# Patient Record
Sex: Female | Born: 1980
Health system: Southern US, Community
[De-identification: ages and names within clinical notes are randomized; demographics above are authoritative.]

## PROBLEM LIST (undated history)

## (undated) ENCOUNTER — Emergency Department: Discharge status: Absent without leave | Payer: Self-pay

## (undated) DIAGNOSIS — F32A Depression, unspecified: Secondary | ICD-10-CM

## (undated) DIAGNOSIS — E559 Vitamin D deficiency, unspecified: Secondary | ICD-10-CM

## (undated) DIAGNOSIS — M549 Dorsalgia, unspecified: Secondary | ICD-10-CM

## (undated) DIAGNOSIS — D649 Anemia, unspecified: Secondary | ICD-10-CM

## (undated) DIAGNOSIS — Z9889 Other specified postprocedural states: Secondary | ICD-10-CM

## (undated) DIAGNOSIS — F419 Anxiety disorder, unspecified: Secondary | ICD-10-CM

## (undated) DIAGNOSIS — R112 Nausea with vomiting, unspecified: Secondary | ICD-10-CM

## (undated) DIAGNOSIS — J45909 Unspecified asthma, uncomplicated: Secondary | ICD-10-CM

## (undated) DIAGNOSIS — K219 Gastro-esophageal reflux disease without esophagitis: Secondary | ICD-10-CM

## (undated) DIAGNOSIS — M797 Fibromyalgia: Secondary | ICD-10-CM

## (undated) DIAGNOSIS — Z973 Presence of spectacles and contact lenses: Secondary | ICD-10-CM

## (undated) DIAGNOSIS — M7989 Other specified soft tissue disorders: Secondary | ICD-10-CM

## (undated) DIAGNOSIS — L309 Dermatitis, unspecified: Secondary | ICD-10-CM

## (undated) DIAGNOSIS — R7303 Prediabetes: Secondary | ICD-10-CM

## (undated) DIAGNOSIS — R0602 Shortness of breath: Secondary | ICD-10-CM

## (undated) DIAGNOSIS — T8859XA Other complications of anesthesia, initial encounter: Secondary | ICD-10-CM

## (undated) DIAGNOSIS — K5900 Constipation, unspecified: Secondary | ICD-10-CM

## (undated) DIAGNOSIS — M1990 Unspecified osteoarthritis, unspecified site: Secondary | ICD-10-CM

## (undated) HISTORY — DX: Other specified soft tissue disorders: M79.89

## (undated) HISTORY — DX: Fibromyalgia: M79.7

## (undated) HISTORY — PX: WISDOM TOOTH EXTRACTION: SHX21

## (undated) HISTORY — DX: Dermatitis, unspecified: L30.9

## (undated) HISTORY — DX: Unspecified osteoarthritis, unspecified site: M19.90

## (undated) HISTORY — PX: UPPER GI ENDOSCOPY: SHX6162

## (undated) HISTORY — DX: Anemia, unspecified: D64.9

## (undated) HISTORY — DX: Constipation, unspecified: K59.00

## (undated) HISTORY — DX: Depression, unspecified: F32.A

## (undated) HISTORY — DX: Dorsalgia, unspecified: M54.9

## (undated) HISTORY — DX: Shortness of breath: R06.02

## (undated) HISTORY — DX: Prediabetes: R73.03

---

## 2016-02-17 ENCOUNTER — Encounter: Payer: Self-pay | Admitting: Emergency Medicine

## 2016-02-17 ENCOUNTER — Emergency Department (INDEPENDENT_AMBULATORY_CARE_PROVIDER_SITE_OTHER)
Admission: EM | Admit: 2016-02-17 | Discharge: 2016-02-17 | Disposition: A | Discharge status: Home / Self Care | Payer: BLUE CROSS/BLUE SHIELD | Source: Home / Self Care | Attending: Family Medicine | Admitting: Family Medicine

## 2016-02-17 DIAGNOSIS — R110 Nausea: Secondary | ICD-10-CM

## 2016-02-17 DIAGNOSIS — B9789 Other viral agents as the cause of diseases classified elsewhere: Secondary | ICD-10-CM

## 2016-02-17 DIAGNOSIS — J069 Acute upper respiratory infection, unspecified: Secondary | ICD-10-CM

## 2016-02-17 HISTORY — DX: Unspecified asthma, uncomplicated: J45.909

## 2016-02-17 MED ORDER — IPRATROPIUM BROMIDE 0.06 % NA SOLN: 2.0000 | Freq: Four times a day (QID) | NASAL | 1 refills | Status: DC

## 2016-02-17 MED ORDER — AZITHROMYCIN 250 MG PO TABS: 250.0000 mg | ORAL_TABLET | Freq: Every day | ORAL | 0 refills | Status: DC

## 2016-02-17 MED ORDER — BENZONATATE 100 MG PO CAPS: 100.0000 mg | ORAL_CAPSULE | Freq: Three times a day (TID) | ORAL | 0 refills | Status: DC

## 2016-02-17 MED ORDER — GUAIFENESIN ER 600 MG PO TB12: 600.0000 mg | ORAL_TABLET | Freq: Two times a day (BID) | ORAL | 0 refills | Status: DC | PRN

## 2016-02-17 MED ORDER — ONDANSETRON HCL 4 MG PO TABS: 4.0000 mg | ORAL_TABLET | Freq: Four times a day (QID) | ORAL | 0 refills | Status: DC

## 2016-02-17 NOTE — ED Triage Notes (Signed)
Dry cough, body aches, congestion, fever, chills x 3 days

## 2016-02-17 NOTE — ED Provider Notes (Signed)
CSN: [id number]     Arrival date & time 02/17/16  1032 History   First MD Initiated Contact with Patient 02/17/16 1213     Chief Complaint  Patient presents with  . URI   (Consider location/radiation/quality/duration/timing/severity/associated sxs/prior Treatment) HPI Kimberly Mckay is a 35 y.o. female presenting to UC with c/o dry cough, body aches, congestion, and subjective fever with chills for 3 days.  She has been taking tylenol with mild relief.  She did get the flu vaccine this year. Several others have been sick around her.  She has had nausea but no vomiting or diarrhea. Hx of asthma but denies chest pain or SOB. She has not been needing to use her inhaler.     Past Medical History:  Diagnosis Date  . Asthma    History reviewed. No pertinent surgical history. No family history on file. Social History  Substance Use Topics  . Smoking status: Never Smoker  . Smokeless tobacco: Never Used  . Alcohol use Yes   OB History    No data available     Review of Systems  Constitutional: Positive for chills and fatigue. Negative for fever.  HENT: Positive for congestion, ear pain, rhinorrhea and sore throat. Negative for trouble swallowing and voice change.   Respiratory: Positive for cough. Negative for shortness of breath.   Cardiovascular: Negative for chest pain and palpitations.  Gastrointestinal: Positive for nausea. Negative for abdominal pain, diarrhea and vomiting.  Musculoskeletal: Positive for arthralgias and myalgias. Negative for back pain.  Skin: Negative for rash.  Neurological: Positive for headaches. Negative for dizziness and light-headedness.    Allergies  Patient has no allergy information on record.  Home Medications   Prior to Admission medications   Medication Sig Start Date End Date Taking? Authorizing Provider  albuterol (ACCUNEB) 0.63 MG/3ML nebulizer solution Take 1 ampule by nebulization every 6 (six) hours as needed for wheezing.   Yes  Historical Provider, MD  ibuprofen (ADVIL,MOTRIN) 600 MG tablet Take 600 mg by mouth every 6 (six) hours as needed.   Yes Historical Provider, MD  azithromycin (ZITHROMAX) 250 MG tablet Take 1 tablet (250 mg total) by mouth daily. Take first 2 tablets together, then 1 every day until finished. 02/17/16   [name], PA-C  benzonatate (TESSALON) 100 MG capsule Take 1-2 capsules (100-200 mg total) by mouth every 8 (eight) hours. 02/17/16   [name], PA-C  guaiFENesin (MUCINEX) 600 MG 12 hr tablet Take 1-2 tablets (600-1,200 mg total) by mouth 2 (two) times daily as needed for to loosen phlegm. 02/17/16   [name], PA-C  ipratropium (ATROVENT) 0.06 % nasal spray Place 2 sprays into both nostrils 4 (four) times daily. 02/17/16   [name], PA-C  ondansetron (ZOFRAN) 4 MG tablet Take 1 tablet (4 mg total) by mouth every 6 (six) hours. 02/17/16   [name], PA-C   Meds Ordered and Administered this Visit  Medications - No data to display  BP 109/74 (BP Location: Left Arm)   Pulse 79   Temp 97.9 F (36.6 C) (Oral)   Ht 5\' 8"  (1.727 m)   Wt 219 lb (99.3 kg)   LMP 02/16/2016 (Exact Date)   SpO2 96%   BMI 33.30 kg/m  No data found.   Physical Exam  Constitutional: She appears well-developed and well-nourished. No distress.  HENT:  Head: Normocephalic and atraumatic.  Right Ear: Tympanic membrane normal.  Left Ear: Tympanic membrane normal.  Nose: Nose normal.  Mouth/Throat: Uvula is midline,  oropharynx is clear and moist and mucous membranes are normal.  Eyes: Conjunctivae are normal. No scleral icterus.  Neck: Normal range of motion. Neck supple.  Cardiovascular: Normal rate, regular rhythm and normal heart sounds.   Pulmonary/Chest: Effort normal and breath sounds normal. No stridor. No respiratory distress. She has no wheezes. She has no rales.  Musculoskeletal: Normal range of motion.  Lymphadenopathy:    She has no cervical adenopathy.  Neurological: She is  alert.  Skin: Skin is warm and dry. She is not diaphoretic.  Nursing note and vitals reviewed.   Urgent Care Course   Clinical Course     Procedures (including critical care time)  Labs Review Labs Reviewed - No data to display  Imaging Review No results found.    MDM   1. Viral URI with cough   2. Nausea    Pt c/o 3 days of URI symptoms. She did receive the flu vaccine.  No evidence of bacterial infection at this time. Encouraged symptomatic treatment.   Rx: Zofran, ipratropium nasal spray, Mucinex, tessalon and prescription to hold with expiration date for Azithromycin to take if not improving in 1 week or persistent fever >100.4*F develops. F/u with PCP in 1 week if not improving. Patient verbalized understanding and agreement with treatment plan.    [name], PA-C 02/17/16 1308

## 2016-02-17 NOTE — Discharge Instructions (Signed)
You may take 400-600mg Ibuprofen (Motrin) every 6-8 hours for fever and pain  °Alternate with Tylenol  °You may take 500mg Tylenol every 4-6 hours as needed for fever and pain  °Follow-up with your primary care provider next week for recheck of symptoms if not improving.  °Be sure to drink plenty of fluids and rest, at least 8hrs of sleep a night, preferably more while you are sick. °Return urgent care or go to closest ER if you cannot keep down fluids/signs of dehydration, fever not reducing with Tylenol, difficulty breathing/wheezing, stiff neck, worsening condition, or other concerns (see below)  ° °Your symptoms are likely due to a virus such as the common cold, however, if you developing worsening chest congestion with shortness of breath, persistent fever for 3 days, or symptoms not improving in 4-5 days, you may fill the antibiotic (azithromycin).  If you do fill the antibiotic,  please take antibiotics as prescribed and be sure to complete entire course even if you start to feel better to ensure infection does not come back. ° °

## 2016-12-27 ENCOUNTER — Encounter: Payer: Self-pay | Admitting: Emergency Medicine

## 2016-12-27 ENCOUNTER — Emergency Department (INDEPENDENT_AMBULATORY_CARE_PROVIDER_SITE_OTHER)
Admission: EM | Admit: 2016-12-27 | Discharge: 2016-12-27 | Disposition: A | Discharge status: Home / Self Care | Payer: BLUE CROSS/BLUE SHIELD | Source: Home / Self Care | Attending: Emergency Medicine | Admitting: Emergency Medicine

## 2016-12-27 DIAGNOSIS — R8281 Pyuria: Secondary | ICD-10-CM

## 2016-12-27 DIAGNOSIS — J0100 Acute maxillary sinusitis, unspecified: Secondary | ICD-10-CM | POA: Diagnosis not present

## 2016-12-27 DIAGNOSIS — R109 Unspecified abdominal pain: Secondary | ICD-10-CM

## 2016-12-27 LAB — POCT INFLUENZA A/B
INFLUENZA A, POC: NEGATIVE
INFLUENZA B, POC: NEGATIVE

## 2016-12-27 LAB — POCT CBC W AUTO DIFF (K'VILLE URGENT CARE)

## 2016-12-27 LAB — POCT URINALYSIS DIP (MANUAL ENTRY)
BILIRUBIN UA: NEGATIVE mg/dL
Bilirubin, UA: NEGATIVE
Glucose, UA: NEGATIVE mg/dL
NITRITE UA: NEGATIVE
PH UA: 5.5 (ref 5.0–8.0)
PROTEIN UA: NEGATIVE mg/dL
Spec Grav, UA: 1.025 (ref 1.010–1.025)
Urobilinogen, UA: 0.2 E.U./dL

## 2016-12-27 LAB — POCT RAPID STREP A (OFFICE): Rapid Strep A Screen: NEGATIVE

## 2016-12-27 LAB — POCT URINE PREGNANCY: PREG TEST UR: NEGATIVE

## 2016-12-27 MED ORDER — AMOXICILLIN 875 MG PO TABS: 875.0000 mg | ORAL_TABLET | Freq: Two times a day (BID) | ORAL | 0 refills | Status: DC

## 2016-12-27 NOTE — Discharge Instructions (Addendum)
Take medications as instructed. Make an appointment to be seen by the GI specialist.

## 2016-12-27 NOTE — ED Triage Notes (Signed)
Pt c/o facial pain and sore throat x2 days. Denies meds for this.

## 2016-12-27 NOTE — ED Provider Notes (Signed)
[facility]    CSN: [id number] Arrival date & time: 12/27/16  0900     History   Chief Complaint Chief Complaint  Patient presents with  . Facial Pain    HPI Kimberly Mckay is a 36 y.o. female.  Patient presents with a 2 day history of sore throat mucous body aches and facial pain. She intermittently has had discolored nasal drainage. She denies having a cough. She is unsure whether she has had a fever. She does feel achy at times. 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 HPI  Past Medical History:  Diagnosis Date  . Asthma     There are no active problems to display for this patient.   History reviewed. No pertinent surgical history.  OB History    No data available       Home Medications    Prior to Admission medications   Medication Sig Start Date End Date Taking? Authorizing Provider  albuterol (ACCUNEB) 0.63 MG/3ML nebulizer solution Take 1 ampule by nebulization every 6 (six) hours as needed for wheezing.    [provider]  azithromycin (ZITHROMAX) 250 MG tablet Take 1 tablet (250 mg total) by mouth daily. Take first 2 tablets together, then 1 every day until finished. 02/17/16   Lurene ShadowPhelps, Erin O, PA-C  benzonatate (TESSALON) 100 MG capsule Take 1-2 capsules (100-200 mg total) by mouth every 8 (eight) hours. 02/17/16   Lurene ShadowPhelps, Erin O, PA-C  guaiFENesin (MUCINEX) 600 MG 12 hr tablet Take 1-2 tablets (600-1,200 mg total) by mouth 2 (two) times daily as needed for to loosen phlegm. 02/17/16   Lurene ShadowPhelps, Erin O, PA-C  ibuprofen (ADVIL,MOTRIN) 600 MG tablet Take 600 mg by mouth every 6 (six) hours as needed.    [provider]  ipratropium (ATROVENT) 0.06 % nasal spray Place 2 sprays into both nostrils 4 (four) times daily. 02/17/16   Lurene ShadowPhelps, Erin O, PA-C  ondansetron (ZOFRAN) 4 MG tablet Take 1 tablet (4 mg total) by mouth every 6 (six) hours. 02/17/16   Lurene ShadowPhelps, Erin O, PA-C    Family  History History reviewed. No pertinent family history.  Social History Social History   Tobacco Use  . Smoking status: Former Smoker    Types: Cigarettes  . Smokeless tobacco: Never Used  Substance Use Topics  . Alcohol use: Yes  . Drug use: No     Allergies   Patient has no allergy information on record.   Review of Systems Review of Systems  Constitutional: Positive for fatigue. Negative for fever.  HENT: Positive for congestion, sinus pressure, sinus pain and sore throat.   Eyes: Negative.   Respiratory: Negative.   Cardiovascular: Negative.   Gastrointestinal: Positive for abdominal pain. Negative for blood in stool, constipation, diarrhea and nausea.  Genitourinary: Negative.      Physical Exam Triage Vital Signs ED Triage Vitals  Enc Vitals Group     BP 12/27/16 0933 93/68     Pulse Rate 12/27/16 0933 68     Resp --      Temp 12/27/16 0933 98.1 F (36.7 C)     Temp Source 12/27/16 0933 Oral     SpO2 12/27/16 0933 98 %     Weight 12/27/16 0933 243 lb (110.2 kg)     Height --      Head Circumference --      Peak Flow --      Pain Score 12/27/16 0934  2     Pain Loc --      Pain Edu? --      Excl. in GC? --    No data found.  Updated Vital Signs BP 124/78 (BP Location: Right Arm)   Pulse 88   Temp 98.1 F (36.7 C) (Oral)   Wt 243 lb (110.2 kg)   LMP 12/07/2016 (Exact Date)   SpO2 98%   BMI 36.95 kg/m   Visual Acuity Right Eye Distance:   Left Eye Distance:   Bilateral Distance:    Right Eye Near:   Left Eye Near:    Bilateral Near:     Physical Exam  Constitutional: She appears well-developed and well-nourished.  HENT:  Nose: Nose normal.  Mouth/Throat: Oropharynx is clear and moist.  Neck: Normal range of motion. Neck supple. No thyromegaly present.  Cardiovascular: Normal rate and regular rhythm.  No murmur heard. Pulmonary/Chest: Effort normal and breath sounds normal. No respiratory distress.  Abdominal:  Abdomen is soft. There  is mild right upper abdominal discomfort and mild left midabdominal discomfort. There is no distention.     UC Treatments / Results  Labs (all labs ordered are listed, but only abnormal results are displayed) Labs Reviewed  POCT URINALYSIS DIP (MANUAL ENTRY) - Abnormal; Notable for the following components:      Result Value   Blood, UA small (*)    Leukocytes, UA Small (1+) (*)    All other components within normal limits  URINE CULTURE  POCT CBC W AUTO DIFF ([facility])  POCT URINE PREGNANCY  POCT RAPID STREP A (OFFICE)  POCT INFLUENZA A/B    EKG  EKG Interpretation None       Radiology No results found.  Procedures Procedures (including critical care time)  Medications Ordered in UC Medications - No data to display   Initial Impression / Assessment and Plan / UC Course  I have reviewed the triage vital signs and the nursing notes.  Pertinent labs & imaging results that were available during my care of the patient were reviewed by me and considered in my medical decision making (see chart for details).   Patient presents with symptoms and signs consistent with acute sinusitis. She will be treated with amoxicillin for 7 days along with Mucinex to take twice a day. She has diffuse abdominal discomfort. She has seen GI before for a stricture and notion she needs to go back. Her white cell count here today is normal. She will make her own appointment with GI. There was small blood and leukocytes on her analysis and culture was sent.    Final Clinical Impressions(s) / UC Diagnoses   Final diagnoses:  Acute non-recurrent maxillary sinusitis  Abdominal pain in female  Pyuria    New Prescriptions This SmartLink is deprecated. Use AVSMEDLIST instead to display the medication list for a patient.   Controlled Substance Prescriptions  Controlled Substance Registry consulted? Not Applicable   Collene Gobble, MD 12/27/16 1225

## 2016-12-28 ENCOUNTER — Telehealth: Payer: Self-pay | Admitting: *Deleted

## 2016-12-28 LAB — URINE CULTURE
MICRO NUMBER:: 81239552
SPECIMEN QUALITY: ADEQUATE

## 2016-12-28 NOTE — Telephone Encounter (Signed)
Spoke to pt given Ucx results. She reports that she is feeling some better today. Advised her if she develops more urinary s/s she could come back to recollect and send another Ucx, but as long as she continues to improve no additional testing is needed. Pt verbalized understanding.

## 2018-11-06 DIAGNOSIS — F331 Major depressive disorder, recurrent, moderate: Secondary | ICD-10-CM | POA: Diagnosis not present

## 2018-11-06 DIAGNOSIS — F411 Generalized anxiety disorder: Secondary | ICD-10-CM | POA: Diagnosis not present

## 2018-11-13 DIAGNOSIS — F411 Generalized anxiety disorder: Secondary | ICD-10-CM | POA: Diagnosis not present

## 2018-11-13 DIAGNOSIS — F331 Major depressive disorder, recurrent, moderate: Secondary | ICD-10-CM | POA: Diagnosis not present

## 2018-11-20 DIAGNOSIS — F411 Generalized anxiety disorder: Secondary | ICD-10-CM | POA: Diagnosis not present

## 2018-11-20 DIAGNOSIS — F331 Major depressive disorder, recurrent, moderate: Secondary | ICD-10-CM | POA: Diagnosis not present

## 2018-11-27 DIAGNOSIS — F411 Generalized anxiety disorder: Secondary | ICD-10-CM | POA: Diagnosis not present

## 2018-11-27 DIAGNOSIS — F331 Major depressive disorder, recurrent, moderate: Secondary | ICD-10-CM | POA: Diagnosis not present

## 2018-11-28 DIAGNOSIS — K6289 Other specified diseases of anus and rectum: Secondary | ICD-10-CM | POA: Diagnosis not present

## 2018-11-28 DIAGNOSIS — K645 Perianal venous thrombosis: Secondary | ICD-10-CM | POA: Diagnosis not present

## 2018-12-04 DIAGNOSIS — F331 Major depressive disorder, recurrent, moderate: Secondary | ICD-10-CM | POA: Diagnosis not present

## 2018-12-04 DIAGNOSIS — F411 Generalized anxiety disorder: Secondary | ICD-10-CM | POA: Diagnosis not present

## 2018-12-11 DIAGNOSIS — F331 Major depressive disorder, recurrent, moderate: Secondary | ICD-10-CM | POA: Diagnosis not present

## 2018-12-11 DIAGNOSIS — F411 Generalized anxiety disorder: Secondary | ICD-10-CM | POA: Diagnosis not present

## 2018-12-18 DIAGNOSIS — F331 Major depressive disorder, recurrent, moderate: Secondary | ICD-10-CM | POA: Diagnosis not present

## 2018-12-18 DIAGNOSIS — F411 Generalized anxiety disorder: Secondary | ICD-10-CM | POA: Diagnosis not present

## 2018-12-25 DIAGNOSIS — F331 Major depressive disorder, recurrent, moderate: Secondary | ICD-10-CM | POA: Diagnosis not present

## 2018-12-25 DIAGNOSIS — F411 Generalized anxiety disorder: Secondary | ICD-10-CM | POA: Diagnosis not present

## 2019-01-01 DIAGNOSIS — F411 Generalized anxiety disorder: Secondary | ICD-10-CM | POA: Diagnosis not present

## 2019-01-01 DIAGNOSIS — F331 Major depressive disorder, recurrent, moderate: Secondary | ICD-10-CM | POA: Diagnosis not present

## 2019-01-08 DIAGNOSIS — F331 Major depressive disorder, recurrent, moderate: Secondary | ICD-10-CM | POA: Diagnosis not present

## 2019-01-08 DIAGNOSIS — F411 Generalized anxiety disorder: Secondary | ICD-10-CM | POA: Diagnosis not present

## 2019-01-25 DIAGNOSIS — F411 Generalized anxiety disorder: Secondary | ICD-10-CM | POA: Diagnosis not present

## 2019-01-25 DIAGNOSIS — F331 Major depressive disorder, recurrent, moderate: Secondary | ICD-10-CM | POA: Diagnosis not present

## 2019-01-29 DIAGNOSIS — F411 Generalized anxiety disorder: Secondary | ICD-10-CM | POA: Diagnosis not present

## 2019-01-29 DIAGNOSIS — F331 Major depressive disorder, recurrent, moderate: Secondary | ICD-10-CM | POA: Diagnosis not present

## 2019-02-05 DIAGNOSIS — F411 Generalized anxiety disorder: Secondary | ICD-10-CM | POA: Diagnosis not present

## 2019-02-05 DIAGNOSIS — F331 Major depressive disorder, recurrent, moderate: Secondary | ICD-10-CM | POA: Diagnosis not present

## 2019-02-26 DIAGNOSIS — F331 Major depressive disorder, recurrent, moderate: Secondary | ICD-10-CM | POA: Diagnosis not present

## 2019-02-26 DIAGNOSIS — F411 Generalized anxiety disorder: Secondary | ICD-10-CM | POA: Diagnosis not present

## 2019-04-23 DIAGNOSIS — U071 COVID-19: Secondary | ICD-10-CM

## 2019-04-23 HISTORY — DX: COVID-19: U07.1

## 2019-05-01 DIAGNOSIS — D1801 Hemangioma of skin and subcutaneous tissue: Secondary | ICD-10-CM | POA: Diagnosis not present

## 2019-05-01 DIAGNOSIS — D485 Neoplasm of uncertain behavior of skin: Secondary | ICD-10-CM | POA: Diagnosis not present

## 2019-05-02 DIAGNOSIS — M791 Myalgia, unspecified site: Secondary | ICD-10-CM | POA: Diagnosis not present

## 2019-05-02 DIAGNOSIS — R439 Unspecified disturbances of smell and taste: Secondary | ICD-10-CM | POA: Diagnosis not present

## 2019-05-02 DIAGNOSIS — U071 COVID-19: Secondary | ICD-10-CM | POA: Diagnosis not present

## 2019-05-02 DIAGNOSIS — R0981 Nasal congestion: Secondary | ICD-10-CM | POA: Diagnosis not present

## 2019-10-24 ENCOUNTER — Emergency Department: Admit: 2019-10-24 | Discharge status: Absent without leave | Payer: Self-pay

## 2019-10-24 ENCOUNTER — Other Ambulatory Visit: Payer: Self-pay

## 2019-10-24 ENCOUNTER — Emergency Department (INDEPENDENT_AMBULATORY_CARE_PROVIDER_SITE_OTHER)
Admission: EM | Admit: 2019-10-24 | Discharge: 2019-10-24 | Disposition: A | Discharge status: Home / Self Care | Payer: BC Managed Care – PPO | Source: Home / Self Care | Attending: Family Medicine | Admitting: Family Medicine

## 2019-10-24 ENCOUNTER — Encounter: Payer: Self-pay | Admitting: Emergency Medicine

## 2019-10-24 DIAGNOSIS — R300 Dysuria: Secondary | ICD-10-CM

## 2019-10-24 DIAGNOSIS — R110 Nausea: Secondary | ICD-10-CM

## 2019-10-24 DIAGNOSIS — N390 Urinary tract infection, site not specified: Secondary | ICD-10-CM

## 2019-10-24 HISTORY — DX: Anxiety disorder, unspecified: F41.9

## 2019-10-24 LAB — POCT URINALYSIS DIP (MANUAL ENTRY)
Bilirubin, UA: NEGATIVE
Glucose, UA: NEGATIVE mg/dL
Ketones, POC UA: NEGATIVE mg/dL
Nitrite, UA: NEGATIVE
Protein Ur, POC: NEGATIVE mg/dL
Spec Grav, UA: 1.025 (ref 1.010–1.025)
Urobilinogen, UA: 0.2 E.U./dL
pH, UA: 5 (ref 5.0–8.0)

## 2019-10-24 MED ORDER — FLUCONAZOLE 200 MG PO TABS: 200.0000 mg | ORAL_TABLET | Freq: Every day | ORAL | 0 refills | Status: AC

## 2019-10-24 MED ORDER — ONDANSETRON HCL 4 MG PO TABS: 4.0000 mg | ORAL_TABLET | Freq: Four times a day (QID) | ORAL | 0 refills | Status: DC

## 2019-10-24 MED ORDER — CEFDINIR 300 MG PO CAPS
300.0000 mg | ORAL_CAPSULE | Freq: Two times a day (BID) | ORAL | 0 refills | Status: DC
Start: 2019-10-24 — End: 2020-03-20

## 2019-10-24 NOTE — ED Triage Notes (Signed)
Patient here with varied symptoms; nausea and some dizziness; back pain; perineal irritation for past 4 days. Had covid in 3/21.

## 2019-10-24 NOTE — ED Provider Notes (Signed)
[facility]    CSN: [id number] Arrival date & time: 10/24/19  1728      History   Chief Complaint Chief Complaint  Patient presents with  . Nausea    HPI Kimberly Mckay is a 39 y.o. female.   Patient complains of nausea and dizziness.  Also has some urinary frequency and dysuria.  She had Covid March of this year.  She was also treated for BV several months ago  HPI  Past Medical History:  Diagnosis Date  . Anxiety   . Asthma   . COVID-19 04/2019    There are no problems to display for this patient.   History reviewed. No pertinent surgical history.  OB History   No obstetric history on file.      Home Medications    Prior to Admission medications   Medication Sig Start Date End Date Taking? Authorizing Provider  ALPRAZolam Prudy Feeler) 0.5 MG tablet Take 0.5 mg by mouth at bedtime as needed for anxiety.   Yes [provider]  albuterol (ACCUNEB) 0.63 MG/3ML nebulizer solution Take 1 ampule by nebulization every 6 (six) hours as needed for wheezing.    [provider]  amoxicillin (AMOXIL) 875 MG tablet Take 1 tablet (875 mg total) 2 (two) times daily by mouth. 12/27/16   Collene Gobble, MD  azithromycin (ZITHROMAX) 250 MG tablet Take 1 tablet (250 mg total) by mouth daily. Take first 2 tablets together, then 1 every day until finished. 02/17/16   Lurene Shadow, PA-C  benzonatate (TESSALON) 100 MG capsule Take 1-2 capsules (100-200 mg total) by mouth every 8 (eight) hours. 02/17/16   Lurene Shadow, PA-C  guaiFENesin (MUCINEX) 600 MG 12 hr tablet Take 1-2 tablets (600-1,200 mg total) by mouth 2 (two) times daily as needed for to loosen phlegm. 02/17/16   Lurene Shadow, PA-C  ibuprofen (ADVIL,MOTRIN) 600 MG tablet Take 600 mg by mouth every 6 (six) hours as needed.    [provider]  ipratropium (ATROVENT) 0.06 % nasal spray Place 2 sprays into both nostrils 4 (four) times daily. 02/17/16   Lurene Shadow, PA-C  ondansetron  (ZOFRAN) 4 MG tablet Take 1 tablet (4 mg total) by mouth every 6 (six) hours. 02/17/16   Lurene Shadow, PA-C    Family History No family history on file.  Social History Social History   Tobacco Use  . Smoking status: Former Smoker    Types: Cigarettes  . Smokeless tobacco: Never Used  Vaping Use  . Vaping Use: Never assessed  Substance Use Topics  . Alcohol use: Yes  . Drug use: No     Allergies   Patient has no allergy information on record.   Review of Systems Review of Systems  Gastrointestinal: Positive for nausea.  Genitourinary: Positive for dysuria and vaginal discharge.  Neurological: Positive for dizziness.  All other systems reviewed and are negative.    Physical Exam Triage Vital Signs ED Triage Vitals  Enc Vitals Group     BP 10/24/19 1825 117/79     Pulse Rate 10/24/19 1825 62     Resp 10/24/19 1825 16     Temp 10/24/19 1825 98.4 F (36.9 C)     Temp Source 10/24/19 1825 Oral     SpO2 10/24/19 1825 96 %     Weight 10/24/19 1826 237 lb (107.5 kg)     Height 10/24/19 1826 5\' 7"  (1.702 m)     Head Circumference --  Peak Flow --      Pain Score 10/24/19 1825 6     Pain Loc --      Pain Edu? --      Excl. in GC? --    No data found.  Updated Vital Signs BP 117/79 (BP Location: Right Arm)   Pulse 62   Temp 98.4 F (36.9 C) (Oral)   Resp 16   Ht 5\' 7"  (1.702 m)   Wt 107.5 kg   LMP 10/17/2019 (Approximate)   SpO2 96%   BMI 37.12 kg/m   Visual Acuity Right Eye Distance:   Left Eye Distance:   Bilateral Distance:    Right Eye Near:   Left Eye Near:    Bilateral Near:     Physical Exam Vitals and nursing note reviewed.  Constitutional:      Appearance: Normal appearance. She is obese.  HENT:     Head: Normocephalic.     Mouth/Throat:     Mouth: Mucous membranes are moist.  Cardiovascular:     Rate and Rhythm: Normal rate and regular rhythm.  Pulmonary:     Effort: Pulmonary effort is normal.     Breath sounds: Normal  breath sounds.  Abdominal:     Palpations: Abdomen is soft.  Neurological:     General: No focal deficit present.     Mental Status: She is alert and oriented to person, place, and time.      UC Treatments / Results  Labs (all labs ordered are listed, but only abnormal results are displayed) Labs Reviewed  POCT URINALYSIS DIP (MANUAL ENTRY) - Abnormal; Notable for the following components:      Result Value   Clarity, UA cloudy (*)    Blood, UA trace-lysed (*)    Leukocytes, UA Small (1+) (*)    All other components within normal limits  URINE CULTURE    EKG   Radiology No results found.  Procedures Procedures (including critical care time)  Medications Ordered in UC Medications - No data to display  Initial Impression / Assessment and Plan / UC Course  I have reviewed the triage vital signs and the nursing notes.  Pertinent labs & imaging results that were available during my care of the patient were reviewed by me and considered in my medical decision making (see chart for details).     UTI.  We will culture urine and begin Omnicef pending results of culture There is some tenderness of the sinuses and drainage which might become attributing and affect are related to the nausea. Final Clinical Impressions(s) / UC Diagnoses   Final diagnoses:  Dysuria   Discharge Instructions   None    ED Prescriptions    None     PDMP not reviewed this encounter.   10/19/2019, MD 10/24/19 12/24/19

## 2019-10-26 LAB — URINE CULTURE
MICRO NUMBER:: 10904819
SPECIMEN QUALITY:: ADEQUATE

## 2019-10-30 ENCOUNTER — Telehealth: Payer: Self-pay

## 2019-10-30 DIAGNOSIS — Z124 Encounter for screening for malignant neoplasm of cervix: Secondary | ICD-10-CM | POA: Diagnosis not present

## 2019-10-30 DIAGNOSIS — N898 Other specified noninflammatory disorders of vagina: Secondary | ICD-10-CM | POA: Diagnosis not present

## 2019-10-30 DIAGNOSIS — R300 Dysuria: Secondary | ICD-10-CM | POA: Diagnosis not present

## 2019-10-30 NOTE — Telephone Encounter (Signed)
Returning pts VM left at [facility].. Wanted ucx results since she finished antibiotics and is still feeling bad. Advised to f/u with PCP for recheck of sxs. Pt acknowledges.

## 2019-11-17 DIAGNOSIS — L237 Allergic contact dermatitis due to plants, except food: Secondary | ICD-10-CM | POA: Diagnosis not present

## 2020-01-23 DIAGNOSIS — M5442 Lumbago with sciatica, left side: Secondary | ICD-10-CM | POA: Diagnosis not present

## 2020-01-23 DIAGNOSIS — M7918 Myalgia, other site: Secondary | ICD-10-CM | POA: Diagnosis not present

## 2020-01-23 DIAGNOSIS — M9904 Segmental and somatic dysfunction of sacral region: Secondary | ICD-10-CM | POA: Diagnosis not present

## 2020-01-23 DIAGNOSIS — M9905 Segmental and somatic dysfunction of pelvic region: Secondary | ICD-10-CM | POA: Diagnosis not present

## 2020-01-23 DIAGNOSIS — M9903 Segmental and somatic dysfunction of lumbar region: Secondary | ICD-10-CM | POA: Diagnosis not present

## 2020-02-11 DIAGNOSIS — M5459 Other low back pain: Secondary | ICD-10-CM | POA: Diagnosis not present

## 2020-03-17 ENCOUNTER — Ambulatory Visit (INDEPENDENT_AMBULATORY_CARE_PROVIDER_SITE_OTHER): Payer: BC Managed Care – PPO | Admitting: Family Medicine

## 2020-03-20 ENCOUNTER — Other Ambulatory Visit: Payer: Self-pay

## 2020-03-20 ENCOUNTER — Encounter (INDEPENDENT_AMBULATORY_CARE_PROVIDER_SITE_OTHER): Payer: Self-pay | Admitting: Family Medicine

## 2020-03-20 ENCOUNTER — Ambulatory Visit (INDEPENDENT_AMBULATORY_CARE_PROVIDER_SITE_OTHER): Payer: BC Managed Care – PPO | Admitting: Family Medicine

## 2020-03-20 VITALS — BP 109/77 | HR 60 | Temp 97.9°F | Ht 67.0 in | Wt 254.0 lb

## 2020-03-20 DIAGNOSIS — K5909 Other constipation: Secondary | ICD-10-CM

## 2020-03-20 DIAGNOSIS — R0602 Shortness of breath: Secondary | ICD-10-CM | POA: Diagnosis not present

## 2020-03-20 DIAGNOSIS — Z9189 Other specified personal risk factors, not elsewhere classified: Secondary | ICD-10-CM

## 2020-03-20 DIAGNOSIS — D649 Anemia, unspecified: Secondary | ICD-10-CM | POA: Diagnosis not present

## 2020-03-20 DIAGNOSIS — Z6839 Body mass index (BMI) 39.0-39.9, adult: Secondary | ICD-10-CM

## 2020-03-20 DIAGNOSIS — F39 Unspecified mood [affective] disorder: Secondary | ICD-10-CM | POA: Diagnosis not present

## 2020-03-20 DIAGNOSIS — R7303 Prediabetes: Secondary | ICD-10-CM

## 2020-03-20 DIAGNOSIS — K5900 Constipation, unspecified: Secondary | ICD-10-CM | POA: Diagnosis not present

## 2020-03-20 DIAGNOSIS — R5383 Other fatigue: Secondary | ICD-10-CM | POA: Diagnosis not present

## 2020-03-20 DIAGNOSIS — Z0289 Encounter for other administrative examinations: Secondary | ICD-10-CM

## 2020-03-21 LAB — CBC WITH DIFFERENTIAL/PLATELET
Basophils Absolute: 0 10*3/uL (ref 0.0–0.2)
Basos: 1 %
EOS (ABSOLUTE): 0.1 10*3/uL (ref 0.0–0.4)
Eos: 2 %
Hematocrit: 41.7 % (ref 34.0–46.6)
Hemoglobin: 13.9 g/dL (ref 11.1–15.9)
Immature Grans (Abs): 0 10*3/uL (ref 0.0–0.1)
Immature Granulocytes: 0 %
Lymphocytes Absolute: 1.1 10*3/uL (ref 0.7–3.1)
Lymphs: 24 %
MCH: 28.3 pg (ref 26.6–33.0)
MCHC: 33.3 g/dL (ref 31.5–35.7)
MCV: 85 fL (ref 79–97)
Monocytes Absolute: 0.6 10*3/uL (ref 0.1–0.9)
Monocytes: 12 %
Neutrophils Absolute: 2.9 10*3/uL (ref 1.4–7.0)
Neutrophils: 61 %
Platelets: 201 10*3/uL (ref 150–450)
RBC: 4.92 x10E6/uL (ref 3.77–5.28)
RDW: 13.5 % (ref 11.7–15.4)
WBC: 4.7 10*3/uL (ref 3.4–10.8)

## 2020-03-21 LAB — COMPREHENSIVE METABOLIC PANEL
ALT: 8 IU/L (ref 0–32)
AST: 18 IU/L (ref 0–40)
Albumin/Globulin Ratio: 1.2 (ref 1.2–2.2)
Albumin: 3.7 g/dL — ABNORMAL LOW (ref 3.8–4.8)
Alkaline Phosphatase: 101 IU/L (ref 44–121)
BUN/Creatinine Ratio: 14 (ref 9–23)
BUN: 11 mg/dL (ref 6–20)
Bilirubin Total: <0.2 mg/dL (ref 0.0–1.2)
CO2: 24 mmol/L (ref 20–29)
Calcium: 9 mg/dL (ref 8.7–10.2)
Chloride: 104 mmol/L (ref 96–106)
Creatinine, Ser: 0.81 mg/dL (ref 0.57–1.00)
GFR calc Af Amer: 107 mL/min/{1.73_m2} (ref >59)
GFR calc non Af Amer: 92 mL/min/{1.73_m2} (ref >59)
Globulin, Total: 3 g/dL (ref 1.5–4.5)
Glucose: 78 mg/dL (ref 65–99)
Potassium: 4.1 mmol/L (ref 3.5–5.2)
Sodium: 140 mmol/L (ref 134–144)
Total Protein: 6.7 g/dL (ref 6.0–8.5)

## 2020-03-21 LAB — LIPID PANEL
Chol/HDL Ratio: 2.8 ratio (ref 0.0–4.4)
Cholesterol, Total: 153 mg/dL (ref 100–199)
HDL: 55 mg/dL (ref >39)
LDL Chol Calc (NIH): 84 mg/dL (ref 0–99)
Triglycerides: 72 mg/dL (ref 0–149)
VLDL Cholesterol Cal: 14 mg/dL (ref 5–40)

## 2020-03-21 LAB — VITAMIN B12: Vitamin B-12: 353 pg/mL (ref 232–1245)

## 2020-03-21 LAB — VITAMIN D 25 HYDROXY (VIT D DEFICIENCY, FRACTURES): Vit D, 25-Hydroxy: 19.7 ng/mL — ABNORMAL LOW (ref 30.0–100.0)

## 2020-03-21 LAB — HEMOGLOBIN A1C
Est. average glucose Bld gHb Est-mCnc: 111 mg/dL
Hgb A1c MFr Bld: 5.5 % (ref 4.8–5.6)

## 2020-03-21 LAB — TSH: TSH: 1.77 u[IU]/mL (ref 0.450–4.500)

## 2020-03-21 LAB — INSULIN, RANDOM: INSULIN: 16.2 u[IU]/mL (ref 2.6–24.9)

## 2020-03-21 LAB — T4, FREE: Free T4: 1.22 ng/dL (ref 0.82–1.77)

## 2020-03-21 LAB — T3: T3, Total: 114 ng/dL (ref 71–180)

## 2020-03-21 LAB — FOLATE: Folate: 4.6 ng/mL (ref >3.0)

## 2020-03-24 NOTE — Progress Notes (Signed)
Dear Dr. Venita Lick,   Thank you for referring Chihiro Ganzer to our clinic. The following note includes my evaluation and treatment recommendations.  Chief Complaint:   OBESITY Kimberly Mckay (MR# [id number]) is a 40 y.o. female who presents for evaluation and treatment of obesity and related comorbidities. Current BMI is Body mass index is 39.78 kg/m. Reyanna has been struggling with her weight for many years and has been unsuccessful in either losing weight, maintaining weight loss, or reaching her healthy weight goal.  Bethyl is currently in the action stage of change and ready to dedicate time achieving and maintaining a healthier weight. Jonay is interested in becoming our patient and working on intensive lifestyle modifications including (but not limited to) diet and exercise for weight loss.  Ahlana works full-time. She lives with her husband, Viviann Spare, and 2 kids, ages 81 and 68. She eats out 5 days a week. She craves carbs, cookies, chocolate, chips, and pizza. She skips breakfast 5 days a week on average. I feel hungry all the time.  Ellan's habits were reviewed today and are as follows: Her family eats meals together, her desired weight loss is 104 lbs, she started gaining weight after high school, her heaviest weight ever was 270 pounds, she has significant food cravings issues, she snacks frequently in the evenings, she skips meals frequently, she is frequently drinking liquids with calories, she frequently makes poor food choices, she has problems with excessive hunger, she frequently eats larger portions than normal, she has binge eating behaviors and she struggles with emotional eating.  This is the patient's first visit at [facility].  The patient's NEW PATIENT PACKET that they filled out prior to today's office visit was reviewed at length and information from that paperwork was included within the following office visit note.     Included in the packet: current and past health history, medications, allergies, ROS, gynecologic history (women only), surgical history, family history, social history, weight history, weight loss surgery history (for those that have had weight loss surgery), nutritional evaluation, mood and food questionnaire along with a depression screening (PHQ9) on all patients, an Epworth questionnaire, sleep habits questionnaire, patient life and health improvement goals questionnaire. These will all be scanned into the patient's chart under media.   During the visit, I independently reviewed the patient's EKG, bioimpedance scale results, and indirect calorimeter results. I used this information to tailor a meal plan for the patient that will help Zuriah to lose weight and will improve her obesity-related conditions going forward.  I performed a medically necessary appropriate examination and/or evaluation. I discussed the assessment and treatment plan with the patient. The patient was provided an opportunity to ask questions and all were answered. The patient agreed with the plan and demonstrated an understanding of the instructions. Labs were ordered today (unless patient declined them) and will be reviewed with the patient at our next visit unless more critical results need to be addressed immediately. Clinical information was updated and documented in the EMR.  Time spent on visit including pre-visit chart review and post-visit care was estimated to be 60 minutes  Depression Screen Lynde's Food and Mood (modified PHQ-9) score was 23.  Depression screen [facility] 2/9 03/20/2020  Decreased Interest 3  Down, Depressed, Hopeless 3  PHQ - 2 Score 6  Altered sleeping 2  Tired, decreased energy 3  Change in appetite 3  Feeling bad or failure about yourself  3  Trouble concentrating 3  Moving slowly or fidgety/restless 3  Suicidal thoughts 0  PHQ-9 Score 23  Difficult doing work/chores Very difficult     Assessment/Plan:   1. Other fatigue Min admits to daytime somnolence and admits to waking up still tired. Patent has a history of symptoms of daytime fatigue and morning headache. Loreal generally gets 6 hours of sleep per night, and states that she has poor sleep quality. Snoring is present. Apneic episodes are present. Epworth Sleepiness Score is 22.  Plan: Check labs. Teegan does feel that her weight is causing her energy to be lower than it should be. Fatigue may be related to obesity, depression or many other causes. Labs will be ordered, and in the meanwhile, Felice will focus on self care including making healthy food choices, increasing physical activity and focusing on stress reduction.  Lab/Orders today or future: - EKG 12-Lead - Vitamin B12 - CBC with Differential/Platelet - Comprehensive metabolic panel - Folate - Hemoglobin A1c - Insulin, random - Lipid panel - T3 - T4, free - TSH - VITAMIN D 25 Hydroxy (Vit-D Deficiency, Fractures)  2. SOBOE (shortness of breath on exertion) Wilson notes increasing shortness of breath with exercising and seems to be worsening over time with weight gain. She notes getting out of breath sooner with activity than she used to. This has gotten worse recently. Darlyne denies shortness of breath at rest or orthopnea.  Plan: Check labs. Deysha does feel that she gets out of breath more easily that she used to when she exercises. Shevy's shortness of breath appears to be obesity related and exercise induced. She has agreed to work on weight loss and gradually increase exercise to treat her exercise induced shortness of breath. Will continue to monitor closely.  Lab/Orders today or future: - Vitamin B12 - CBC with Differential/Platelet - Comprehensive metabolic panel - Folate - Hemoglobin A1c - Insulin, random - Lipid panel - T3 - T4, free - TSH - VITAMIN D 25 Hydroxy (Vit-D Deficiency, Fractures)  3.  Pre-diabetes Roderick is not on meds. She has a diagnosis of prediabetes based on her elevated HgA1c and was informed this puts her at greater risk of developing diabetes. She continues to work on diet and exercise to decrease her risk of diabetes. She denies nausea or hypoglycemia.  Lab Results  Component Value Date   HGBA1C 5.5 03/20/2020   Lab Results  Component Value Date   INSULIN 16.2 03/20/2020   Plan: Check labs. Malak will continue to work on weight loss, exercise, and decreasing simple carbohydrates to help decrease the risk of diabetes.   Lab/Orders today or future: - Vitamin B12 - CBC with Differential/Platelet - Comprehensive metabolic panel - Folate - Hemoglobin A1c - Insulin, random - Lipid panel - T3 - T4, free - TSH - VITAMIN D 25 Hydroxy (Vit-D Deficiency, Fractures)  4. Mood disorder (HCC)- emotional eating Hera has depression and generalized anxiety disorder. Her PHQ-9 score is 23. She is on Xanax prn. Zondra is struggling with emotional eating and using food for comfort to the extent that it is negatively impacting her health. She has been working on behavior modification techniques to help reduce her emotional eating. She shows no sign of suicidal or homicidal ideations.  Plan: Check labs. Behavior modification techniques were discussed today to help Takya deal with her emotional/non-hunger eating behaviors.  Orders and follow up as documented in patient record.   Lab/Orders today or future: - Vitamin B12 - CBC with Differential/Platelet - Comprehensive metabolic panel - Folate -  Hemoglobin A1c - Insulin, random - Lipid panel - T3 - T4, free - TSH - VITAMIN D 25 Hydroxy (Vit-D Deficiency, Fractures)  5. Anemia, unspecified type Cleone is not a vegetarian.  She does not have a history of weight loss surgery. She is not on a supplement.  CBC Latest Ref Rng & Units 03/20/2020  WBC 3.4 - 10.8 x10E3/uL 4.7  Hemoglobin 11.1 - 15.9  g/dL 27.0  Hematocrit 35.0 - 46.6 % 41.7  Platelets 150 - 450 x10E3/uL 201   No results found for: IRON, TIBC, FERRITIN  Plan: The diagnosis was reviewed with the patient. Counseling provided today, see below. We will continue to monitor. Orders and follow up as documented in patient record.  Counseling . The body needs vitamin B12: to make red blood cells; to make DNA; and to help the nerves work properly so they can carry messages from the brain to the body.  . The main causes of vitamin B12 deficiency include dietary deficiency, digestive diseases, pernicious anemia, and having a surgery in which part of the stomach or small intestine is removed.  . Certain medicines can make it harder for the body to absorb vitamin B12. These medicines include: heartburn medications; some antibiotics; some medications used to treat diabetes, gout, and high cholesterol.  . In some cases, there are no symptoms of this condition. If the condition leads to anemia or nerve damage, various symptoms can occur, such as weakness or fatigue, shortness of breath, and numbness or tingling in your hands and feet.   . Treatment:  o May include taking vitamin B12 supplements.  o Avoid alcohol.  o Eat lots of healthy foods that contain vitamin B12: - Beef, pork, chicken, [place], and organ meats, such as liver.  - Seafood: This includes clams, rainbow trout, salmon, tuna, and haddock. Eggs.  - Cereal and dairy products that are fortified: This means that vitamin B12 has been added to the food.    Lab/Orders today or future: - Vitamin B12 - CBC with Differential/Platelet - Comprehensive metabolic panel - Folate - Hemoglobin A1c - Insulin, random - Lipid panel - T3 - T4, free - TSH - VITAMIN D 25 Hydroxy (Vit-D Deficiency, Fractures)  6. Other constipation Inanna notes constipation. Constipation is not currently an issue for her.  Plan: Emmogene was informed that a decrease in bowel movement frequency is  normal while losing weight, but stools should not be hard or painful. Orders and follow up as documented in patient record.   Counseling Getting to Good Bowel Health: Your goal is to have one soft bowel movement each day. Drink at least 8 glasses of water each day. Eat plenty of fiber (goal is over 25 grams each day). It is best to get most of your fiber from dietary sources which includes leafy green vegetables, fresh fruit, and whole grains. You may need to add fiber with the help of OTC fiber supplements. These include Metamucil, Citrucel, and Flaxseed. If you are still having trouble, try adding Miralax or Magnesium Citrate. If all of these changes do not work, Dietitian.  Lab/Orders today or future: - Vitamin B12 - CBC with Differential/Platelet - Comprehensive metabolic panel - Folate - Hemoglobin A1c - Insulin, random - Lipid panel - T3 - T4, free - TSH - VITAMIN D 25 Hydroxy (Vit-D Deficiency, Fractures)  7. At risk for impaired metabolic function Linnae was given approximately 30 minutes of impaired  metabolic function prevention counseling today. We discussed intensive lifestyle modifications  today with an emphasis on specific nutrition and exercise instructions and strategies.   8. Class 2 severe obesity with serious comorbidity and body mass index (BMI) of 39.0 to 39.9 in adult, unspecified obesity type (HCC) Sumaya is currently in the action stage of change and her goal is to continue with weight loss efforts. I recommend Sathvika begin the structured treatment plan as follows:  She has agreed to the Category 3 Plan.  Exercise goals: As is   Behavioral modification strategies: meal planning and cooking strategies and planning for success.  She was informed of the importance of frequent follow-up visits to maximize her success with intensive lifestyle modifications for her multiple health conditions. She was informed we would discuss her lab results at  her next visit unless there is a critical issue that needs to be addressed sooner. Mea agreed to keep her next visit at the agreed upon time to discuss these results.  Objective:   Blood pressure 109/77, pulse 60, temperature 97.9 F (36.6 C), height 5\' 7"  (1.702 m), weight 254 lb (115.2 kg), last menstrual period 03/08/2020, SpO2 99 %. Body mass index is 39.78 kg/m.  EKG: Normal sinus rhythm, rate 67.  Indirect Calorimeter completed today shows a VO2 of 325 and a REE of 2262.  Her calculated basal metabolic rate is 2263 thus her basal metabolic rate is better than expected.  General: Cooperative, alert, well developed, in no acute distress. HEENT: Conjunctivae and lids unremarkable. Cardiovascular: Regular rhythm.  Lungs: Normal work of breathing. Neurologic: No focal deficits.   Lab Results  Component Value Date   CREATININE 0.81 03/20/2020   BUN 11 03/20/2020   NA 140 03/20/2020   K 4.1 03/20/2020   CL 104 03/20/2020   CO2 24 03/20/2020   Lab Results  Component Value Date   ALT 8 03/20/2020   AST 18 03/20/2020   ALKPHOS 101 03/20/2020   BILITOT <0.2 03/20/2020   Lab Results  Component Value Date   HGBA1C 5.5 03/20/2020   Lab Results  Component Value Date   INSULIN 16.2 03/20/2020   Lab Results  Component Value Date   TSH 1.770 03/20/2020   Lab Results  Component Value Date   CHOL 153 03/20/2020   HDL 55 03/20/2020   LDLCALC 84 03/20/2020   TRIG 72 03/20/2020   CHOLHDL 2.8 03/20/2020   Lab Results  Component Value Date   WBC 4.7 03/20/2020   HGB 13.9 03/20/2020   HCT 41.7 03/20/2020   MCV 85 03/20/2020   PLT 201 03/20/2020    Attestation Statements:   Reviewed by clinician on day of visit: allergies, medications, problem list, medical history, surgical history, family history, social history, and previous encounter notes.  03/22/2020, am acting as Edmund Hilda for Energy manager, DO.  I have reviewed the above documentation for  accuracy and completeness, and I agree with the above. [employer], D.O.  The 21st Century Cures Act was signed into law in 2016 which includes the topic of electronic health records.  This provides immediate access to information in MyChart.  This includes consultation notes, operative notes, office notes, lab results and pathology reports.  If you have any questions about what you read please let 2017 know at your next visit so we can discuss your concerns and take corrective action if need be.  We are right here with you.

## 2020-03-31 ENCOUNTER — Ambulatory Visit (INDEPENDENT_AMBULATORY_CARE_PROVIDER_SITE_OTHER): Payer: BC Managed Care – PPO | Admitting: Family Medicine

## 2020-04-02 ENCOUNTER — Encounter (INDEPENDENT_AMBULATORY_CARE_PROVIDER_SITE_OTHER): Payer: Self-pay | Admitting: Family Medicine

## 2020-04-02 ENCOUNTER — Other Ambulatory Visit: Payer: Self-pay

## 2020-04-02 ENCOUNTER — Ambulatory Visit (INDEPENDENT_AMBULATORY_CARE_PROVIDER_SITE_OTHER): Payer: BC Managed Care – PPO | Admitting: Family Medicine

## 2020-04-02 VITALS — BP 122/79 | HR 61 | Temp 98.6°F | Ht 67.0 in | Wt 253.0 lb

## 2020-04-02 DIAGNOSIS — Z6839 Body mass index (BMI) 39.0-39.9, adult: Secondary | ICD-10-CM

## 2020-04-02 DIAGNOSIS — E559 Vitamin D deficiency, unspecified: Secondary | ICD-10-CM | POA: Insufficient documentation

## 2020-04-02 DIAGNOSIS — Z9189 Other specified personal risk factors, not elsewhere classified: Secondary | ICD-10-CM

## 2020-04-02 DIAGNOSIS — R7303 Prediabetes: Secondary | ICD-10-CM

## 2020-04-02 DIAGNOSIS — D508 Other iron deficiency anemias: Secondary | ICD-10-CM | POA: Diagnosis not present

## 2020-04-02 DIAGNOSIS — F39 Unspecified mood [affective] disorder: Secondary | ICD-10-CM

## 2020-04-02 DIAGNOSIS — K5909 Other constipation: Secondary | ICD-10-CM | POA: Diagnosis not present

## 2020-04-02 DIAGNOSIS — D649 Anemia, unspecified: Secondary | ICD-10-CM | POA: Insufficient documentation

## 2020-04-02 MED ORDER — VITAMIN D (ERGOCALCIFEROL) 1.25 MG (50000 UNIT) PO CAPS: 50000.0000 [IU] | ORAL_CAPSULE | ORAL | 0 refills | Status: DC

## 2020-04-07 NOTE — Progress Notes (Signed)
Chief Complaint:   OBESITY Kimberly Mckay is here to discuss her progress with her obesity treatment plan along with follow-up of her obesity related diagnoses.   Today's visit was #: 2 Starting weight: 254 lbs Starting date: 03/20/2020 Today's weight: 253 lbs Today's date: 04/02/2020 Total lbs lost to date: 1 lb Body mass index is 39.63 kg/m.  Total weight loss percentage to date: -0.39%  Interim History:  Kimberly Mckay is here today to review her NEW Meal Plan and to discuss all recent labs done here and/ or done at outside facilities. This is patient's first follow up visit. Extended time was spent counseling Kimberly Mckay on all new disease processes that were discovered or that are worsening.   Kimberly Mckay says she is always hungry between 12 and 5 pm and after dinner.  She does not like several foods.  She ate all foods on plan and was "starving", so she ate additional food (Doritos, candy, etc.).  She did not calculate 300 calories of snacks (these were in addition).  Nutrition Plan: Category 3 Plan for 75% of the time. Activity: None at this time.  Assessment/Plan:   1. Prediabetes Discussed labs with patient today.  At goal. Goal is HgbA1c < 5.7.  Medication: None.  Elevated fasting insulin.  She craves carbs often since her diet is historically rich in carbs, and they are difficult to give up.    Plan:  Consider metformin in the future.  Handouts given and counseling done.  She will continue to focus on protein-rich, low simple carbohydrate foods. We reviewed the importance of hydration, regular exercise for stress reduction, and restorative sleep.   Lab Results  Component Value Date   HGBA1C 5.5 03/20/2020   Lab Results  Component Value Date   INSULIN 16.2 03/20/2020   2. Vitamin D deficiency Discussed labs with patient today.  Not at goal. Current vitamin D is 19.7, tested on 03/20/2020. Optimal goal > 50 ng/dL.   Plan: Start to take prescription Vitamin D  @50 ,000 IU every week as prescribed.  Follow-up for routine testing of Vitamin D, at least 2-3 times per year to avoid over-replacement.  - Start Vitamin D, Ergocalciferol, (DRISDOL) 1.25 MG (50000 UNIT) CAPS capsule; Take 1 capsule (50,000 Units total) by mouth every 7 (seven) days.  Dispense: 4 capsule; Refill: 0  3. Other iron deficiency anemia Discussed labs with patient today.  She is not on an iron supplement/medication.   Labs are stable.  Nutrition: Iron-rich foods include dark leafy greens, red and white meats, eggs, seafood, and beans.  Certain foods and drinks prevent your body from absorbing iron properly. Avoid eating these foods in the same meal as iron-rich foods or with iron supplements. These foods include: coffee, black tea, and red wine; milk, dairy products, and foods that are high in calcium; beans and soybeans; whole grains. Constipation can be a side effect of iron supplementation. Increased water and fiber intake are helpful. Water goal: > 2 liters/day. Fiber goal: > 25 grams/day.  4. Other constipation Discussed labs with patient today.  Kimberly Mckay denies acute concerns.      Plan:  Labs stable.  Increase water intake.  Kimberly Mckay was informed that a decrease in bowel movement frequency is normal while losing weight, but stools should not be hard or painful.    Counseling: Getting to Good Bowel Health: Your goal is to have one soft bowel movement each day. Drink at least 8 glasses of water each day. Eat plenty  of fiber (goal is over 30 grams each day). It is best to get most of your fiber from dietary sources which includes leafy green vegetables, fresh fruit, and whole grains. You may need to add fiber with the help of OTC fiber supplements. These include Metamucil, Citrucel, and Benefiber. If you are still having trouble, try adding an osmotic laxative such as Miralax. If all of these changes do not work, Dietitian.  5. Mood disorder (HCC)- emotional  eating Discussed labs with patient today.  At last office visit, we discussed she follow-up with her PCP's office and her counselor.  She has not been able to do so.  Medication: Xanax per PCP.  Plan:  Recommend follow-up with PCP regarding Xanax and medication management.  Recommend CBT with current counselor or a new one.  Also check EAP at work.  Recommend walking and other stress management techniques.  Reassured her about her labs.  6. At risk for diabetes mellitus - Kimberly Mckay was given diabetes prevention education and counseling today of more than 26 minutes.  - Counseled patient on pathophysiology of disease and meaning/ implication of lab results.  - Reviewed how certain foods can either stimulate or inhibit insulin release, and subsequently affect hunger pathways  - Importance of following a healthy meal plan with limiting amounts of simple carbohydrates discussed with patient - Effects of regular aerobic exercise on blood sugar regulation reviewed and encouraged an eventual goal of 30 min 5d/week or more as a minimum.  - Briefly discussed treatment options, which always include dietary and lifestyle modification as first line.   - Handouts provided at patient's desire and/or told to go online to the American Diabetes Association website for further information.  7. Class 2 severe obesity with serious comorbidity and body mass index (BMI) of 39.0 to 39.9 in adult, unspecified obesity type (HCC)  Course: Kimberly Mckay is currently in the action stage of change. As such, her goal is to continue with weight loss efforts.   Nutrition goals: She has agreed to keeping a food journal and adhering to recommended goals of 1900 calories and 120 grams of protein.   Exercise goals: No exercise has been prescribed at this time.  Behavioral modification strategies: increasing lean protein intake, decreasing simple carbohydrates, increasing water intake, meal planning and cooking strategies, ways to  avoid night time snacking, better snacking choices, emotional eating strategies, avoiding temptations, planning for success and keeping a strict food journal.  Kimberly Mckay has agreed to follow-up with our clinic in 2 weeks with an APP. She was informed of the importance of frequent follow-up visits to maximize her success with intensive lifestyle modifications for her multiple health conditions.   Objective:   Blood pressure 122/79, pulse 61, temperature 98.6 F (37 C), height 5\' 7"  (1.702 m), weight 253 lb (114.8 kg), last menstrual period 03/08/2020, SpO2 98 %. Body mass index is 39.63 kg/m.  General: Cooperative, alert, well developed, in no acute distress. HEENT: Conjunctivae and lids unremarkable. Cardiovascular: Regular rhythm.  Lungs: Normal work of breathing. Neurologic: No focal deficits.   Lab Results  Component Value Date   CREATININE 0.81 03/20/2020   BUN 11 03/20/2020   NA 140 03/20/2020   K 4.1 03/20/2020   CL 104 03/20/2020   CO2 24 03/20/2020   Lab Results  Component Value Date   ALT 8 03/20/2020   AST 18 03/20/2020   ALKPHOS 101 03/20/2020   BILITOT <0.2 03/20/2020   Lab Results  Component Value Date  HGBA1C 5.5 03/20/2020   Lab Results  Component Value Date   INSULIN 16.2 03/20/2020   Lab Results  Component Value Date   TSH 1.770 03/20/2020   Lab Results  Component Value Date   CHOL 153 03/20/2020   HDL 55 03/20/2020   LDLCALC 84 03/20/2020   TRIG 72 03/20/2020   CHOLHDL 2.8 03/20/2020   Lab Results  Component Value Date   WBC 4.7 03/20/2020   HGB 13.9 03/20/2020   HCT 41.7 03/20/2020   MCV 85 03/20/2020   PLT 201 03/20/2020   Attestation Statements:   Reviewed by clinician on day of visit: allergies, medications, problem list, medical history, surgical history, family history, social history, and previous encounter notes.  I, Insurance claims handler, CMA, am acting as Energy manager for [employer], DO.  I have reviewed the above  documentation for accuracy and completeness, and I agree with the above. Carlye Grippe, D.O.  The 21st Century Cures Act was signed into law in 2016 which includes the topic of electronic health records.  This provides immediate access to information in MyChart.  This includes consultation notes, operative notes, office notes, lab results and pathology reports.  If you have any questions about what you read please let us know at your next visit so we can discuss your concerns and take corrective action if need be.  We are right here with you.

## 2020-04-15 ENCOUNTER — Encounter (INDEPENDENT_AMBULATORY_CARE_PROVIDER_SITE_OTHER): Payer: Self-pay | Admitting: Adult Health

## 2020-04-15 ENCOUNTER — Ambulatory Visit (INDEPENDENT_AMBULATORY_CARE_PROVIDER_SITE_OTHER): Payer: BC Managed Care – PPO | Admitting: Adult Health

## 2020-04-15 ENCOUNTER — Telehealth (INDEPENDENT_AMBULATORY_CARE_PROVIDER_SITE_OTHER): Payer: Self-pay | Admitting: Adult Health

## 2020-04-15 ENCOUNTER — Other Ambulatory Visit: Payer: Self-pay

## 2020-04-15 VITALS — BP 145/77 | HR 61 | Temp 98.0°F

## 2020-04-15 DIAGNOSIS — Z9189 Other specified personal risk factors, not elsewhere classified: Secondary | ICD-10-CM | POA: Diagnosis not present

## 2020-04-15 DIAGNOSIS — Z6841 Body Mass Index (BMI) 40.0 and over, adult: Secondary | ICD-10-CM | POA: Diagnosis not present

## 2020-04-15 DIAGNOSIS — E8881 Metabolic syndrome: Secondary | ICD-10-CM | POA: Diagnosis not present

## 2020-04-15 DIAGNOSIS — E88819 Insulin resistance, unspecified: Secondary | ICD-10-CM | POA: Insufficient documentation

## 2020-04-15 DIAGNOSIS — E559 Vitamin D deficiency, unspecified: Secondary | ICD-10-CM

## 2020-04-15 MED ORDER — METFORMIN HCL 500 MG PO TABS: ORAL_TABLET | ORAL | 0 refills | Status: DC

## 2020-04-15 MED ORDER — VITAMIN D (ERGOCALCIFEROL) 1.25 MG (50000 UNIT) PO CAPS: 50000.0000 [IU] | ORAL_CAPSULE | ORAL | 0 refills | Status: DC

## 2020-04-15 NOTE — Telephone Encounter (Signed)
Patient received message from pharmacy that they will only fill metformin for a 90 day supply.

## 2020-04-15 NOTE — Telephone Encounter (Signed)
Last seen by Katy Danford, FNP. 

## 2020-04-16 NOTE — Progress Notes (Signed)
Chief Complaint:   OBESITY Kimberly Mckay is here to discuss her progress with her obesity treatment plan along with follow-up of her obesity related diagnoses. Kimberly Mckay is on keeping a food journal and adhering to recommended goals of 1900 calories and 120 g protein and states she is following her eating plan approximately 75% of the time. Kimberly Mckay states she is doing 0 minutes 0 times per week.  Today's visit was #: 3 Starting weight: 254 lbs Starting date: 03/20/2020 Today's weight: 257 lbs Today's date: 04/15/2020 Total lbs lost to date: 0 Total lbs lost since last in-office visit: 0  Interim History: Kimberly Mckay has been on and off journaling the last 5 years. She was able to lose >100 lbs in 2016 with [employer] and exercise. She reports significant polyphagia the last 12 months, with it worsening the last 2 months.  Subjective:   1. Vitamin D deficiency Kimberly Mckay's Vitamin D level was 19.7, well below goal of 50, on 03/20/2020. She is currently taking prescription vitamin D 50,000 IU each week. She denies nausea, vomiting or muscle weakness.   Ref. Range 03/20/2020 12:12  Vitamin D, 25-Hydroxy Latest Ref Range: 30.0 - 100.0 ng/mL 19.7 (L)   2. Insulin resistance 03/20/2020 insulin level 16.2 with normal blood glucose and A1c. 03/20/2020 CMP resulted GFR 92. Per notes, Kimberly Mckay has had elevated A1c/pre-diabetes in the past.  Lab Results  Component Value Date   INSULIN 16.2 03/20/2020   Lab Results  Component Value Date   HGBA1C 5.5 03/20/2020    3. At risk for diarrhea Kimberly Mckay is at higher risk of diarrhea due to starting Metformin for insulin resistance.  Assessment/Plan:   1. Vitamin D deficiency Low Vitamin D level contributes to fatigue and are associated with obesity, breast, and colon cancer. She agrees to continue to take prescription Vitamin D @50 ,000 IU every week and will follow-up for routine testing of Vitamin D, at least 2-3 times per year to avoid  over-replacement.  - Vitamin D, Ergocalciferol, (DRISDOL) 1.25 MG (50000 UNIT) CAPS capsule; Take 1 capsule (50,000 Units total) by mouth every 7 (seven) days.  Dispense: 4 capsule; Refill: 0  2. Insulin resistance Kimberly Mckay will continue to work on weight loss, exercise, and decreasing simple carbohydrates to help decrease the risk of diabetes. Kimberly Mckay agreed to follow-up with Kimberly Mckay as directed to closely monitor her progress. Start Metformin 500 mg, as per below.  - metFORMIN (GLUCOPHAGE) 500 MG tablet; 1/2 tab with breakfast  Dispense: 30 tablet; Refill: 0  3. At risk for diarrhea Kimberly Mckay was given approximately 15 minutes of diarrhea prevention counseling today. She is 40 y.o. female and has risk factors for diarrhea including medications and changes in diet. We discussed intensive lifestyle modifications today with an emphasis on specific weight loss instructions including dietary strategies.   Repetitive spaced learning was employed today to elicit superior memory formation and behavioral change.  4. Class 3 severe obesity with serious comorbidity and body mass index (BMI) of 40.0 to 44.9 in adult, unspecified obesity type (HCC) Kimberly Mckay is currently in the action stage of change. As such, her goal is to continue with weight loss efforts. She has agreed to keeping a food journal and adhering to recommended goals of 1900 calories and 120 g protein.   Handout: Metformin Information  Exercise goals: No exercise has been prescribed at this time.  Behavioral modification strategies: increasing lean protein intake, decreasing simple carbohydrates, no skipping meals, meal planning and cooking strategies and planning for success.  Kimberly Mckay has agreed to follow-up with our clinic in 2 weeks. She was informed of the importance of frequent follow-up visits to maximize her success with intensive lifestyle modifications for her multiple health conditions.   Objective:   Blood pressure (!)  145/77, pulse 61, temperature 98 F (36.7 C), height (P) 5\' 7"  (1.702 m), weight (P) 257 lb (116.6 kg), SpO2 100 %. Body mass index is 40.25 kg/m (pended).  General: Cooperative, alert, well developed, in no acute distress. HEENT: Conjunctivae and lids unremarkable. Cardiovascular: Regular rhythm.  Lungs: Normal work of breathing. Neurologic: No focal deficits.   Lab Results  Component Value Date   CREATININE 0.81 03/20/2020   BUN 11 03/20/2020   NA 140 03/20/2020   K 4.1 03/20/2020   CL 104 03/20/2020   CO2 24 03/20/2020   Lab Results  Component Value Date   ALT 8 03/20/2020   AST 18 03/20/2020   ALKPHOS 101 03/20/2020   BILITOT <0.2 03/20/2020   Lab Results  Component Value Date   HGBA1C 5.5 03/20/2020   Lab Results  Component Value Date   INSULIN 16.2 03/20/2020   Lab Results  Component Value Date   TSH 1.770 03/20/2020   Lab Results  Component Value Date   CHOL 153 03/20/2020   HDL 55 03/20/2020   LDLCALC 84 03/20/2020   TRIG 72 03/20/2020   CHOLHDL 2.8 03/20/2020   Lab Results  Component Value Date   WBC 4.7 03/20/2020   HGB 13.9 03/20/2020   HCT 41.7 03/20/2020   MCV 85 03/20/2020   PLT 201 03/20/2020     Attestation Statements:   Reviewed by clinician on day of visit: allergies, medications, problem list, medical history, surgical history, family history, social history, and previous encounter notes.  03/22/2020, am acting as Edmund Hilda for Energy manager, NP.  I have reviewed the above documentation for accuracy and completeness, and I agree with the above. -  Harold Moncus d. Chasmine Lender, NP-C

## 2020-04-18 ENCOUNTER — Other Ambulatory Visit: Payer: Self-pay

## 2020-04-18 ENCOUNTER — Encounter: Payer: Self-pay | Admitting: Plastic Surgery

## 2020-04-18 ENCOUNTER — Ambulatory Visit (INDEPENDENT_AMBULATORY_CARE_PROVIDER_SITE_OTHER): Payer: BC Managed Care – PPO | Admitting: Plastic Surgery

## 2020-04-18 DIAGNOSIS — N62 Hypertrophy of breast: Secondary | ICD-10-CM | POA: Diagnosis not present

## 2020-04-18 DIAGNOSIS — M549 Dorsalgia, unspecified: Secondary | ICD-10-CM | POA: Insufficient documentation

## 2020-04-18 DIAGNOSIS — G8929 Other chronic pain: Secondary | ICD-10-CM

## 2020-04-18 DIAGNOSIS — M546 Pain in thoracic spine: Secondary | ICD-10-CM | POA: Diagnosis not present

## 2020-04-18 DIAGNOSIS — M542 Cervicalgia: Secondary | ICD-10-CM | POA: Diagnosis not present

## 2020-04-18 NOTE — Progress Notes (Addendum)
Patient ID: Kimberly Mckay, female    DOB: 09/20/1980, 40 y.o.   MRN: [id number]   Chief Complaint  Patient presents with  . Advice Only    Mammary Hyperplasia: The patient is a 40 y.o. female with a history of mammary hyperplasia for several years.  She has extremely large breasts causing symptoms that include the following: Back pain in the upper and lower back, including neck pain. She pulls or pins her bra straps to provide better lift and relief of the pressure and pain. She notices relief by holding her breast up manually.  Her shoulder straps cause grooves and pain and pressure that requires padding for relief. Pain medication is sometimes required with motrin and tylenol.  Activities that are hindered by enlarged breasts include: exercise and running.  She has tried supportive clothing as well as fitted bras without improvement.  Her breasts are extremely large and fairly symmetric.  She has hyperpigmentation of the inframammary area on both sides.  The sternal to nipple distance on the right is 34.5 cm and the left is 35 cm.  The IMF distance is 17 cm.  She is 5 feet 7 inches tall and weighs 252 pounds.  Preoperative bra size =  42DDD cup.  The estimated excess breast tissue to be removed at the time of surgery from the breasts = 850 grams on the left and 850 grams on the right.  An additional 200-300 grams from the axilla on each side.  Mammogram history: None.  Family history of breast cancer:  She does not have a family history of breast cancer.  Tobacco use: She quit smoking 2 years ago and does not have diabetes.  She has 2 kids.  [facility]  She was referred to PT but has not started it yet.  She was referred by Dr. Shon Baton.  She has significant redness and rashes underneath very folds.  Unfortunately this is not going to get better with any amount of weight loss.   Review of Systems  Constitutional: Positive for activity change.  Negative for appetite change.  HENT: Negative.   Eyes: Negative.   Respiratory: Negative.  Negative for chest tightness and shortness of breath.   Cardiovascular: Negative.  Negative for leg swelling.  Gastrointestinal: Negative for abdominal distention and abdominal pain.  Endocrine: Negative.   Genitourinary: Negative.   Musculoskeletal: Positive for back pain and neck pain.  Skin: Positive for color change, rash and wound.  Neurological: Negative.   Hematological: Negative.   Psychiatric/Behavioral: Negative.     Past Medical History:  Diagnosis Date  . Anemia   . Anxiety   . Asthma   . Back pain   . Constipation   . COVID-19 04/2019  . Depression   . DJD (degenerative joint disease)   . Eczema   . Fibromyalgia   . Prediabetes   . SOBOE (shortness of breath on exertion)   . Swelling of both lower extremities     History reviewed. No pertinent surgical history.    Current Outpatient Medications:  .  albuterol (ACCUNEB) 0.63 MG/3ML nebulizer solution, Take 1 ampule by nebulization every 6 (six) hours as needed for wheezing., Disp: , Rfl:  .  albuterol (VENTOLIN HFA) 108 (90 Base) MCG/ACT inhaler, Inhale into the lungs., Disp: , Rfl:  .  ALPRAZolam (XANAX) 0.5 MG tablet, Take 0.5 mg by mouth at bedtime as needed for anxiety., Disp: , Rfl:  .  hydrocortisone 2.5 % cream, Apply topically., Disp: , Rfl:  .  ibuprofen (ADVIL,MOTRIN) 600 MG tablet, Take 600 mg by mouth every 6 (six) hours as needed., Disp: , Rfl:  .  ipratropium (ATROVENT) 0.06 % nasal spray, Place 2 sprays into both nostrils 4 (four) times daily., Disp: 15 mL, Rfl: 1 .  metFORMIN (GLUCOPHAGE) 500 MG tablet, 1/2 tab with breakfast, Disp: 30 tablet, Rfl: 0 .  multivitamin (VIT W/EXTRA C) CHEW chewable tablet, Chew by mouth., Disp: , Rfl:  .  Vitamin D, Ergocalciferol, (DRISDOL) 1.25 MG (50000 UNIT) CAPS capsule, Take 1 capsule (50,000 Units total) by mouth every 7 (seven) days., Disp: 4 capsule, Rfl: 0    Objective:   Vitals:   04/18/20 0955  BP: 140/76  Pulse: 60  Temp: 97.8 F (36.6 C)  SpO2: 98%    Physical Exam Vitals and nursing note reviewed.  Constitutional:      Appearance: Normal appearance.  HENT:     Head: Normocephalic and atraumatic.  Cardiovascular:     Rate and Rhythm: Normal rate.     Pulses: Normal pulses.  Pulmonary:     Effort: Pulmonary effort is normal. No respiratory distress.     Breath sounds: No wheezing.  Abdominal:     General: Abdomen is flat. There is no distension.     Tenderness: There is no abdominal tenderness.  Skin:    General: Skin is warm.  Neurological:     General: No focal deficit present.     Mental Status: She is alert and oriented to person, place, and time.  Psychiatric:        Mood and Affect: Mood normal.        Behavior: Behavior normal.        Thought Content: Thought content normal.     Assessment & Plan:  Chronic bilateral thoracic back pain - Plan: MM Digital Diagnostic Bilat, Ambulatory referral to Physical Therapy  Neck pain - Plan: MM Digital Diagnostic Bilat, Ambulatory referral to Physical Therapy  Symptomatic mammary hypertrophy - Plan: MM Digital Diagnostic Bilat, Ambulatory referral to Physical Therapy  Recommend mammogram and physical therapy to help with the neck and the back pain reduction.  We talked about the change in nipple areola sensation and scarring.  That this might need to be staged because of the excess axillary tissue.  He will also require an additional incision in order to get all of that removed.  Plan for bilateral breast reduction and excision of axillary tissue once she has completed the physical therapy.  She knows to let us know when she has finished. Pictures were obtained of the patient and placed in the chart with the patient's or guardian's permission.   [name] Alonso Gapinski, DO

## 2020-04-23 ENCOUNTER — Ambulatory Visit: Payer: BC Managed Care – PPO | Admitting: Rehabilitative and Restorative Service Providers"

## 2020-04-28 ENCOUNTER — Encounter: Payer: Self-pay | Admitting: Rehabilitative and Restorative Service Providers"

## 2020-04-28 ENCOUNTER — Ambulatory Visit (INDEPENDENT_AMBULATORY_CARE_PROVIDER_SITE_OTHER): Payer: BC Managed Care – PPO | Admitting: Rehabilitative and Restorative Service Providers"

## 2020-04-28 ENCOUNTER — Other Ambulatory Visit: Payer: Self-pay

## 2020-04-28 DIAGNOSIS — R293 Abnormal posture: Secondary | ICD-10-CM | POA: Diagnosis not present

## 2020-04-28 DIAGNOSIS — M6281 Muscle weakness (generalized): Secondary | ICD-10-CM

## 2020-04-28 DIAGNOSIS — M546 Pain in thoracic spine: Secondary | ICD-10-CM | POA: Diagnosis not present

## 2020-04-28 NOTE — Patient Instructions (Signed)
TENS UNIT: This is helpful for muscle pain and spasm.   Search and Purchase a TENS 7000 2nd edition at www.tenspros.com. It should be less than $30.     TENS unit instructions: Do not shower or bathe with the unit on Turn the unit off before removing electrodes or batteries If the electrodes lose stickiness add a drop of water to the electrodes after they are disconnected from the unit and place on plastic sheet. If you continued to have difficulty, call the TENS unit company to purchase more electrodes. Do not apply lotion on the skin area prior to use. Make sure the skin is clean and dry as this will help prolong the life of the electrodes. After use, always check skin for unusual red areas, rash or other skin difficulties. If there are any skin problems, does not apply electrodes to the same area. Never remove the electrodes from the unit by pulling the wires. Do not use the TENS unit or electrodes other than as directed. Do not change electrode placement without consultating your therapist or physician. Keep 2 fingers with between each electrode.   Access Code: [email or web address]: [email or web address]: 03/07/2022Prepared by: Ehren Berisha HoltExercises  Doorway Pec Stretch at 60 Degrees Abduction - 3 x daily - 7 x weekly - 3 reps - 1 sets  Doorway Pec Stretch at 90 Degrees Abduction - 3 x daily - 7 x weekly - 3 reps - 1 sets - 30 seconds hold  Doorway Pec Stretch at 120 Degrees Abduction - 3 x daily - 7 x weekly - 3 reps - 1 sets - 30 second hold hold  Seated Scapular Retraction - 2 x daily - 7 x weekly - 1-2 sets - 10 reps - 10 sec hold  Standing Scapular Retraction - 2 x daily - 7 x weekly - 1 sets - 10 reps - 10 sec hold  Shoulder External Rotation and Scapular Retraction - 2 x daily - 7 x weekly - 1 sets - 10 reps - 5 sec hold  Shoulder External Rotation and Scapular Retraction with Resistance - 2 x daily - 7 x weekly - 10 reps - 3 sets

## 2020-04-28 NOTE — Therapy (Signed)
[facility] [place] [address] [place], [place], [address] Phone: [phone]   Fax:  [phone]  Physical Therapy Evaluation  Patient Details  Name: Kimberly Mckay MRN: [id number] Date of Birth: August 01, 1980 Referring Provider (PT): Dr Marga Hoots   Encounter Date: 04/28/2020   PT End of Session - 04/28/20 0803    Visit Number 1    Number of Visits 6    Date for PT Re-Evaluation 06/09/20    Authorization Type BC[employer]    PT Start Time 0713    PT Stop Time 0755    PT Time Calculation (min) 42 min    Activity Tolerance Patient tolerated treatment well           Past Medical History:  Diagnosis Date  . Anemia   . Anxiety   . Asthma   . Back pain   . Constipation   . COVID-19 04/2019  . Depression   . DJD (degenerative joint disease)   . Eczema   . Fibromyalgia   . Prediabetes   . SOBOE (shortness of breath on exertion)   . Swelling of both lower extremities     History reviewed. No pertinent surgical history.  There were no vitals filed for this visit.    Subjective Assessment - 04/28/20 0723    Subjective Patient reports that she has DJD in lumbar spine with nerve pain. She has had pain in the upper back for the past 10 years.    Pertinent History Arthritis; COPD; anxiety; depression; chronic LBP; mid back pain    Patient Stated Goals help back pain    Currently in Pain? Yes    Pain Score 0-No pain    Pain Location Thoracic    Pain Orientation Mid;Lower    Pain Descriptors / Indicators Burning    Pain Type Chronic pain    Pain Radiating Towards down Lt LE lateral thigh to knee    Pain Onset More than a month ago    Pain Frequency Constant    Aggravating Factors  lifting; prolonged sitting    Pain Relieving Factors OTC antiinflammatory              [facility] PT Assessment - 04/28/20 0001      Assessment   Medical Diagnosis Thoracic pain; mammary hypertrophy; LBP    Referring Provider (PT) Dr  Sherron Monday Dillingham    Onset Date/Surgical Date 02/22/10   increased in the past 6 months   Hand Dominance Right    Next MD Visit not scheduled    Prior Therapy none      Precautions   Precautions None      Restrictions   Weight Bearing Restrictions No      Balance Screen   Has the patient fallen in the past 6 months No    Has the patient had a decrease in activity level because of a fear of falling?  No    Is the patient reluctant to leave their home because of a fear of falling?  No      Home Tourist information centre manager residence      Prior Function   Level of Independence Independent    Vocation Full time employment    Vocation Requirements sitting desk computer    Leisure household chores; sedentary      Observation/Other Assessments   Focus on Therapeutic Outcomes (FOTO)  functional limitation score 43      Sensation   Additional Comments Burning lateral  thigh      Posture/Postural Control   Posture Comments head forward; shoudlers rounded and elevated; increased thoracic kyphosis      AROM   Cervical Flexion 51    Cervical Extension 43    Cervical - Right Side Bend 23    Cervical - Left Side Bend 19 pain    Cervical - Right Rotation 61    Cervical - Left Rotation 63    Lumbar Flexion 20% painful - hands resting on thigh walking up thighs to return to stand    Lumbar Extension 50%    Lumbar - Right Side Bend 50% discomfort    Lumbar - Left Side Bend 35% pain Rt lumbar    Lumbar - Right Rotation 15% painful    Lumbar - Left Rotation 10% painful Rt lumbar      Strength   Overall Strength Comments Bilat UE strength WFL's except middle/lower trap 4/5      Palpation   Spinal mobility hypomobility and pain with PA mobs lumbar; thoracic; cervical spine    Palpation comment muscular tightness and pain with palpation through the pecs; upper trap; leveator; scapular paraspinals; lumbar paraspinals into bilat posterior hips                       Objective measurements completed on examination: See above findings.       [facility] Adult PT Treatment/Exercise - 04/28/20 0001      Self-Care   Self-Care Other Self-Care Comments    Other Self-Care Comments  postural correction/education      Therapeutic Activites    Therapeutic Activities Other Therapeutic Activities    Other Therapeutic Activities myofacial ball release work standing      Shoulder Exercises: Standing   Retraction Strengthening;Both;10 reps;Theraband   noodle along spine   Theraband Level (Shoulder Retraction) Level 2 (Red)    Other Standing Exercises scap squeeze 10 sec x 10; L's x 10 swim noodle along spine                  PT Education - 04/28/20 0753    Education Details HEP TENS POC    Person(s) Educated Patient    Methods Explanation;Demonstration;Tactile cues;Verbal cues;Handout    Comprehension Verbalized understanding;Returned demonstration;Verbal cues required;Tactile cues required               PT Long Term Goals - 04/28/20 0810      PT LONG TERM GOAL #1   Title Improve posture and alignment with patient to demonstrate improved upright posture with posterior shoulder girdle musculature engaged    Time 6    Period Weeks    Status New    Target Date 06/09/20      PT LONG TERM GOAL #2   Title Increase postural strength to 4+/5 to 5/5 for middle and lower traps    Time 6    Period Weeks    Status New    Target Date 06/09/20      PT LONG TERM GOAL #3   Title Independent in HEP    Time 6    Period Weeks    Status New    Target Date 06/09/20      PT LONG TERM GOAL #4   Title Improve functional limitation score to 63    Time 6    Period Weeks    Status New    Target Date 06/09/20  Plan - 04/28/20 0804    Clinical Impression Statement Patient presents with chronic bilat thoracic pain; cervical and lumbar pain of at least 10 years. She reports increased symptoms in the past  6 months. Patient has poor posture and alignment; limited spinal mobility; postural weakness; decreased activity tolerance; sedentary lifestyle. Patient will benefit from PT to address problems identified.    Personal Factors and Comorbidities Fitness;Behavior Pattern;Comorbidity 1;Time since onset of injury/illness/exacerbation    Comorbidities obesity    Examination-Activity Limitations Bend;Carry;Lift;Reach Overhead    Stability/Clinical Decision Making Stable/Uncomplicated    Clinical Decision Making Low    Rehab Potential Good    PT Frequency 2x / week    PT Duration 6 weeks    PT Treatment/Interventions ADLs/Self Care Home Management;Aquatic Therapy;Cryotherapy;Electrical Stimulation;Iontophoresis 4mg /ml Dexamethasone;Moist Heat;Ultrasound;Therapeutic activities;Therapeutic exercise;Neuromuscular re-education;Patient/family education;Manual techniques;Dry needling;Taping    PT Next Visit Plan review HEP (add axial extension; TB exercises; core stabilization; progress postural strengthening); modalities as indicated    PT Home Exercise Plan [email or web address]    Consulted and Agree with Plan of Care Patient           Patient will benefit from skilled therapeutic intervention in order to improve the following deficits and impairments:  Decreased range of motion,Impaired UE functional use,Obesity,Decreased activity tolerance,Pain,Hypomobility,Impaired flexibility,Improper body mechanics,Decreased strength,Decreased mobility,Postural dysfunction,Impaired sensation  Visit Diagnosis: Pain in thoracic spine  Abnormal posture  Muscle weakness (generalized)     Problem List Patient Active Problem List   Diagnosis Date Noted  . Back pain 04/18/2020  . Neck pain 04/18/2020  . Symptomatic mammary hypertrophy 04/18/2020  . Insulin resistance 04/15/2020  . Class 3 severe obesity with serious comorbidity and body mass index (BMI) of 40.0 to 44.9 in adult [facility]) 04/15/2020  . Vitamin D deficiency  04/02/2020  . At risk for diabetes mellitus 04/02/2020  . Absolute anemia 04/02/2020  . Other fatigue 03/20/2020  . SOBOE (shortness of breath on exertion) 03/20/2020  . Prediabetes 03/20/2020  . Mood disorder (HCC)- emotional eating 03/20/2020  . Anemia 03/20/2020  . Other constipation 03/20/2020  . At risk for impaired metabolic function 03/20/2020    Celyn 03/22/2020 PT, MPH  04/28/2020, 8:14 AM  [facility] [address] [place], [place], [place] Phone: [phone]   Fax:  [phone]  Name: Kimberly Mckay MRN: [name] Date of Birth: [birth year]

## 2020-04-29 ENCOUNTER — Ambulatory Visit (INDEPENDENT_AMBULATORY_CARE_PROVIDER_SITE_OTHER): Payer: BC Managed Care – PPO | Admitting: Family Medicine

## 2020-04-29 ENCOUNTER — Encounter (INDEPENDENT_AMBULATORY_CARE_PROVIDER_SITE_OTHER): Payer: Self-pay | Admitting: Family Medicine

## 2020-04-29 ENCOUNTER — Other Ambulatory Visit: Payer: Self-pay

## 2020-04-29 VITALS — BP 120/68 | HR 56 | Temp 98.2°F | Ht 67.0 in | Wt 258.0 lb

## 2020-04-29 DIAGNOSIS — E8881 Metabolic syndrome: Secondary | ICD-10-CM

## 2020-04-29 DIAGNOSIS — Z6841 Body Mass Index (BMI) 40.0 and over, adult: Secondary | ICD-10-CM | POA: Diagnosis not present

## 2020-04-29 DIAGNOSIS — Z9189 Other specified personal risk factors, not elsewhere classified: Secondary | ICD-10-CM | POA: Diagnosis not present

## 2020-04-29 MED ORDER — METFORMIN HCL 500 MG PO TABS: ORAL_TABLET | ORAL | 0 refills | Status: DC

## 2020-04-29 MED ORDER — METFORMIN HCL 500 MG PO TABS: 500.0000 mg | ORAL_TABLET | Freq: Two times a day (BID) | ORAL | 0 refills | Status: DC

## 2020-04-29 NOTE — Telephone Encounter (Signed)
Please advise 

## 2020-04-30 ENCOUNTER — Other Ambulatory Visit: Payer: Self-pay | Admitting: Plastic Surgery

## 2020-04-30 ENCOUNTER — Encounter: Payer: BC Managed Care – PPO | Admitting: Rehabilitative and Restorative Service Providers"

## 2020-04-30 DIAGNOSIS — M542 Cervicalgia: Secondary | ICD-10-CM

## 2020-04-30 DIAGNOSIS — G8929 Other chronic pain: Secondary | ICD-10-CM

## 2020-04-30 DIAGNOSIS — N62 Hypertrophy of breast: Secondary | ICD-10-CM

## 2020-05-01 NOTE — Progress Notes (Signed)
Chief Complaint:   OBESITY Kimberly Mckay is here to discuss her progress with her obesity treatment plan along with follow-up of her obesity related diagnoses.   Today's visit was #: 4 Starting weight: 254 lbs Starting date: 03/20/2020 Today's weight: 258 lbs Today's date: 04/29/2020 Total lbs lost to date: +4 Body mass index is 40.41 kg/m.   Interim History:  [name] was seen by William Hamburger, NP, at her last office visit.  She stated metformin.  No side effects or GI issues.  We also changed her to journaling 2 office visits ago per her request.  Today, she states that she "hates journaling" and wants to change back to Category 3.  Current Meal Plan: keeping a food journal and adhering to recommended goals of 1900 calories and 120 grams of protein for 80% of the time.  Current Exercise Plan: Walking for 45 minutes 3-4 times per week.  Assessment/Plan:    Medications Discontinued During This Encounter  Medication Reason  . metFORMIN (GLUCOPHAGE) 500 MG tablet Reorder  . metFORMIN (GLUCOPHAGE) 500 MG tablet   . albuterol (ACCUNEB) 0.63 MG/3ML nebulizer solution   . hydrocortisone 2.5 % cream   . ipratropium (ATROVENT) 0.06 % nasal spray      Meds ordered this encounter  Medications  . DISCONTD: metFORMIN (GLUCOPHAGE) 500 MG tablet    Sig: 1/2 tab with breakfast    Dispense:  30 tablet    Refill:  0  . DISCONTD: metFORMIN (GLUCOPHAGE) 500 MG tablet    Sig: Take 1 tablet (500 mg total) by mouth 2 (two) times daily with a meal.    Dispense:  60 tablet    Refill:  0      1. Insulin resistance Not at goal. Goal is HgbA1c < 5.7, fasting insulin closer to 5.  Medication: metformin 250 mg daily.    Plan:  - Extensive counseling done on this disease.  - She will continue to focus on protein-rich, low simple carbohydrate foods. We reviewed the importance of hydration, regular exercise for stress reduction, and restorative sleep.  Increase metformin to 500 mg twice daily, as  per below.    -Handout on insulin resistance and metformin given today after a long discussion about the medication's risks and benefits.   Lab Results  Component Value Date   HGBA1C 5.5 03/20/2020   Lab Results  Component Value Date   INSULIN 16.2 03/20/2020   - Increase and refill metFORMIN (GLUCOPHAGE) 500 MG tablet; Take 1 tablet (500 mg total) by mouth 2 (two) times daily with a meal.  Dispense: 60 tablet; Refill: 0    2. At risk for diabetes mellitus - [name] was given diabetes prevention education and counseling today of more than 9 minutes.  - Counseled patient on pathophysiology of disease and meaning/ implication of lab results.  - Reviewed how certain foods can either stimulate or inhibit insulin release, and subsequently affect hunger pathways  - Importance of following a healthy meal plan with limiting amounts of simple carbohydrates discussed with patient - Effects of regular aerobic exercise on blood sugar regulation reviewed and encouraged an eventual goal of 30 min 5d/week or more as a minimum.  - Briefly discussed treatment options, which always include dietary and lifestyle modification as first line.   - Handouts provided at patient's desire and/or told to go online to the American Diabetes Association website for further information.    3. Class 3 severe obesity with serious comorbidity and body mass index (BMI)  of 40.0 to 44.9 in adult, unspecified obesity type [facility])  Course: [name] is currently in the action stage of change. As such, her goal is to continue with weight loss efforts.   Nutrition goals: She has agreed to the Category 3 Plan.   Exercise goals: As is.  Behavioral modification strategies: increasing lean protein intake, decreasing simple carbohydrates, better snacking choices and avoiding temptations.    Ladeana has agreed to follow-up with our clinic in 2-3 weeks. She was informed of the importance of frequent follow-up visits to  maximize her success with intensive lifestyle modifications for her multiple health conditions.     Objective:   Blood pressure 120/68, pulse (!) 56, temperature 98.2 F (36.8 C), height 5\' 7"  (1.702 m), weight 258 lb (117 kg), SpO2 97 %. Body mass index is 40.41 kg/m.  General: Cooperative, alert, well developed, in no acute distress. HEENT: Conjunctivae and lids unremarkable. Cardiovascular: Regular rhythm.  Lungs: Normal work of breathing. Neurologic: No focal deficits.   Lab Results  Component Value Date   CREATININE 0.81 03/20/2020   BUN 11 03/20/2020   NA 140 03/20/2020   K 4.1 03/20/2020   CL 104 03/20/2020   CO2 24 03/20/2020   Lab Results  Component Value Date   ALT 8 03/20/2020   AST 18 03/20/2020   ALKPHOS 101 03/20/2020   BILITOT <0.2 03/20/2020   Lab Results  Component Value Date   HGBA1C 5.5 03/20/2020   Lab Results  Component Value Date   INSULIN 16.2 03/20/2020   Lab Results  Component Value Date   TSH 1.770 03/20/2020   Lab Results  Component Value Date   CHOL 153 03/20/2020   HDL 55 03/20/2020   LDLCALC 84 03/20/2020   TRIG 72 03/20/2020   CHOLHDL 2.8 03/20/2020   Lab Results  Component Value Date   WBC 4.7 03/20/2020   HGB 13.9 03/20/2020   HCT 41.7 03/20/2020   MCV 85 03/20/2020   PLT 201 03/20/2020     Attestation Statements:   Reviewed by clinician on day of visit: allergies, medications, problem list, medical history, surgical history, family history, social history, and previous encounter notes.  I, 03/22/2020, CMA, am acting as Insurance claims handler for Energy manager, DO.  I have reviewed the above documentation for accuracy and completeness, and I agree with the above. [employer], D.O.  The 21st Century Cures Act was signed into law in 2016 which includes the topic of electronic health records.  This provides immediate access to information in MyChart.  This includes consultation notes, operative notes, office  notes, lab results and pathology reports.  If you have any questions about what you read please let 2017 know at your next visit so we can discuss your concerns and take corrective action if need be.  We are right here with you.

## 2020-05-05 ENCOUNTER — Telehealth: Payer: Self-pay | Admitting: Rehabilitative and Restorative Service Providers"

## 2020-05-05 ENCOUNTER — Encounter: Payer: BC Managed Care – PPO | Admitting: Rehabilitative and Restorative Service Providers"

## 2020-05-05 NOTE — Telephone Encounter (Signed)
Chrissy failed to show for scheduled appointment. Left VM asking that she call to confirm additional scheduled appointments.   Celyn P. Leonor Liv PT, MPH 05/05/20 7:47 AM

## 2020-05-08 ENCOUNTER — Ambulatory Visit (INDEPENDENT_AMBULATORY_CARE_PROVIDER_SITE_OTHER): Payer: BC Managed Care – PPO | Admitting: Rehabilitative and Restorative Service Providers"

## 2020-05-08 ENCOUNTER — Encounter: Payer: Self-pay | Admitting: Rehabilitative and Restorative Service Providers"

## 2020-05-08 ENCOUNTER — Other Ambulatory Visit: Payer: Self-pay

## 2020-05-08 DIAGNOSIS — M6281 Muscle weakness (generalized): Secondary | ICD-10-CM

## 2020-05-08 DIAGNOSIS — M546 Pain in thoracic spine: Secondary | ICD-10-CM

## 2020-05-08 DIAGNOSIS — R293 Abnormal posture: Secondary | ICD-10-CM

## 2020-05-08 NOTE — Patient Instructions (Signed)
Access Code: [email or web address]: [email or web address]: 03/17/2022Prepared by: Lorae Roig HoltExercises  Doorway Pec Stretch at 60 Degrees Abduction - 3 x daily - 7 x weekly - 3 reps - 1 sets  Doorway Pec Stretch at 90 Degrees Abduction - 3 x daily - 7 x weekly - 3 reps - 1 sets - 30 seconds hold  Doorway Pec Stretch at 120 Degrees Abduction - 3 x daily - 7 x weekly - 3 reps - 1 sets - 30 second hold hold  Seated Scapular Retraction - 2 x daily - 7 x weekly - 1-2 sets - 10 reps - 10 sec hold  Standing Scapular Retraction - 2 x daily - 7 x weekly - 1 sets - 10 reps - 10 sec hold  Shoulder External Rotation and Scapular Retraction - 2 x daily - 7 x weekly - 1 sets - 10 reps - 5 sec hold  Shoulder External Rotation and Scapular Retraction with Resistance - 2 x daily - 7 x weekly - 10 reps - 3 sets  Seated Cervical Retraction - 2 x daily - 7 x weekly - 1-2 sets - 5-10 reps - 10 sec hold  Standing Bilateral Low Shoulder Row with Anchored Resistance - 2 x daily - 7 x weekly - 1-3 sets - 10 reps - 2-3 sec hold  Shoulder Extension with Resistance - 2 x daily - 7 x weekly - 1-3 sets - 10 reps - 2-3 sec hold  Anti-Rotation Lateral Stepping with Press - 2 x daily - 7 x weekly - 1-2 sets - 10 reps - 2-3 sec hold  Sidelying Open Book Thoracic Lumbar Rotation and Extension - 2 x daily - 7 x weekly - 1 sets - 5-10 reps - 10 sec hold

## 2020-05-08 NOTE — Therapy (Signed)
[facility] [place] [address] [place], [place], [address] Phone: [phone]   Fax:  [phone]  Physical Therapy Treatment  Patient Details  Name: Ariba Lehnen MRN: [id number] Date of Birth: 1980/04/02 Referring Provider (PT): Dr Marga Hoots   Encounter Date: 05/08/2020   PT End of Session - 05/08/20 1701    Visit Number 2    Number of Visits 6    Date for PT Re-Evaluation 06/09/20    Authorization Type BCBS    PT Start Time 1700    PT Stop Time 1733    PT Time Calculation (min) 33 min    Activity Tolerance Patient tolerated treatment well           Past Medical History:  Diagnosis Date  . Anemia   . Anxiety   . Asthma   . Back pain   . Constipation   . COVID-19 04/2019  . Depression   . DJD (degenerative joint disease)   . Eczema   . Fibromyalgia   . Prediabetes   . SOBOE (shortness of breath on exertion)   . Swelling of both lower extremities     History reviewed. No pertinent surgical history.  There were no vitals filed for this visit.   Subjective Assessment - 05/08/20 1702    Subjective Patient reports that she is feeling OK. She has ben stretching. Pain is unchanged. Always worse at the end of the work day - from sitting and work at the computer.    Currently in Pain? Yes    Pain Score 5     Pain Location Thoracic    Pain Orientation Mid;Lower    Pain Descriptors / Indicators Burning    Pain Type Chronic pain                             [facility] Adult PT Treatment/Exercise - 05/08/20 0001      Shoulder Exercises: Standing   Extension Strengthening;Both;10 reps;Theraband    Theraband Level (Shoulder Extension) Level 4 (Blue)    Row Strengthening;Both;10 reps;Theraband    Theraband Level (Shoulder Row) Level 4 (Blue)    Row Limitations bow and arrow blue TB x 10 reps each side    Retraction Strengthening;Both;10 reps;Theraband   noodle along spine   Theraband Level  (Shoulder Retraction) Level 2 (Red)    Other Standing Exercises chin tuck 10 sec x 5; scap squeeze 10 sec x 10; L's x 10; W's x 10  swim noodle along spine    Other Standing Exercises antirotation blue TB 10 reps each side      Shoulder Exercises: ROM/Strengthening   UBE (Upper Arm Bike) L3 x 3 min - 1.5 fwd/1.5 back      Shoulder Exercises: Stretch   Other Shoulder Stretches open book thoracic rotation 5-10 sec hold x 5 each side    Other Shoulder Stretches doorway 3 positions x 30 sec hold x 2 reps                  PT Education - 05/08/20 1726    Education Details HEP    Person(s) Educated Patient    Methods Explanation;Demonstration;Tactile cues;Verbal cues;Handout    Comprehension Verbalized understanding;Returned demonstration;Verbal cues required;Tactile cues required               PT Long Term Goals - 04/28/20 0810      PT LONG TERM GOAL #1   Title Improve  posture and alignment with patient to demonstrate improved upright posture with posterior shoulder girdle musculature engaged    Time 6    Period Weeks    Status New    Target Date 06/09/20      PT LONG TERM GOAL #2   Title Increase postural strength to 4+/5 to 5/5 for middle and lower traps    Time 6    Period Weeks    Status New    Target Date 06/09/20      PT LONG TERM GOAL #3   Title Independent in HEP    Time 6    Period Weeks    Status New    Target Date 06/09/20      PT LONG TERM GOAL #4   Title Improve functional limitation score to 63    Time 6    Period Weeks    Status New    Target Date 06/09/20                 Plan - 05/08/20 1711    Clinical Impression Statement Patient reports that she has been working on exercises at home. She is anxious to complete her therapy sessions to proceed with surgery. Added strengthening exercise today without difficulty. Issued TB for home.    Rehab Potential Good    PT Frequency 2x / week    PT Duration 6 weeks    PT  Treatment/Interventions ADLs/Self Care Home Management;Aquatic Therapy;Cryotherapy;Electrical Stimulation;Iontophoresis 4mg /ml Dexamethasone;Moist Heat;Ultrasound;Therapeutic activities;Therapeutic exercise;Neuromuscular re-education;Patient/family education;Manual techniques;Dry needling;Taping    PT Next Visit Plan review HEP; TB exercises; core stabilization; progress postural strengthening); modalities as indicated thoracic extension over chair; lat pull; forward row; prone exercises as tolerated    PT Home Exercise Plan [email or web address]    Consulted and Agree with Plan of Care Patient           Patient will benefit from skilled therapeutic intervention in order to improve the following deficits and impairments:     Visit Diagnosis: Pain in thoracic spine  Abnormal posture  Muscle weakness (generalized)     Problem List Patient Active Problem List   Diagnosis Date Noted  . Back pain 04/18/2020  . Neck pain 04/18/2020  . Symptomatic mammary hypertrophy 04/18/2020  . Insulin resistance 04/15/2020  . Class 3 severe obesity with serious comorbidity and body mass index (BMI) of 40.0 to 44.9 in adult [facility]) 04/15/2020  . Vitamin D deficiency 04/02/2020  . At risk for diabetes mellitus 04/02/2020  . Absolute anemia 04/02/2020  . Other fatigue 03/20/2020  . SOBOE (shortness of breath on exertion) 03/20/2020  . Prediabetes 03/20/2020  . Mood disorder (HCC)- emotional eating 03/20/2020  . Anemia 03/20/2020  . Other constipation 03/20/2020  . At risk for impaired metabolic function 03/20/2020    Lenor Provencher 03/22/2020 PT, MPH  05/08/2020, 5:47 PM  [facility] [address] [place] [address] [place], [place], [place] Phone: [phone]   Fax:  [phone]  Name: Ceana Fiala MRN: [name] Date of Birth: [birth year]

## 2020-05-12 ENCOUNTER — Ambulatory Visit (INDEPENDENT_AMBULATORY_CARE_PROVIDER_SITE_OTHER): Payer: BC Managed Care – PPO | Admitting: Rehabilitative and Restorative Service Providers"

## 2020-05-12 ENCOUNTER — Other Ambulatory Visit: Payer: Self-pay

## 2020-05-12 ENCOUNTER — Encounter: Payer: Self-pay | Admitting: Rehabilitative and Restorative Service Providers"

## 2020-05-12 DIAGNOSIS — M546 Pain in thoracic spine: Secondary | ICD-10-CM | POA: Diagnosis not present

## 2020-05-12 DIAGNOSIS — R293 Abnormal posture: Secondary | ICD-10-CM | POA: Diagnosis not present

## 2020-05-12 DIAGNOSIS — M6281 Muscle weakness (generalized): Secondary | ICD-10-CM | POA: Diagnosis not present

## 2020-05-12 NOTE — Patient Instructions (Signed)

## 2020-05-12 NOTE — Therapy (Signed)
[facility] [place] [address] [place] [address] [place], [place], [address] Phone: [phone]   Fax:  [phone]  Physical Therapy Treatment  Patient Details  Name: Kimberly Mckay MRN: [id number] Date of Birth: Feb 19, 1981 Referring Provider (PT): Dr Marga Hoots   Encounter Date: 05/12/2020   PT End of Session - 05/12/20 0746    Visit Number 3    Number of Visits 6    Date for PT Re-Evaluation 06/09/20    Authorization Type BCBS    PT Start Time 0715    PT Stop Time 0748    PT Time Calculation (min) 33 min    Activity Tolerance Patient tolerated treatment well           Past Medical History:  Diagnosis Date  . Anemia   . Anxiety   . Asthma   . Back pain   . Constipation   . COVID-19 04/2019  . Depression   . DJD (degenerative joint disease)   . Eczema   . Fibromyalgia   . Prediabetes   . SOBOE (shortness of breath on exertion)   . Swelling of both lower extremities     History reviewed. No pertinent surgical history.  There were no vitals filed for this visit.   Subjective Assessment - 05/12/20 0720    Subjective LB is hurting today. "It does that. That's where that disc is slipped" "Cleaned house all weekend"    Currently in Pain? Yes    Pain Score 6     Pain Location Thoracic    Pain Orientation Mid;Lower    Pain Descriptors / Indicators Burning;Aching    Pain Type Chronic pain                             [facility] Adult PT Treatment/Exercise - 05/12/20 0001      Exercises   Exercises --   trial of abdominal tightening and marching supine not tolerated due to LBP     Shoulder Exercises: Supine   Flexion Strengthening;Both;10 reps;Theraband   maintaining tension with TB with flexion overhead   Theraband Level (Shoulder Flexion) Level 3 (Green)    Shoulder Flexion Weight (lbs) 2 sets    Flexion Limitations alternate shoulder flexion x 10 each UE    ABduction Strengthening;Both;10  reps;Theraband    Theraband Level (Shoulder ABduction) Level 3 (Green)    ABduction Limitations 2 sets    Diagonals Strengthening;Right;Left;10 reps;Theraband    Theraband Level (Shoulder Diagonals) Level 3 (Green)    Diagonals Limitations 2 sets    Other Supine Exercises shoulder press 3# weigths x 10 each LE    Other Supine Exercises shoulder stabilization 3# wt 90 deg shd flexion circles CW/CCW x 10 each; rest small range flex/ext; horizontal ab/ad x 20 each      Shoulder Exercises: ROM/Strengthening   UBE (Upper Arm Bike) L3 x 4 min - 2 fwd/2 back      Shoulder Exercises: Stretch   Other Shoulder Stretches doorway 3 positions x 30 sec hold x 2 reps      Moist Heat Therapy   Number Minutes Moist Heat 10 Minutes    Moist Heat Location Lumbar Spine                  PT Education - 05/12/20 0742    Education Details HEP    Person(s) Educated Patient    Methods Explanation;Handout    Comprehension Verbalized understanding  PT Long Term Goals - 04/28/20 0810      PT LONG TERM GOAL #1   Title Improve posture and alignment with patient to demonstrate improved upright posture with posterior shoulder girdle musculature engaged    Time 6    Period Weeks    Status New    Target Date 06/09/20      PT LONG TERM GOAL #2   Title Increase postural strength to 4+/5 to 5/5 for middle and lower traps    Time 6    Period Weeks    Status New    Target Date 06/09/20      PT LONG TERM GOAL #3   Title Independent in HEP    Time 6    Period Weeks    Status New    Target Date 06/09/20      PT LONG TERM GOAL #4   Title Improve functional limitation score to 63    Time 6    Period Weeks    Status New    Target Date 06/09/20                 Plan - 05/12/20 0759    Clinical Impression Statement Patient reports that she has not worked on her exercises at home over the weekend. She has been cleaning in her bedroom and cleaning the house. She reports  significant increase in LBP today which she states is typical of her back problems. Trial of moist heat for LB with upper body strengtheing in supine. Offered suggestions for sitting at work. Continued upper body exercise in supine with TB and weights.    Rehab Potential Good    PT Frequency 2x / week    PT Duration 6 weeks    PT Treatment/Interventions ADLs/Self Care Home Management;Aquatic Therapy;Cryotherapy;Electrical Stimulation;Iontophoresis 4mg /ml Dexamethasone;Moist Heat;Ultrasound;Therapeutic activities;Therapeutic exercise;Neuromuscular re-education;Patient/family education;Manual techniques;Dry needling;Taping    PT Next Visit Plan review HEP; TB exercises; core stabilization; progress postural strengthening; modalities as indicated thoracic extension over chair; lat pull; forward row; prone exercises as tolerated    PT Home Exercise Plan [email or web address]    Consulted and Agree with Plan of Care Patient           Patient will benefit from skilled therapeutic intervention in order to improve the following deficits and impairments:     Visit Diagnosis: Pain in thoracic spine  Abnormal posture  Muscle weakness (generalized)     Problem List Patient Active Problem List   Diagnosis Date Noted  . Back pain 04/18/2020  . Neck pain 04/18/2020  . Symptomatic mammary hypertrophy 04/18/2020  . Insulin resistance 04/15/2020  . Class 3 severe obesity with serious comorbidity and body mass index (BMI) of 40.0 to 44.9 in adult [facility]) 04/15/2020  . Vitamin D deficiency 04/02/2020  . At risk for diabetes mellitus 04/02/2020  . Absolute anemia 04/02/2020  . Other fatigue 03/20/2020  . SOBOE (shortness of breath on exertion) 03/20/2020  . Prediabetes 03/20/2020  . Mood disorder (HCC)- emotional eating 03/20/2020  . Anemia 03/20/2020  . Other constipation 03/20/2020  . At risk for impaired metabolic function 03/20/2020    Kimberly Mckay 03/22/2020 PT, MPH  05/12/2020, 8:04 AM  [facility] [address] [place] [address] [place], [place], [place] Phone: [phone]   Fax:  [phone]  Name: Kimberly Mckay MRN: [name] Date of Birth: 1980-09-25

## 2020-05-13 ENCOUNTER — Encounter (INDEPENDENT_AMBULATORY_CARE_PROVIDER_SITE_OTHER): Payer: Self-pay | Admitting: Family Medicine

## 2020-05-13 ENCOUNTER — Encounter (INDEPENDENT_AMBULATORY_CARE_PROVIDER_SITE_OTHER): Payer: Self-pay | Admitting: Adult Health

## 2020-05-13 ENCOUNTER — Ambulatory Visit (INDEPENDENT_AMBULATORY_CARE_PROVIDER_SITE_OTHER): Payer: BC Managed Care – PPO | Admitting: Adult Health

## 2020-05-13 VITALS — BP 115/74 | HR 76 | Temp 98.0°F | Ht 67.0 in | Wt 258.0 lb

## 2020-05-13 DIAGNOSIS — Z6841 Body Mass Index (BMI) 40.0 and over, adult: Secondary | ICD-10-CM | POA: Diagnosis not present

## 2020-05-13 DIAGNOSIS — E8881 Metabolic syndrome: Secondary | ICD-10-CM | POA: Diagnosis not present

## 2020-05-13 DIAGNOSIS — Z9189 Other specified personal risk factors, not elsewhere classified: Secondary | ICD-10-CM

## 2020-05-13 DIAGNOSIS — E559 Vitamin D deficiency, unspecified: Secondary | ICD-10-CM | POA: Diagnosis not present

## 2020-05-13 MED ORDER — METFORMIN HCL 500 MG PO TABS: 500.0000 mg | ORAL_TABLET | Freq: Two times a day (BID) | ORAL | 0 refills | Status: DC

## 2020-05-13 MED ORDER — VITAMIN D (ERGOCALCIFEROL) 1.25 MG (50000 UNIT) PO CAPS
50000.0000 [IU] | ORAL_CAPSULE | ORAL | 0 refills | Status: DC
Start: 2020-05-13 — End: 2020-06-16

## 2020-05-13 NOTE — Telephone Encounter (Signed)
Please advise 

## 2020-05-14 ENCOUNTER — Ambulatory Visit (INDEPENDENT_AMBULATORY_CARE_PROVIDER_SITE_OTHER): Payer: BC Managed Care – PPO | Admitting: Rehabilitative and Restorative Service Providers"

## 2020-05-14 ENCOUNTER — Other Ambulatory Visit: Payer: Self-pay

## 2020-05-14 DIAGNOSIS — R293 Abnormal posture: Secondary | ICD-10-CM

## 2020-05-14 DIAGNOSIS — M546 Pain in thoracic spine: Secondary | ICD-10-CM | POA: Diagnosis not present

## 2020-05-14 NOTE — Progress Notes (Signed)
Chief Complaint:   OBESITY Kimberly Mckay is here to discuss her progress with her obesity treatment plan along with follow-up of her obesity related diagnoses. Kimberly Mckay is on the Category 3 Plan and states she is following her eating plan approximately 75-80% of the time. Kimberly Mckay states she is walking 20 minutes 5 times per week.  Today's visit was #: 5 Starting weight: 254 lbs Starting date: 03/20/2020 Today's weight: 258 lbs Today's date: 05/13/2020 Total lbs lost to date: 0 Total lbs lost since last in-office visit: 0  Interim History: Despite increase in Metformin (500 mg BID) and following category 3 meal plan 75-80%, Kimberly Mckay continues to experience profound polyphagia. She reports waking hungry and experiencing carb cravings throughout the day, despite consistent adherence to category 3. She states, "I'm a meat and veggie girl."  Subjective:   1. Insulin resistance Kimberly Mckay was started on Metformin 500 mg 1/2 tab on 04/15/2020. At follow up OV, she was still experiencing polyphagia, so Metformin was increased to 500 mg BID. She continues to experience profound polyphagia. We discussed the risks and benefits of converting from Metformin to GLP-1. Will remain on Biguanide for now.  Lab Results  Component Value Date   INSULIN 16.2 03/20/2020   Lab Results  Component Value Date   HGBA1C 5.5 03/20/2020    2. Vitamin D deficiency Kimberly Mckay's Vitamin D level was well below goal of 50, at 19.7 on 03/20/2020. She is currently taking prescription vitamin D 50,000 IU each week. She denies nausea, vomiting or muscle weakness.   Ref. Range 03/20/2020 12:12  Vitamin D, 25-Hydroxy Latest Ref Range: 30.0 - 100.0 ng/mL 19.7 (L)   3. At risk for diabetes mellitus Kimberly Mckay is at higher than average risk for developing diabetes due to obesity and insulin resistance.  Assessment/Plan:   1. Insulin resistance Kimberly Mckay will continue to work on weight loss, exercise, and decreasing  simple carbohydrates to help decrease the risk of diabetes. Kimberly Mckay agreed to follow-up with [place] as directed to closely monitor her progress.  - metFORMIN (GLUCOPHAGE) 500 MG tablet; Take 1 tablet (500 mg total) by mouth 2 (two) times daily with a meal.  Dispense: 60 tablet; Refill: 0  2. Vitamin D deficiency Low Vitamin D level contributes to fatigue and are associated with obesity, breast, and colon cancer. She agrees to continue to take prescription Vitamin D @50 ,000 IU every week and will follow-up for routine testing of Vitamin D, at least 2-3 times per year to avoid over-replacement.  - Vitamin D, Ergocalciferol, (DRISDOL) 1.25 MG (50000 UNIT) CAPS capsule; Take 1 capsule (50,000 Units total) by mouth every 7 (seven) days.  Dispense: 4 capsule; Refill: 0  3. At risk for diabetes mellitus Kimberly Mckay was given approximately 15 minutes of diabetes education and counseling today. We discussed intensive lifestyle modifications today with an emphasis on weight loss as well as increasing exercise and decreasing simple carbohydrates in her diet. We also reviewed medication options with an emphasis on risk versus benefit of those discussed.   Repetitive spaced learning was employed today to elicit superior memory formation and behavioral change.  4. Class 3 severe obesity with serious comorbidity and body mass index (BMI) of 40.0 to 44.9 in adult, unspecified obesity type (HCC) Kimberly Mckay is currently in the action stage of change. As such, her goal is to continue with weight loss efforts. She has agreed to following a lower carbohydrate, vegetable and lean protein rich diet plan.   Exercise goals: As is  Behavioral modification  strategies: increasing lean protein intake, decreasing simple carbohydrates, meal planning and cooking strategies and planning for success.  Kimberly Mckay has agreed to follow-up with our clinic in 2 weeks. She was informed of the importance of frequent follow-up visits to  maximize her success with intensive lifestyle modifications for her multiple health conditions.   Objective:   Blood pressure 115/74, pulse 76, temperature 98 F (36.7 C), height 5\' 7"  (1.702 m), weight 258 lb (117 kg), SpO2 96 %. Body mass index is 40.41 kg/m.  General: Cooperative, alert, well developed, in no acute distress. HEENT: Conjunctivae and lids unremarkable. Cardiovascular: Regular rhythm.  Lungs: Normal work of breathing. Neurologic: No focal deficits.   Lab Results  Component Value Date   CREATININE 0.81 03/20/2020   BUN 11 03/20/2020   NA 140 03/20/2020   K 4.1 03/20/2020   CL 104 03/20/2020   CO2 24 03/20/2020   Lab Results  Component Value Date   ALT 8 03/20/2020   AST 18 03/20/2020   ALKPHOS 101 03/20/2020   BILITOT <0.2 03/20/2020   Lab Results  Component Value Date   HGBA1C 5.5 03/20/2020   Lab Results  Component Value Date   INSULIN 16.2 03/20/2020   Lab Results  Component Value Date   TSH 1.770 03/20/2020   Lab Results  Component Value Date   CHOL 153 03/20/2020   HDL 55 03/20/2020   LDLCALC 84 03/20/2020   TRIG 72 03/20/2020   CHOLHDL 2.8 03/20/2020   Lab Results  Component Value Date   WBC 4.7 03/20/2020   HGB 13.9 03/20/2020   HCT 41.7 03/20/2020   MCV 85 03/20/2020   PLT 201 03/20/2020    Attestation Statements:   Reviewed by clinician on day of visit: allergies, medications, problem list, medical history, surgical history, family history, social history, and previous encounter notes.  03/22/2020, am acting as Edmund Kimberly Mckay for Energy manager, NP.  I have reviewed the above documentation for accuracy and completeness, and I agree with the above. -  Ciaira Natividad d. Willodene Stallings, NP-C

## 2020-05-14 NOTE — Therapy (Signed)
[facility] [place] [address] [place], [place], [address] Phone: [phone]   Fax:  [phone]  Physical Therapy Treatment  Patient Details  Name: Kimberly Mckay MRN: [id number] Date of Birth: 1980/09/04 Referring Provider (PT): Dr Marga Hoots   Encounter Date: 05/14/2020   PT End of Session - 05/14/20 0719    Visit Number 4    Number of Visits 6    Date for PT Re-Evaluation 06/09/20    Authorization Type BCBS    PT Start Time 0715    PT Stop Time 0750    PT Time Calculation (min) 35 min    Activity Tolerance Patient tolerated treatment well    Behavior During Therapy Sc[employer]x for tasks assessed/performed           Past Medical History:  Diagnosis Date  . Anemia   . Anxiety   . Asthma   . Back pain   . Constipation   . COVID-19 04/2019  . Depression   . DJD (degenerative joint disease)   . Eczema   . Fibromyalgia   . Prediabetes   . SOBOE (shortness of breath on exertion)   . Swelling of both lower extremities     No past surgical history on file.  There were no vitals filed for this visit.   Subjective Assessment - 05/14/20 0717    Subjective The patient reports her low back is feeling better today.  She continues with chronic low back pain that she reports is from degenerative changes.  She also has mid back pain.    Pertinent History Arthritis; COPD; anxiety; depression; chronic LBP; mid back pain    Patient Stated Goals help back pain    Currently in Pain? Yes    Pain Score 6     Pain Location Thoracic    Pain Orientation Mid    Pain Descriptors / Indicators Aching    Pain Type Chronic pain    Pain Onset More than a month ago    Pain Frequency Constant    Aggravating Factors  lifting, prolonged sitting    Pain Relieving Factors OTC antiflammatory              [facility] PT Assessment - 05/14/20 0720      Assessment   Medical Diagnosis Thoracic pain; mammary hypertrophy; LBP    Referring  Provider (PT) Dr Sherron Monday Dilling[place]    Onset Date/Surgical Date 02/22/10                         [facility] Adult PT Treatment/Exercise - 05/14/20 0720      Self-Care   Self-Care Other Self-Care Comments    Other Self-Care Comments  handout on body mechanics, lifting, and seated position; discussed increasing walking slowly      Exercises   Exercises Shoulder;Lumbar      Lumbar Exercises: Stretches   Lower Trunk Rotation 5 reps    Lower Trunk Rotation Limitations limited ROM    Piriformis Stretch 1 rep;30 seconds      Lumbar Exercises: Supine   Dead Bug 10 reps    Bridge 10 reps;Non-compliant      Lumbar Exercises: Quadruped   Madcat/Old Horse 10 reps    Madcat/Old Horse Limitations tactile cues for end range motion in lumbar spine    Straight Leg Raise 10 reps    Straight Leg Raises Limitations alternating toe slides into extension      Shoulder Exercises: Supine  Horizontal ABduction Strengthening;Both;10 reps    Theraband Level (Shoulder Horizontal ABduction) Level 2 (Red)    Diagonals Strengthening;Both;12 reps    Theraband Level (Shoulder Diagonals) Level 2 (Red)      Shoulder Exercises: Seated   Other Seated Exercises seated thoracic rotation x 5 reps      Shoulder Exercises: Standing   Retraction Strengthening;Both;12 reps    Other Standing Exercises Wall lean with thoracic opening R and L sides x 10 reps      Shoulder Exercises: ROM/Strengthening   UBE (Upper Arm Bike) L4 x 2 minutes forward/1.5 minute backwards      Shoulder Exercises: Stretch   Corner Stretch Limitations door frame stretch W and V x 30 seconds      Moist Heat Therapy   Number Minutes Moist Heat 10 Minutes    Moist Heat Location Lumbar Spine   while doing supine ther ex                      PT Long Term Goals - 04/28/20 0810      PT LONG TERM GOAL #1   Title Improve posture and alignment with patient to demonstrate improved upright posture with posterior shoulder  girdle musculature engaged    Time 6    Period Weeks    Status New    Target Date 06/09/20      PT LONG TERM GOAL #2   Title Increase postural strength to 4+/5 to 5/5 for middle and lower traps    Time 6    Period Weeks    Status New    Target Date 06/09/20      PT LONG TERM GOAL #3   Title Independent in HEP    Time 6    Period Weeks    Status New    Target Date 06/09/20      PT LONG TERM GOAL #4   Title Improve functional limitation score to 63    Time 6    Period Weeks    Status New    Target Date 06/09/20                 Plan - 05/14/20 0759    Clinical Impression Statement The patient has reduced pain today in her low back, but notes continued chronic pain t/o spine.  PT encouraging slow progression of daily walking in addition to stretching, ther ex during work day and changing positions throughout the day.  Plan to progress ex to tolerance.    PT Treatment/Interventions ADLs/Self Care Home Management;Aquatic Therapy;Cryotherapy;Electrical Stimulation;Iontophoresis 4mg /ml Dexamethasone;Moist Heat;Ultrasound;Therapeutic activities;Therapeutic exercise;Neuromuscular re-education;Patient/family education;Manual techniques;Dry needling;Taping    PT Next Visit Plan review HEP; TB exercises; core stabilization; progress postural strengthening; modalities as indicated thoracic extension over chair; lat pull; forward row; prone exercises as tolerated    PT Home Exercise Plan [email or web address]    Consulted and Agree with Plan of Care Patient           Patient will benefit from skilled therapeutic intervention in order to improve the following deficits and impairments:  Decreased range of motion,Impaired UE functional use,Obesity,Decreased activity tolerance,Pain,Hypomobility,Impaired flexibility,Improper body mechanics,Decreased strength,Decreased mobility,Postural dysfunction,Impaired sensation  Visit Diagnosis: Pain in thoracic spine  Abnormal posture     Problem  List Patient Active Problem List   Diagnosis Date Noted  . Back pain 04/18/2020  . Neck pain 04/18/2020  . Symptomatic mammary hypertrophy 04/18/2020  . Insulin resistance 04/15/2020  . Class 3 severe obesity with serious comorbidity  and body mass index (BMI) of 40.0 to 44.9 in adult [facility]) 04/15/2020  . Vitamin D deficiency 04/02/2020  . At risk for diabetes mellitus 04/02/2020  . Absolute anemia 04/02/2020  . Other fatigue 03/20/2020  . SOBOE (shortness of breath on exertion) 03/20/2020  . Prediabetes 03/20/2020  . Mood disorder (HCC)- emotional eating 03/20/2020  . Anemia 03/20/2020  . Other constipation 03/20/2020  . At risk for impaired metabolic function 03/20/2020    Sedra Morfin,Fawnda, PT 05/14/2020, 8:00 AM  [facility] [address] [place], [place], [address] Phone: [phone]   Fax:  [phone]  Name: Wenona [place] MRN: [id number] Date of Birth: [birth year]

## 2020-05-15 ENCOUNTER — Encounter: Payer: BC Managed Care – PPO | Admitting: Rehabilitative and Restorative Service Providers"

## 2020-05-20 ENCOUNTER — Ambulatory Visit: Payer: BC Managed Care – PPO

## 2020-05-20 ENCOUNTER — Other Ambulatory Visit: Payer: Self-pay

## 2020-05-20 ENCOUNTER — Ambulatory Visit
Admission: RE | Admit: 2020-05-20 | Discharge: 2020-05-20 | Disposition: A | Discharge status: Home / Self Care | Payer: BC Managed Care – PPO | Source: Ambulatory Visit | Attending: Plastic Surgery | Admitting: Plastic Surgery

## 2020-05-20 ENCOUNTER — Other Ambulatory Visit: Payer: Self-pay | Admitting: Plastic Surgery

## 2020-05-20 DIAGNOSIS — M546 Pain in thoracic spine: Secondary | ICD-10-CM

## 2020-05-20 DIAGNOSIS — N62 Hypertrophy of breast: Secondary | ICD-10-CM

## 2020-05-20 DIAGNOSIS — M542 Cervicalgia: Secondary | ICD-10-CM

## 2020-05-20 DIAGNOSIS — G8929 Other chronic pain: Secondary | ICD-10-CM

## 2020-05-20 DIAGNOSIS — R921 Mammographic calcification found on diagnostic imaging of breast: Secondary | ICD-10-CM | POA: Diagnosis not present

## 2020-05-20 DIAGNOSIS — N6311 Unspecified lump in the right breast, upper outer quadrant: Secondary | ICD-10-CM | POA: Diagnosis not present

## 2020-05-20 DIAGNOSIS — N644 Mastodynia: Secondary | ICD-10-CM | POA: Diagnosis not present

## 2020-05-21 ENCOUNTER — Encounter: Payer: Self-pay | Admitting: Rehabilitative and Restorative Service Providers"

## 2020-05-21 ENCOUNTER — Encounter: Payer: Self-pay | Admitting: Plastic Surgery

## 2020-05-21 ENCOUNTER — Other Ambulatory Visit: Payer: Self-pay

## 2020-05-21 ENCOUNTER — Telehealth (INDEPENDENT_AMBULATORY_CARE_PROVIDER_SITE_OTHER): Payer: BC Managed Care – PPO | Admitting: Plastic Surgery

## 2020-05-21 ENCOUNTER — Ambulatory Visit (INDEPENDENT_AMBULATORY_CARE_PROVIDER_SITE_OTHER): Payer: BC Managed Care – PPO | Admitting: Rehabilitative and Restorative Service Providers"

## 2020-05-21 DIAGNOSIS — M6281 Muscle weakness (generalized): Secondary | ICD-10-CM | POA: Diagnosis not present

## 2020-05-21 DIAGNOSIS — R293 Abnormal posture: Secondary | ICD-10-CM | POA: Diagnosis not present

## 2020-05-21 DIAGNOSIS — M546 Pain in thoracic spine: Secondary | ICD-10-CM

## 2020-05-21 DIAGNOSIS — N62 Hypertrophy of breast: Secondary | ICD-10-CM

## 2020-05-21 NOTE — Therapy (Signed)
[facility] [place] [address] [place], [place], [address] Phone: [phone]   Fax:  [phone]  Physical Therapy Treatment  Patient Details  Name: Natayla Cadenhead MRN: [id number] Date of Birth: [birth year] Referring Provider (PT): Dr Marga Hoots   Encounter Date: 05/21/2020   PT End of Session - 05/21/20 0716    Visit Number 5    Number of Visits 6    Date for PT Re-Evaluation 06/09/20    Authorization Type BCBS    PT Start Time 0715    PT Stop Time 0751    PT Time Calculation (min) 36 min    Activity Tolerance Patient tolerated treatment well           Past Medical History:  Diagnosis Date  . Anemia   . Anxiety   . Asthma   . Back pain   . Constipation   . COVID-19 04/2019  . Depression   . DJD (degenerative joint disease)   . Eczema   . Fibromyalgia   . Prediabetes   . SOBOE (shortness of breath on exertion)   . Swelling of both lower extremities     History reviewed. No pertinent surgical history.  There were no vitals filed for this visit.   Subjective Assessment - 05/21/20 0716    Subjective Some abnormal findings in her mammogram yesterday. Will follow up with surgeon today.  Midback is about the same. LB is calming down a bit.    Pertinent History Fibromyalgia; anxiety; depression; chronic LBP; mid back pain    Currently in Pain? Yes    Pain Score 8     Pain Location Thoracic    Pain Orientation Mid    Pain Descriptors / Indicators Aching    Pain Type Chronic pain                             [facility] Adult PT Treatment/Exercise - 05/21/20 0001      Lumbar Exercises: Stretches   Lower Trunk Rotation 5 reps    Lower Trunk Rotation Limitations limited ROM      Lumbar Exercises: Supine   Ab Set 10 reps   10 sec hold   Dead Bug 10 reps;1 second    Dead Bug Limitations painful LB      Shoulder Exercises: Supine   Horizontal ABduction Strengthening;Both;20 reps;Theraband     Theraband Level (Shoulder Horizontal ABduction) Level 2 (Red)    Flexion Strengthening;Both;20 reps;Theraband   holding TB btn hands at shoulder width   Theraband Level (Shoulder Flexion) Level 2 (Red)    Diagonals Strengthening;Both;20 reps;Theraband    Theraband Level (Shoulder Diagonals) Level 2 (Red)      Shoulder Exercises: Seated   Other Seated Exercises seated thoracic extension hands behind head 10 sec x 5; rotation x 5 reps      Shoulder Exercises: Standing   Extension Strengthening;Both;20 reps;Theraband    Theraband Level (Shoulder Extension) Level 4 (Blue)    Row Strengthening;Both;20 reps;Theraband    Theraband Level (Shoulder Row) Level 4 (Blue)    Row Limitations bow and arrow blue TB x 10 reps each side x 2 sets    Retraction Strengthening;Both;20 reps;Theraband    Theraband Level (Shoulder Retraction) Level 2 (Red)      Shoulder Exercises: ROM/Strengthening   UBE (Upper Arm Bike) L4 x alternating fwd/back each minute      Shoulder Exercises: Body Blade   Flexion 30 seconds;3 reps  3 reps each UE                      PT Long Term Goals - 04/28/20 0810      PT LONG TERM GOAL #1   Title Improve posture and alignment with patient to demonstrate improved upright posture with posterior shoulder girdle musculature engaged    Time 6    Period Weeks    Status New    Target Date 06/09/20      PT LONG TERM GOAL #2   Title Increase postural strength to 4+/5 to 5/5 for middle and lower traps    Time 6    Period Weeks    Status New    Target Date 06/09/20      PT LONG TERM GOAL #3   Title Independent in HEP    Time 6    Period Weeks    Status New    Target Date 06/09/20      PT LONG TERM GOAL #4   Title Improve functional limitation score to 63    Time 6    Period Weeks    Status New    Target Date 06/09/20                 Plan - 05/21/20 0720    Clinical Impression Statement Patient reports no change in midback pain. She has done  some exercises at home. Patient continues to have pain and discomfort in the mid and LB. Working on strengthening in standing and supine.    Rehab Potential Good    PT Frequency 2x / week    PT Duration 6 weeks    PT Treatment/Interventions ADLs/Self Care Home Management;Aquatic Therapy;Cryotherapy;Electrical Stimulation;Iontophoresis 4mg /ml Dexamethasone;Moist Heat;Ultrasound;Therapeutic activities;Therapeutic exercise;Neuromuscular re-education;Patient/family education;Manual techniques;Dry needling;Taping    PT Next Visit Plan review HEP; TB exercises; core stabilization; progress postural strengthening; modalities as indicated thoracic extension over chair; lat pull; forward row; prone exercises as tolerated    PT Home Exercise Plan [email or web address]    Consulted and Agree with Plan of Care Patient           Patient will benefit from skilled therapeutic intervention in order to improve the following deficits and impairments:     Visit Diagnosis: Pain in thoracic spine  Abnormal posture  Muscle weakness (generalized)     Problem List Patient Active Problem List   Diagnosis Date Noted  . Back pain 04/18/2020  . Neck pain 04/18/2020  . Symptomatic mammary hypertrophy 04/18/2020  . Insulin resistance 04/15/2020  . Class 3 severe obesity with serious comorbidity and body mass index (BMI) of 40.0 to 44.9 in adult [facility]) 04/15/2020  . Vitamin D deficiency 04/02/2020  . At risk for diabetes mellitus 04/02/2020  . Absolute anemia 04/02/2020  . Other fatigue 03/20/2020  . SOBOE (shortness of breath on exertion) 03/20/2020  . Prediabetes 03/20/2020  . Mood disorder (HCC)- emotional eating 03/20/2020  . Anemia 03/20/2020  . Other constipation 03/20/2020  . At risk for impaired metabolic function 03/20/2020    Dinah Lupa 03/22/2020 PT, MPH  05/21/2020, 7:50 AM  [facility] [address] [place], [place], [place] Phone: [phone]    Fax:  [phone]  Name: Adaja Wander MRN: [name] Date of Birth: 20-Dec-1980

## 2020-05-22 ENCOUNTER — Encounter: Payer: Self-pay | Admitting: Plastic Surgery

## 2020-05-22 ENCOUNTER — Other Ambulatory Visit: Payer: Self-pay

## 2020-05-22 ENCOUNTER — Ambulatory Visit (INDEPENDENT_AMBULATORY_CARE_PROVIDER_SITE_OTHER): Payer: BC Managed Care – PPO | Admitting: Rehabilitative and Restorative Service Providers"

## 2020-05-22 ENCOUNTER — Encounter: Payer: Self-pay | Admitting: Rehabilitative and Restorative Service Providers"

## 2020-05-22 DIAGNOSIS — M546 Pain in thoracic spine: Secondary | ICD-10-CM | POA: Diagnosis not present

## 2020-05-22 DIAGNOSIS — M6281 Muscle weakness (generalized): Secondary | ICD-10-CM | POA: Diagnosis not present

## 2020-05-22 DIAGNOSIS — R293 Abnormal posture: Secondary | ICD-10-CM

## 2020-05-22 NOTE — Therapy (Addendum)
[facility] [place], [place], [address] Phone: [phone]   Fax:  [phone]  Physical Therapy Treatment  Patient Details  Name: Kimberly Mckay MRN: [id number] Date of Birth: 02/08/1981 Referring Provider (PT): Dr Sharmaine Base   Encounter Date: 05/22/2020   PT End of Session - 05/22/20 1711    Visit Number 6    Number of Visits 6    Date for PT Re-Evaluation 06/09/20    Authorization Type BCBS    PT Start Time 1704    PT Stop Time 1745    PT Time Calculation (min) 41 min    Activity Tolerance Patient tolerated treatment well           Past Medical History:  Diagnosis Date  . Anemia   . Anxiety   . Asthma   . Back pain   . Constipation   . COVID-19 04/2019  . Depression   . DJD (degenerative joint disease)   . Eczema   . Fibromyalgia   . Prediabetes   . SOBOE (shortness of breath on exertion)   . Swelling of both lower extremities     History reviewed. No pertinent surgical history.  There were no vitals filed for this visit.   Subjective Assessment - 05/22/20 1711    Subjective Patient reports no significant change in midback pai nwith PT and home program. She has continued pain in the mid to low back.    Currently in Pain? Yes    Pain Score 4     Pain Location Thoracic    Pain Orientation Mid    Pain Descriptors / Indicators Aching    Pain Type Chronic pain              [facility] PT Assessment - 05/22/20 0001      Assessment   Medical Diagnosis Thoracic pain; mammary hypertrophy; LBP    Referring Provider (PT) Dr Wilfred Curtis Dillingham    Onset Date/Surgical Date 02/22/10    Hand Dominance Right    Next MD Visit not scheduled    Prior Therapy none      Observation/Other Assessments   Focus on Therapeutic Outcomes (FOTO)  62      AROM   Cervical Flexion 30    Cervical Extension 29    Cervical - Right Side Bend 25    Cervical - Left Side Bend 31    Cervical - Right  Rotation 60    Cervical - Left Rotation 65    Lumbar Flexion 50%    Lumbar Extension 50%    Lumbar - Right Side Bend 60% pulling    Lumbar - Left Side Bend 40% tightness and pain Rt lumbar    Lumbar - Right Rotation 30% painful Rt mid to low back/side    Lumbar - Left Rotation 30% no pain      Strength   Overall Strength Comments Bilat UE strength      Palpation   Spinal mobility hypomobility and pain with PA mobs lumbar; thoracic; cervical spine    Palpation comment muscular tightness and pain with palpation through the pecs; upper trap; leveator; scapular paraspinals; lumbar paraspinals into bilat posterior hips                         [facility] Adult PT Treatment/Exercise - 05/22/20 0001      Lumbar Exercises: Supine   Ab Set 10 reps   10 sec hold  Shoulder Exercises: Supine   Horizontal ABduction Strengthening;Both;20 reps;Theraband    Theraband Level (Shoulder Horizontal ABduction) Level 2 (Red)    Flexion Strengthening;Both;20 reps;Theraband   holding TB btn hands at shoulder width   Theraband Level (Shoulder Flexion) Level 2 (Red)    Diagonals Strengthening;Both;20 reps;Theraband    Theraband Level (Shoulder Diagonals) Level 2 (Red)    Other Supine Exercises shoulder press 3# weigths x 10 each UE; shoulder stabilization shd at 90 deg flexion for small range flex/ext; horiz ab/ad; circles CW/CCW x 10-15 each exercise each UE    Other Supine Exercises maintaining shd abduction w/red TB bringing UE's into shd flexion and back to neutral x 10 reps      Shoulder Exercises: Seated   Other Seated Exercises seated thoracic extension hands behind head 10 sec x 5; rotation x 5 reps      Shoulder Exercises: Standing   Extension Strengthening;Both;20 reps;Theraband    Theraband Level (Shoulder Extension) Level 4 (Blue)    Row Strengthening;Both;20 reps;Theraband    Theraband Level (Shoulder Row) Level 4 (Blue)    Row Limitations bow and arrow blue TB x 10 reps each  side x 2 sets    Retraction Strengthening;Both;20 reps;Theraband    Theraband Level (Shoulder Retraction) Level 2 (Red)      Shoulder Exercises: ROM/Strengthening   UBE (Upper Arm Bike) L4 x alternating fwd/back each minute      Shoulder Exercises: Stretch   Other Shoulder Stretches prolonged snow angel supine hooklying UE's at ~ 90 deg x ~ 2 min    Other Shoulder Stretches doorway 3 positions x 30 sec hold x 2 reps      Shoulder Exercises: Body Blade   Flexion 30 seconds;3 reps   3 reps each UE     Moist Heat Therapy   Number Minutes Moist Heat 10 Minutes    Moist Heat Location Lumbar Spine   while exercising                      PT Long Term Goals - 05/22/20 1722      PT LONG TERM GOAL #1   Title Improve posture and alignment with patient to demonstrate improved upright posture with posterior shoulder girdle musculature engaged    Time 6    Period Weeks    Status Partially Met      PT LONG TERM GOAL #2   Title Increase postural strength to 4+/5 to 5/5 for middle and lower traps    Time 6    Period Weeks    Status Achieved      PT LONG TERM GOAL #3   Title Independent in HEP    Time 6    Period Weeks    Status Achieved      PT LONG TERM GOAL #4   Title Improve functional limitation score to 63    Time 6    Period Weeks    Status Partially Met                 Plan - 05/22/20 1735    Clinical Impression Statement Patient reports no change in midback pain. She demonstrates increased strength in middle/lower traps and increased lumbar mobility/ROM. Cervical mobility/ROM is limited in some ranges but improved in cervical rotation. she is independent in HEP and has been encouraged to continue with consistent HEP. Goals of therapy have been partially accomplished. Patient will be discharged from PT to independent HEP. She was encouraged to  call with any questions or problems.    Rehab Potential Good    PT Frequency 2x / week    PT Duration 6 weeks     PT Treatment/Interventions ADLs/Self Care Home Management;Aquatic Therapy;Cryotherapy;Electrical Stimulation;Iontophoresis 22m/ml Dexamethasone;Moist Heat;Ultrasound;Therapeutic activities;Therapeutic exercise;Neuromuscular re-education;Patient/family education;Manual techniques;Dry needling;Taping    PT Next Visit Plan d/c to independent HEP    PT Home Exercise Plan [id number]    Consulted and Agree with Plan of Care Patient           Patient will benefit from skilled therapeutic intervention in order to improve the following deficits and impairments:     Visit Diagnosis: Pain in thoracic spine  Abnormal posture  Muscle weakness (generalized)     Problem List Patient Active Problem List   Diagnosis Date Noted  . Back pain 04/18/2020  . Neck pain 04/18/2020  . Symptomatic mammary hypertrophy 04/18/2020  . Insulin resistance 04/15/2020  . Class 3 severe obesity with serious comorbidity and body mass index (BMI) of 40.0 to 44.9 in adult ([facility] 04/15/2020  . Vitamin D deficiency 04/02/2020  . At risk for diabetes mellitus 04/02/2020  . Absolute anemia 04/02/2020  . Other fatigue 03/20/2020  . SOBOE (shortness of breath on exertion) 03/20/2020  . Prediabetes 03/20/2020  . Mood disorder ([facility] emotional eating 03/20/2020  . Anemia 03/20/2020  . Other constipation 03/20/2020  . At risk for impaired metabolic function 010/31/2811   Syrenity Klepacki PNilda SimmerPT, MPH  05/22/2020, 5:41 PM  [facility] [address] [address]Phone: [phone]  Fax:  [phone] Name: Kimberly OverackerMRN: [id number] of Birth: [birth year] PHYSICAL THERAPY DISCHARGE SUMMARY  Visits from Start of Care: 6  Current functional level related to goals / functional outcomes: See progress note   Remaining deficits: Unknown    Education / Equipment: HEP  Plan: Patient agrees to discharge.  Patient goals were partially met.  Patient is being discharged due to                                                     ?????     Surgery for breast reduction scheduled.   Kimberly Mckay P. HHelene KelpPT, MPH 07/08/20 9:02 AM

## 2020-05-22 NOTE — Progress Notes (Signed)
The patient is a 40 yrs old female joining me by phone.  She underwent the MM and [place] of her breasts on 3/29.  The exams showed an area in right upper-outer breast of calcification and small oval circumscribed mass.  A biopsy is planned for 4/4.  We will talk after the results are known.     The patient gave consent to have this visit done by telemedicine / virtual visit.  This is also consent for access the chart and treat the patient via this visit. The patient is located not at home but in [place].  I, the provider, am at the office.  We spent 10 minutes together for the visit.

## 2020-05-26 ENCOUNTER — Ambulatory Visit
Admission: RE | Admit: 2020-05-26 | Discharge: 2020-05-26 | Disposition: A | Discharge status: Home / Self Care | Payer: BC Managed Care – PPO | Source: Ambulatory Visit | Attending: Plastic Surgery | Admitting: Plastic Surgery

## 2020-05-26 ENCOUNTER — Other Ambulatory Visit (INDEPENDENT_AMBULATORY_CARE_PROVIDER_SITE_OTHER): Payer: Self-pay

## 2020-05-26 ENCOUNTER — Other Ambulatory Visit: Payer: Self-pay | Admitting: Plastic Surgery

## 2020-05-26 ENCOUNTER — Other Ambulatory Visit: Payer: Self-pay

## 2020-05-26 DIAGNOSIS — N6311 Unspecified lump in the right breast, upper outer quadrant: Secondary | ICD-10-CM | POA: Diagnosis not present

## 2020-05-26 DIAGNOSIS — G8929 Other chronic pain: Secondary | ICD-10-CM

## 2020-05-26 DIAGNOSIS — M542 Cervicalgia: Secondary | ICD-10-CM

## 2020-05-26 DIAGNOSIS — E8881 Metabolic syndrome: Secondary | ICD-10-CM

## 2020-05-26 DIAGNOSIS — N62 Hypertrophy of breast: Secondary | ICD-10-CM

## 2020-05-26 DIAGNOSIS — M546 Pain in thoracic spine: Secondary | ICD-10-CM

## 2020-05-26 DIAGNOSIS — D241 Benign neoplasm of right breast: Secondary | ICD-10-CM | POA: Diagnosis not present

## 2020-05-26 DIAGNOSIS — N6011 Diffuse cystic mastopathy of right breast: Secondary | ICD-10-CM | POA: Diagnosis not present

## 2020-05-26 DIAGNOSIS — R921 Mammographic calcification found on diagnostic imaging of breast: Secondary | ICD-10-CM | POA: Diagnosis not present

## 2020-05-26 MED ORDER — METFORMIN HCL 500 MG PO TABS: 500.0000 mg | ORAL_TABLET | Freq: Two times a day (BID) | ORAL | 0 refills | Status: DC

## 2020-05-27 ENCOUNTER — Ambulatory Visit (INDEPENDENT_AMBULATORY_CARE_PROVIDER_SITE_OTHER): Payer: BC Managed Care – PPO | Admitting: Family Medicine

## 2020-05-29 ENCOUNTER — Encounter: Payer: Self-pay | Admitting: Plastic Surgery

## 2020-05-29 ENCOUNTER — Ambulatory Visit (INDEPENDENT_AMBULATORY_CARE_PROVIDER_SITE_OTHER): Payer: BC Managed Care – PPO | Admitting: Family Medicine

## 2020-06-16 ENCOUNTER — Encounter (INDEPENDENT_AMBULATORY_CARE_PROVIDER_SITE_OTHER): Payer: Self-pay | Admitting: Adult Health

## 2020-06-16 ENCOUNTER — Other Ambulatory Visit: Payer: Self-pay

## 2020-06-16 ENCOUNTER — Ambulatory Visit (INDEPENDENT_AMBULATORY_CARE_PROVIDER_SITE_OTHER): Payer: BC Managed Care – PPO | Admitting: Adult Health

## 2020-06-16 VITALS — BP 119/82 | HR 58 | Temp 98.0°F | Ht 67.0 in | Wt 265.0 lb

## 2020-06-16 DIAGNOSIS — R632 Polyphagia: Secondary | ICD-10-CM | POA: Diagnosis not present

## 2020-06-16 DIAGNOSIS — Z6841 Body Mass Index (BMI) 40.0 and over, adult: Secondary | ICD-10-CM

## 2020-06-16 DIAGNOSIS — E559 Vitamin D deficiency, unspecified: Secondary | ICD-10-CM | POA: Diagnosis not present

## 2020-06-16 DIAGNOSIS — E8881 Metabolic syndrome: Secondary | ICD-10-CM | POA: Diagnosis not present

## 2020-06-16 DIAGNOSIS — Z9189 Other specified personal risk factors, not elsewhere classified: Secondary | ICD-10-CM | POA: Diagnosis not present

## 2020-06-16 MED ORDER — VITAMIN D (ERGOCALCIFEROL) 1.25 MG (50000 UNIT) PO CAPS: 50000.0000 [IU] | ORAL_CAPSULE | ORAL | 0 refills | Status: DC

## 2020-06-16 MED ORDER — TOPIRAMATE 25 MG PO TABS: 25.0000 mg | ORAL_TABLET | Freq: Every day | ORAL | 0 refills | Status: DC

## 2020-06-16 MED ORDER — METFORMIN HCL 500 MG PO TABS: 500.0000 mg | ORAL_TABLET | Freq: Two times a day (BID) | ORAL | 0 refills | Status: DC

## 2020-06-16 NOTE — Progress Notes (Signed)
Chief Complaint:   OBESITY Kimberly Mckay is here to discuss her progress with her obesity treatment plan along with follow-up of her obesity related diagnoses. Kimberly Mckay is on following a lower carbohydrate, vegetable and lean protein rich diet plan and states she is following her eating plan approximately 50% of the time. [name] states she is walking 30 minutes 3 times per week.  Today's visit was #: 6 Starting weight: 254 lbs Starting date: 03/20/2020 Today's weight: 265 lbs Today's date: 06/16/2020 Total lbs lost to date: 0 Total lbs lost since last in-office visit: 0  Interim History: Despite Metformin 500 mg BID, Mariell continues to experience significant polyphagia.  Laddie's mom was diagnosed with thyroid cancer and had a thyroidectomy- now in remission.  GLP-1 therapy would not be indicated for her.  Subjective:   1. Vitamin D deficiency Kimberly Mckay's Vitamin D level was 19.7 on 03/20/2020, which is well below goal of 50.  She is currently taking prescription vitamin D 50,000 IU each week. She denies nausea, vomiting or muscle weakness.  2. Insulin resistance Kimberly Mckay's mom was diagnosed with thyroid cancer and had a thyroidectomy- now in remission. GLP-1 therapy not indicated. She is on Metformin 500 mg BID and tolerating it well, however, still experiencing significant polyphagia.  Lab Results  Component Value Date   INSULIN 16.2 03/20/2020   Lab Results  Component Value Date   HGBA1C 5.5 03/20/2020    3. Polyphagia [name] continues to experience significant polyphagia, despite Metformin 500 mg BID. She denies history of seizures or nephrolithiasis. She has a history of recurrent headache.    4. At risk for osteoporosis [name] is at higher risk of osteopenia and osteoporosis due to Vitamin D deficiency.  Assessment/Plan:   1. Vitamin D deficiency Low Vitamin D level contributes to fatigue and are associated with obesity, breast, and colon cancer.  She agrees to continue to take prescription Vitamin D @50 ,000 IU every week and will follow-up for routine testing of Vitamin D, at least 2-3 times per year to avoid over-replacement. Check labs at next OV.  - Vitamin D, Ergocalciferol, (DRISDOL) 1.25 MG (50000 UNIT) CAPS capsule; Take 1 capsule (50,000 Units total) by mouth every 7 (seven) days.  Dispense: 4 capsule; Refill: 0  2. Insulin resistance Kimberly Mckay will continue to work on weight loss, exercise, and decreasing simple carbohydrates to help decrease the risk of diabetes. Kimberly Mckay agreed to follow-up with [name] as directed to closely monitor her progress. Check labs at next OV.  - metFORMIN (GLUCOPHAGE) 500 MG tablet; Take 1 tablet (500 mg total) by mouth 2 (two) times daily with a meal.  Dispense: 60 tablet; Refill: 0  3. Polyphagia Intensive lifestyle modifications are the first line treatment for this issue. We discussed several lifestyle modifications today and she will continue to work on diet, exercise and weight loss efforts. Orders and follow up as documented in patient record.  Start topiramate 25 mg QHS, as prescribed below.   Counseling . Polyphagia is excessive hunger. . Causes can include: low blood sugars, hypERthyroidism, PMS, lack of sleep, stress, insulin resistance, diabetes, certain medications, and diets that are deficient in protein and fiber.   - topiramate (TOPAMAX) 25 MG tablet; Take 1 tablet (25 mg total) by mouth at bedtime.  Dispense: 30 tablet; Refill: 0  4. At risk for osteoporosis Kimberly Mckay was given approximately 15 minutes of osteoporosis prevention counseling today. Kimberly Mckay is at risk for osteopenia and osteoporosis due to her Vitamin D deficiency. She was encouraged  to take her Vitamin D and follow her higher calcium diet and increase strengthening exercise to help strengthen her bones and decrease her risk of osteopenia and osteoporosis.  Repetitive spaced learning was employed today to elicit  superior memory formation and behavioral change.  5. Class 3 severe obesity with serious comorbidity and body mass index (BMI) of 40.0 to 44.9 in adult, unspecified obesity type (HCC) Kimberly Mckay is currently in the action stage of change. As such, her goal is to continue with weight loss efforts. She has agreed to following a lower carbohydrate, vegetable and lean protein rich diet plan.   Fasting labs at next OV.  Exercise goals: As is  Behavioral modification strategies: increasing lean protein intake, decreasing simple carbohydrates, meal planning and cooking strategies, better snacking choices and planning for success.  [name] has agreed to follow-up with our clinic in 2 weeks. She was informed of the importance of frequent follow-up visits to maximize her success with intensive lifestyle modifications for her multiple health conditions.   Objective:   Blood pressure 119/82, pulse (!) 58, temperature 98 F (36.7 C), height 5\' 7"  (1.702 m), weight 265 lb (120.2 kg), SpO2 98 %. Body mass index is 41.5 kg/m.  General: Cooperative, alert, well developed, in no acute distress. HEENT: Conjunctivae and lids unremarkable. Cardiovascular: Regular rhythm.  Lungs: Normal work of breathing. Neurologic: No focal deficits.   Lab Results  Component Value Date   CREATININE 0.81 03/20/2020   BUN 11 03/20/2020   NA 140 03/20/2020   K 4.1 03/20/2020   CL 104 03/20/2020   CO2 24 03/20/2020   Lab Results  Component Value Date   ALT 8 03/20/2020   AST 18 03/20/2020   ALKPHOS 101 03/20/2020   BILITOT <0.2 03/20/2020   Lab Results  Component Value Date   HGBA1C 5.5 03/20/2020   Lab Results  Component Value Date   INSULIN 16.2 03/20/2020   Lab Results  Component Value Date   TSH 1.770 03/20/2020   Lab Results  Component Value Date   CHOL 153 03/20/2020   HDL 55 03/20/2020   LDLCALC 84 03/20/2020   TRIG 72 03/20/2020   CHOLHDL 2.8 03/20/2020   Lab Results  Component Value  Date   WBC 4.7 03/20/2020   HGB 13.9 03/20/2020   HCT 41.7 03/20/2020   MCV 85 03/20/2020   PLT 201 03/20/2020    Attestation Statements:   Reviewed by clinician on day of visit: allergies, medications, problem list, medical history, surgical history, family history, social history, and previous encounter notes.  03/22/2020, am acting as Edmund Hilda for Energy manager, NP.  I have reviewed the above documentation for accuracy and completeness, and I agree with the above. -  Laurey Salser d. Leonora Gores, NP-C

## 2020-06-19 ENCOUNTER — Encounter: Payer: Self-pay | Admitting: Plastic Surgery

## 2020-06-19 NOTE — Telephone Encounter (Signed)
Any updates from insurance?

## 2020-07-03 ENCOUNTER — Emergency Department
Admission: RE | Admit: 2020-07-03 | Discharge: 2020-07-03 | Disposition: A | Discharge status: Home / Self Care | Payer: BC Managed Care – PPO | Source: Ambulatory Visit

## 2020-07-03 ENCOUNTER — Other Ambulatory Visit: Payer: Self-pay

## 2020-07-03 ENCOUNTER — Telehealth (INDEPENDENT_AMBULATORY_CARE_PROVIDER_SITE_OTHER): Payer: BC Managed Care – PPO | Admitting: Adult Health

## 2020-07-03 VITALS — BP 136/86 | HR 91 | Temp 98.6°F | Resp 20 | Ht 67.0 in | Wt 163.0 lb

## 2020-07-03 DIAGNOSIS — B349 Viral infection, unspecified: Secondary | ICD-10-CM

## 2020-07-03 DIAGNOSIS — R6889 Other general symptoms and signs: Secondary | ICD-10-CM

## 2020-07-03 MED ORDER — DIPHENOXYLATE-ATROPINE 2.5-0.025 MG PO TABS: 1.0000 | ORAL_TABLET | Freq: Four times a day (QID) | ORAL | 0 refills | Status: DC | PRN

## 2020-07-03 NOTE — ED Triage Notes (Signed)
Pt presents to Urgent Care with c/o generalized body aches and diarrhea x 2 days. Also reports fever and N/V 2 days ago, which has now subsided. Home COVID test was negative; pt not vaccinated against COVID or flu.

## 2020-07-03 NOTE — Discharge Instructions (Signed)
Drink plenty of water, Gatorade, fluids Take Lomotil as needed for diarrhea and abdominal pain Test results will be available in a couple of days You can check for your test results on MyChart Be sure to quarantine/isolate yourself until your results are available

## 2020-07-05 LAB — COVID-19, FLU A+B NAA
Influenza A, NAA: NOT DETECTED
Influenza B, NAA: NOT DETECTED
SARS-CoV-2, NAA: NOT DETECTED

## 2020-07-07 ENCOUNTER — Other Ambulatory Visit (INDEPENDENT_AMBULATORY_CARE_PROVIDER_SITE_OTHER): Payer: Self-pay | Admitting: Adult Health

## 2020-07-07 ENCOUNTER — Encounter (INDEPENDENT_AMBULATORY_CARE_PROVIDER_SITE_OTHER): Payer: Self-pay | Admitting: Adult Health

## 2020-07-07 DIAGNOSIS — E559 Vitamin D deficiency, unspecified: Secondary | ICD-10-CM

## 2020-07-07 MED ORDER — VITAMIN D (ERGOCALCIFEROL) 1.25 MG (50000 UNIT) PO CAPS: 50000.0000 [IU] | ORAL_CAPSULE | ORAL | 0 refills | Status: DC

## 2020-07-07 NOTE — Telephone Encounter (Signed)
Refill request for Vit. Df/u appot. 07/28/20

## 2020-07-28 ENCOUNTER — Encounter (INDEPENDENT_AMBULATORY_CARE_PROVIDER_SITE_OTHER): Payer: Self-pay | Admitting: Adult Health

## 2020-07-28 ENCOUNTER — Ambulatory Visit (INDEPENDENT_AMBULATORY_CARE_PROVIDER_SITE_OTHER): Payer: BC Managed Care – PPO | Admitting: Adult Health

## 2020-07-28 ENCOUNTER — Other Ambulatory Visit: Payer: Self-pay

## 2020-07-28 VITALS — BP 115/75 | HR 64 | Temp 98.1°F | Ht 67.0 in | Wt 267.0 lb

## 2020-07-28 DIAGNOSIS — E8881 Metabolic syndrome: Secondary | ICD-10-CM

## 2020-07-28 DIAGNOSIS — Z9189 Other specified personal risk factors, not elsewhere classified: Secondary | ICD-10-CM | POA: Diagnosis not present

## 2020-07-28 DIAGNOSIS — E559 Vitamin D deficiency, unspecified: Secondary | ICD-10-CM

## 2020-07-28 DIAGNOSIS — Z6841 Body Mass Index (BMI) 40.0 and over, adult: Secondary | ICD-10-CM

## 2020-07-28 DIAGNOSIS — R632 Polyphagia: Secondary | ICD-10-CM | POA: Diagnosis not present

## 2020-07-28 MED ORDER — METFORMIN HCL 500 MG PO TABS: 500.0000 mg | ORAL_TABLET | Freq: Two times a day (BID) | ORAL | 0 refills | Status: DC

## 2020-07-28 MED ORDER — VITAMIN D (ERGOCALCIFEROL) 1.25 MG (50000 UNIT) PO CAPS: 50000.0000 [IU] | ORAL_CAPSULE | ORAL | 0 refills | Status: DC

## 2020-07-30 NOTE — Progress Notes (Signed)
Chief Complaint:   OBESITY Kimberly Mckay is here to discuss her progress with her obesity treatment plan along with follow-up of her obesity related diagnoses. Kimberly Mckay is on following a lower carbohydrate, vegetable and lean protein rich diet plan and states she is following her eating plan approximately 0% of the time. Kimberly Mckay states she is not currently exercising.  Today's visit was #: 7 Starting weight: 254 lbs Starting date: 03/20/2020 Today's weight: 267 lbs Today's date: 07/28/2020 Total lbs lost to date: 0 Total lbs lost since last in-office visit: 0  Interim History: Kimberly Mckay is scheduled for bilateral breast reduction 09/03/2020. Her current breast size is DD, and she will reduce to large B or small C cup. She did not start topiramate. She wants to re-start Metformin.  Subjective:   1. Vitamin D deficiency Torrin's Vitamin D level was 19.7 on 03/20/2020- well below goal of 50. She is currently taking prescription vitamin D 50,000 IU each week. She denies nausea, vomiting or muscle weakness.  2. Insulin resistance Kimberly Mckay has been off Metformin 500 mg BID for several weeks. She would like to re-start.  Lab Results  Component Value Date   INSULIN 16.2 03/20/2020   Lab Results  Component Value Date   HGBA1C 5.5 03/20/2020   3. Polyphagia Kimberly Mckay was started on topiramate due to polyphagia despite already being on Metformin 500 mg BID. She never started the topiramate due to concerns of side effects.  4. At risk for osteoporosis Kimberly Mckay is at higher risk of osteopenia and osteoporosis due to Vitamin D deficiency and obesity.  Assessment/Plan:   1. Vitamin D deficiency Low Vitamin D level contributes to fatigue and are associated with obesity, breast, and colon cancer. She agrees to continue to take prescription Vitamin D @50 ,000 IU every week and will follow-up for routine testing of Vitamin D, at least 2-3 times per year to avoid over-replacement.  -  Vitamin D, Ergocalciferol, (DRISDOL) 1.25 MG (50000 UNIT) CAPS capsule; Take 1 capsule (50,000 Units total) by mouth every 7 (seven) days.  Dispense: 4 capsule; Refill: 0  2. Insulin resistance Kimberly Mckay will continue to work on weight loss, exercise, and decreasing simple carbohydrates to help decrease the risk of diabetes. Kimberly Mckay agreed to follow-up with [name] as directed to closely monitor her progress.  - metFORMIN (GLUCOPHAGE) 500 MG tablet; Take 1 tablet (500 mg total) by mouth 2 (two) times daily with a meal.  Dispense: 60 tablet; Refill: 0  3. Polyphagia Intensive lifestyle modifications are the first line treatment for this issue. We discussed several lifestyle modifications today and she will continue to work on diet, exercise and weight loss efforts. Orders and follow up as documented in patient record. -Remain off topiramate.  Counseling . Polyphagia is excessive hunger. . Causes can include: low blood sugars, hypERthyroidism, PMS, lack of sleep, stress, insulin resistance, diabetes, certain medications, and diets that are deficient in protein and fiber.   4. At risk for osteoporosis Kimberly Mckay was given approximately 15 minutes of osteoporosis prevention counseling today. Kimberly Mckay is at risk for osteopenia and osteoporosis due to her Vitamin D deficiency. She was encouraged to take her Vitamin D and follow her higher calcium diet and increase strengthening exercise to help strengthen her bones and decrease her risk of osteopenia and osteoporosis.  Repetitive spaced learning was employed today to elicit superior memory formation and behavioral change.  5. Class 3 severe obesity with serious comorbidity and body mass index (BMI) of 40.0 to 44.9 in adult, unspecified  obesity type (HCC)  Kimberly Mckay is currently in the action stage of change. As such, her goal is to continue with weight loss efforts. She has agreed to the Category 3 Plan and intermittent fasting.   -Check fasting labs  at next OV.  Exercise goals: Walk on treadmill 2 times a week and swim once a week.  Behavioral modification strategies: increasing lean protein intake, decreasing simple carbohydrates, meal planning and cooking strategies, keeping healthy foods in the home and planning for success.  Kimberly Mckay has agreed to follow-up with our clinic in 3 weeks. She was informed of the importance of frequent follow-up visits to maximize her success with intensive lifestyle modifications for her multiple health conditions.   Objective:   Blood pressure 115/75, pulse 64, temperature 98.1 F (36.7 C), height 5\' 7"  (1.702 m), weight 267 lb (121.1 kg), SpO2 100 %. Body mass index is 41.82 kg/m.  General: Cooperative, alert, well developed, in no acute distress. HEENT: Conjunctivae and lids unremarkable. Cardiovascular: Regular rhythm.  Lungs: Normal work of breathing. Neurologic: No focal deficits.   Lab Results  Component Value Date   CREATININE 0.81 03/20/2020   BUN 11 03/20/2020   NA 140 03/20/2020   K 4.1 03/20/2020   CL 104 03/20/2020   CO2 24 03/20/2020   Lab Results  Component Value Date   ALT 8 03/20/2020   AST 18 03/20/2020   ALKPHOS 101 03/20/2020   BILITOT <0.2 03/20/2020   Lab Results  Component Value Date   HGBA1C 5.5 03/20/2020   Lab Results  Component Value Date   INSULIN 16.2 03/20/2020   Lab Results  Component Value Date   TSH 1.770 03/20/2020   Lab Results  Component Value Date   CHOL 153 03/20/2020   HDL 55 03/20/2020   LDLCALC 84 03/20/2020   TRIG 72 03/20/2020   CHOLHDL 2.8 03/20/2020   Lab Results  Component Value Date   WBC 4.7 03/20/2020   HGB 13.9 03/20/2020   HCT 41.7 03/20/2020   MCV 85 03/20/2020   PLT 201 03/20/2020   No results found for: IRON, TIBC, FERRITIN  Attestation Statements:   Reviewed by clinician on day of visit: allergies, medications, problem list, medical history, surgical history, family history, social history, and previous  encounter notes.  03/22/2020, CMA, am acting as transcriptionist for Edmund Hilda, NP.  I have reviewed the above documentation for accuracy and completeness, and I agree with the above. -  Aaliyana Fredericks d. Danaja [place], NP-C

## 2020-08-19 ENCOUNTER — Other Ambulatory Visit: Payer: Self-pay

## 2020-08-19 ENCOUNTER — Encounter: Payer: Self-pay | Admitting: Surgical

## 2020-08-19 ENCOUNTER — Ambulatory Visit (INDEPENDENT_AMBULATORY_CARE_PROVIDER_SITE_OTHER): Payer: BC Managed Care – PPO | Admitting: Surgical

## 2020-08-19 VITALS — BP 120/81 | HR 64 | Ht 67.0 in | Wt 278.0 lb

## 2020-08-19 DIAGNOSIS — M546 Pain in thoracic spine: Secondary | ICD-10-CM

## 2020-08-19 DIAGNOSIS — M542 Cervicalgia: Secondary | ICD-10-CM

## 2020-08-19 DIAGNOSIS — G8929 Other chronic pain: Secondary | ICD-10-CM

## 2020-08-19 DIAGNOSIS — N62 Hypertrophy of breast: Secondary | ICD-10-CM

## 2020-08-19 MED ORDER — ONDANSETRON HCL 4 MG PO TABS: 4.0000 mg | ORAL_TABLET | Freq: Three times a day (TID) | ORAL | 0 refills | Status: DC | PRN

## 2020-08-19 MED ORDER — CEPHALEXIN 500 MG PO CAPS: 500.0000 mg | ORAL_CAPSULE | Freq: Four times a day (QID) | ORAL | 0 refills | Status: AC

## 2020-08-19 MED ORDER — HYDROCODONE-ACETAMINOPHEN 5-325 MG PO TABS: 1.0000 | ORAL_TABLET | Freq: Four times a day (QID) | ORAL | 0 refills | Status: AC | PRN

## 2020-08-19 NOTE — Progress Notes (Signed)
Patient ID: Kimberly Mckay, female    DOB: 1980-05-30, 40 y.o.   MRN: [id number]  Chief Complaint  Patient presents with   Pre-op Exam     ICD-10-CM   1. Symptomatic mammary hypertrophy  N62     2. Chronic bilateral thoracic back pain  M54.6    G89.29     3. Neck pain  M54.2       History of Present Illness: Kimberly Mckay is a 40 y.o.  female  with a history of macromastia.  She presents for preoperative evaluation for upcoming procedure, Bilateral Breast Reduction with liposuction and excision of accessory axillary tissue bilaterally, scheduled for 09/03/2020 with Dr.  Ulice Bold  The patient has not had problems with anesthesia. No history of DVT/PE.  No family history of DVT/PE.  No family or personal history of bleeding or clotting disorders.  Patient is not currently taking any blood thinners.  No history of CVA/MI.   Summary of Previous Visit: STN on the right is 34.5 cm and the left is 35 cm.  Preoperative bra size is 42 triple D cup.  Planning for excision of axillary tissue on each side.  Expect additional 200-300 g from each axilla.  She quit smoking 2 years ago.  Estimated excess breast tissue to be removed at time of surgery: 850 on the left and 850 on the right.  Job: Desk job  PMH Significant for: Vitamin D deficiency, anxiety, depression, asthma.  Reports asthma has been well controlled.  She does not currently need an inhaler.  No recent hospitalizations.   Past Medical History: Allergies: Allergies  Allergen Reactions   Aspirin Hives   Butorphanol Other (See Comments)    Other reaction(s): Psychosis (intolerance) Hallucinations    Propoxyphene Hives   Sulfa Antibiotics Nausea And Vomiting   Meperidine Rash    Hives      Current Medications:  Current Outpatient Medications:    albuterol (VENTOLIN HFA) 108 (90 Base) MCG/ACT inhaler, Inhale into the lungs., Disp: , Rfl:    ALPRAZolam (XANAX) 0.5 MG tablet, Take 0.5 mg by mouth at bedtime as  needed for anxiety., Disp: , Rfl:    diphenoxylate-atropine (LOMOTIL) 2.5-0.025 MG tablet, Take 1 tablet by mouth 4 (four) times daily as needed for diarrhea or loose stools., Disp: 10 tablet, Rfl: 0   ibuprofen (ADVIL,MOTRIN) 600 MG tablet, Take 600 mg by mouth every 6 (six) hours as needed., Disp: , Rfl:    metFORMIN (GLUCOPHAGE) 500 MG tablet, Take 1 tablet (500 mg total) by mouth 2 (two) times daily with a meal., Disp: 60 tablet, Rfl: 0   multivitamin (VIT W/EXTRA C) CHEW chewable tablet, Chew by mouth., Disp: , Rfl:    Vitamin D, Ergocalciferol, (DRISDOL) 1.25 MG (50000 UNIT) CAPS capsule, Take 1 capsule (50,000 Units total) by mouth every 7 (seven) days., Disp: 4 capsule, Rfl: 0  Past Medical Problems: Past Medical History:  Diagnosis Date   Anemia    Anxiety    Asthma    Back pain    Constipation    COVID-19 04/2019   Depression    DJD (degenerative joint disease)    Eczema    Fibromyalgia    Prediabetes    SOBOE (shortness of breath on exertion)    Swelling of both lower extremities     Past Surgical History: No past surgical history on file.  Social History: Social History   Socioeconomic History   Marital status: Married    Spouse name:  Galen Daft   Number of children: 2   Years of education: Not on file   Highest education level: Not on file  Occupational History   Occupation: Occupational psychologist  Tobacco Use   Smoking status: Former    Packs/day: 0.50    Years: 20.00    Pack years: 10.00    Types: Cigarettes    Quit date: 2019    Years since quitting: 3.4   Smokeless tobacco: Never  Vaping Use   Vaping Use: Never used  Substance and Sexual Activity   Alcohol use: Not Currently   Drug use: No   Sexual activity: Not on file  Other Topics Concern   Not on file  Social History Narrative   Not on file   Social Determinants of Health   Financial Resource Strain: Not on file  Food Insecurity: Not on file  Transportation Needs: Not on  file  Physical Activity: Not on file  Stress: Not on file  Social Connections: Not on file  Intimate Partner Violence: Not on file    Family History: Family History  Problem Relation Age of Onset   Diabetes Mother    Heart disease Mother    Cancer Mother    Thyroid disease Mother    Obesity Mother    Hypertension Father    Hyperlipidemia Father    Alcoholism Father     Review of Systems: Review of Systems  Constitutional: Negative.   Respiratory: Negative.    Cardiovascular: Negative.   Gastrointestinal: Negative.   Neurological: Negative.    Physical Exam: Vital Signs BP 120/81 (BP Location: Left Arm, Patient Position: Sitting, Cuff Size: Large)   Pulse 64   Ht 5\' 7"  (1.702 m)   Wt 278 lb (126.1 kg)   SpO2 98%   BMI 43.54 kg/m   Physical Exam  Constitutional:      General: Not in acute distress.    Appearance: Normal appearance. Not ill-appearing.  HENT:     Head: Normocephalic and atraumatic.  Eyes:     Pupils: Pupils are equal, round Neck:     Musculoskeletal: Normal range of motion.  Cardiovascular:     Rate and Rhythm: Normal rate    Pulses: Normal pulses.  Pulmonary:     Effort: Pulmonary effort is normal. No respiratory distress.  Abdominal:     General: Abdomen is flat. There is no distension.  Musculoskeletal: Normal range of motion.  Skin:    General: Skin is warm and dry.     Findings: No erythema or rash.  Neurological:     General: No focal deficit present.     Mental Status: Alert and oriented to person, place, and time. Mental status is at baseline.     Motor: No weakness.  Psychiatric:        Mood and Affect: Mood normal.        Behavior: Behavior normal.    Assessment/Plan: The patient is scheduled for bilateral breast reduction with Dr. .  Risks, benefits, and alternatives of procedure discussed, questions answered and consent obtained.    Smoking Status: Quit smoking 2 years ago; Counseling Given?  N/A Last  Mammogram: 05/20/2020 with subsequent right breast biopsy; Results: No malignancy identified  Caprini Score: 4, moderate; Risk Factors include: Age, BMI greater than 40, and length of planned surgery. Recommendation for mechanical and pharmacological prophylaxis while hospitalized. Encourage early ambulation.   Pictures obtained:[email or web address]   Post-op Rx sent to pharmacy: Norco, Zofran, Keflex  Patient was provided with  the breast reduction and General Surgical Risk consent document and Pain Medication Agreement prior to their appointment.  They had adequate time to read through the risk consent documents and Pain Medication Agreement. We also discussed them in person together during this preop appointment. All of their questions were answered to their satisfaction.  Recommended calling if they have any further questions.  Risk consent form and Pain Medication Agreement to be scanned into patient's chart.  The risk that can be encountered with breast reduction were discussed and include the following but not limited to these:  Breast asymmetry, fluid accumulation, firmness of the breast, inability to breast feed, loss of nipple or areola, skin loss, decrease or no nipple sensation, fat necrosis of the breast tissue, bleeding, infection, healing delay.  There are risks of anesthesia, changes to skin sensation and injury to nerves or blood vessels.  The muscle can be temporarily or permanently injured.  You may have an allergic reaction to tape, suture, glue, blood products which can result in skin discoloration, swelling, pain, skin lesions, poor healing.  Any of these can lead to the need for revisonal surgery or stage procedures.  A reduction has potential to interfere with diagnostic procedures.  Nipple or breast piercing can increase risks of infection.  This procedure is best done when the breast is fully developed.  Changes in the breast will continue to occur over time.  Pregnancy can alter the outcomes of  previous breast reduction surgery, weight gain and weigh loss can also effect the long term appearance.   The risks that can be encountered with and after liposuction were discussed and include the following but no limited to these:  Asymmetry, fluid accumulation, firmness of the area, fat necrosis with death of fat tissue, bleeding, infection, delayed healing, anesthesia risks, skin sensation changes, injury to structures including nerves, blood vessels, and muscles which may be temporary or permanent, allergies to tape, suture materials and glues, blood products, topical preparations or injected agents, skin and contour irregularities, skin discoloration and swelling, deep vein thrombosis, cardiac and pulmonary complications, pain, which may persist, persistent pain, recurrence of the lesion, poor healing of the incision, possible need for revisional surgery or staged procedures. Thiere can also be persistent swelling, poor wound healing, rippling or loose skin, worsening of cellulite, swelling, and thermal burn or heat injury from ultrasound with the ultrasound-assisted lipoplasty technique. Any change in weight fluctuations can alter the outcome.   Electronically signed by: Kermit Balo Williamson [place], PA-C 08/19/2020 9:14 AM

## 2020-08-19 NOTE — H&P (View-Only) (Signed)
Patient ID: Kimberly Mckay, female    DOB: 1980-05-30, 40 y.o.   MRN: [id number]  Chief Complaint  Patient presents with   Pre-op Exam     ICD-10-CM   1. Symptomatic mammary hypertrophy  N62     2. Chronic bilateral thoracic back pain  M54.6    G89.29     3. Neck pain  M54.2       History of Present Illness: Kimberly Mckay is a 40 y.o.  female  with a history of macromastia.  She presents for preoperative evaluation for upcoming procedure, Bilateral Breast Reduction with liposuction and excision of accessory axillary tissue bilaterally, scheduled for 09/03/2020 with Dr.  Ulice Bold  The patient has not had problems with anesthesia. No history of DVT/PE.  No family history of DVT/PE.  No family or personal history of bleeding or clotting disorders.  Patient is not currently taking any blood thinners.  No history of CVA/MI.   Summary of Previous Visit: STN on the right is 34.5 cm and the left is 35 cm.  Preoperative bra size is 42 triple D cup.  Planning for excision of axillary tissue on each side.  Expect additional 200-300 g from each axilla.  She quit smoking 2 years ago.  Estimated excess breast tissue to be removed at time of surgery: 850 on the left and 850 on the right.  Job: Desk job  PMH Significant for: Vitamin D deficiency, anxiety, depression, asthma.  Reports asthma has been well controlled.  She does not currently need an inhaler.  No recent hospitalizations.   Past Medical History: Allergies: Allergies  Allergen Reactions   Aspirin Hives   Butorphanol Other (See Comments)    Other reaction(s): Psychosis (intolerance) Hallucinations    Propoxyphene Hives   Sulfa Antibiotics Nausea And Vomiting   Meperidine Rash    Hives      Current Medications:  Current Outpatient Medications:    albuterol (VENTOLIN HFA) 108 (90 Base) MCG/ACT inhaler, Inhale into the lungs., Disp: , Rfl:    ALPRAZolam (XANAX) 0.5 MG tablet, Take 0.5 mg by mouth at bedtime as  needed for anxiety., Disp: , Rfl:    diphenoxylate-atropine (LOMOTIL) 2.5-0.025 MG tablet, Take 1 tablet by mouth 4 (four) times daily as needed for diarrhea or loose stools., Disp: 10 tablet, Rfl: 0   ibuprofen (ADVIL,MOTRIN) 600 MG tablet, Take 600 mg by mouth every 6 (six) hours as needed., Disp: , Rfl:    metFORMIN (GLUCOPHAGE) 500 MG tablet, Take 1 tablet (500 mg total) by mouth 2 (two) times daily with a meal., Disp: 60 tablet, Rfl: 0   multivitamin (VIT W/EXTRA C) CHEW chewable tablet, Chew by mouth., Disp: , Rfl:    Vitamin D, Ergocalciferol, (DRISDOL) 1.25 MG (50000 UNIT) CAPS capsule, Take 1 capsule (50,000 Units total) by mouth every 7 (seven) days., Disp: 4 capsule, Rfl: 0  Past Medical Problems: Past Medical History:  Diagnosis Date   Anemia    Anxiety    Asthma    Back pain    Constipation    COVID-19 04/2019   Depression    DJD (degenerative joint disease)    Eczema    Fibromyalgia    Prediabetes    SOBOE (shortness of breath on exertion)    Swelling of both lower extremities     Past Surgical History: No past surgical history on file.  Social History: Social History   Socioeconomic History   Marital status: Married    Spouse name:  Galen Daft   Number of children: 2   Years of education: Not on file   Highest education level: Not on file  Occupational History   Occupation: Occupational psychologist  Tobacco Use   Smoking status: Former    Packs/day: 0.50    Years: 20.00    Pack years: 10.00    Types: Cigarettes    Quit date: 2019    Years since quitting: 3.4   Smokeless tobacco: Never  Vaping Use   Vaping Use: Never used  Substance and Sexual Activity   Alcohol use: Not Currently   Drug use: No   Sexual activity: Not on file  Other Topics Concern   Not on file  Social History Narrative   Not on file   Social Determinants of Health   Financial Resource Strain: Not on file  Food Insecurity: Not on file  Transportation Needs: Not on  file  Physical Activity: Not on file  Stress: Not on file  Social Connections: Not on file  Intimate Partner Violence: Not on file    Family History: Family History  Problem Relation Age of Onset   Diabetes Mother    Heart disease Mother    Cancer Mother    Thyroid disease Mother    Obesity Mother    Hypertension Father    Hyperlipidemia Father    Alcoholism Father     Review of Systems: Review of Systems  Constitutional: Negative.   Respiratory: Negative.    Cardiovascular: Negative.   Gastrointestinal: Negative.   Neurological: Negative.    Physical Exam: Vital Signs BP 120/81 (BP Location: Left Arm, Patient Position: Sitting, Cuff Size: Large)   Pulse 64   Ht 5\' 7"  (1.702 m)   Wt 278 lb (126.1 kg)   SpO2 98%   BMI 43.54 kg/m   Physical Exam  Constitutional:      General: Not in acute distress.    Appearance: Normal appearance. Not ill-appearing.  HENT:     Head: Normocephalic and atraumatic.  Eyes:     Pupils: Pupils are equal, round Neck:     Musculoskeletal: Normal range of motion.  Cardiovascular:     Rate and Rhythm: Normal rate    Pulses: Normal pulses.  Pulmonary:     Effort: Pulmonary effort is normal. No respiratory distress.  Abdominal:     General: Abdomen is flat. There is no distension.  Musculoskeletal: Normal range of motion.  Skin:    General: Skin is warm and dry.     Findings: No erythema or rash.  Neurological:     General: No focal deficit present.     Mental Status: Alert and oriented to person, place, and time. Mental status is at baseline.     Motor: No weakness.  Psychiatric:        Mood and Affect: Mood normal.        Behavior: Behavior normal.    Assessment/Plan: The patient is scheduled for bilateral breast reduction with Dr. .  Risks, benefits, and alternatives of procedure discussed, questions answered and consent obtained.    Smoking Status: Quit smoking 2 years ago; Counseling Given?  N/A Last  Mammogram: 05/20/2020 with subsequent right breast biopsy; Results: No malignancy identified  Caprini Score: 4, moderate; Risk Factors include: Age, BMI greater than 40, and length of planned surgery. Recommendation for mechanical and pharmacological prophylaxis while hospitalized. Encourage early ambulation.   Pictures obtained:@[email or web address]   Post-op Rx sent to pharmacy: Norco, Zofran, Keflex  Patient was provided with  the breast reduction and General Surgical Risk consent document and Pain Medication Agreement prior to their appointment.  They had adequate time to read through the risk consent documents and Pain Medication Agreement. We also discussed them in person together during this preop appointment. All of their questions were answered to their satisfaction.  Recommended calling if they have any further questions.  Risk consent form and Pain Medication Agreement to be scanned into patient's chart.  The risk that can be encountered with breast reduction were discussed and include the following but not limited to these:  Breast asymmetry, fluid accumulation, firmness of the breast, inability to breast feed, loss of nipple or areola, skin loss, decrease or no nipple sensation, fat necrosis of the breast tissue, bleeding, infection, healing delay.  There are risks of anesthesia, changes to skin sensation and injury to nerves or blood vessels.  The muscle can be temporarily or permanently injured.  You may have an allergic reaction to tape, suture, glue, blood products which can result in skin discoloration, swelling, pain, skin lesions, poor healing.  Any of these can lead to the need for revisonal surgery or stage procedures.  A reduction has potential to interfere with diagnostic procedures.  Nipple or breast piercing can increase risks of infection.  This procedure is best done when the breast is fully developed.  Changes in the breast will continue to occur over time.  Pregnancy can alter the outcomes of  previous breast reduction surgery, weight gain and weigh loss can also effect the long term appearance.   The risks that can be encountered with and after liposuction were discussed and include the following but no limited to these:  Asymmetry, fluid accumulation, firmness of the area, fat necrosis with death of fat tissue, bleeding, infection, delayed healing, anesthesia risks, skin sensation changes, injury to structures including nerves, blood vessels, and muscles which may be temporary or permanent, allergies to tape, suture materials and glues, blood products, topical preparations or injected agents, skin and contour irregularities, skin discoloration and swelling, deep vein thrombosis, cardiac and pulmonary complications, pain, which may persist, persistent pain, recurrence of the lesion, poor healing of the incision, possible need for revisional surgery or staged procedures. Thiere can also be persistent swelling, poor wound healing, rippling or loose skin, worsening of cellulite, swelling, and thermal burn or heat injury from ultrasound with the ultrasound-assisted lipoplasty technique. Any change in weight fluctuations can alter the outcome.   Electronically signed by: Kermit Balo Thaddeus Evitts, PA-C 08/19/2020 9:14 AM

## 2020-08-21 ENCOUNTER — Encounter (INDEPENDENT_AMBULATORY_CARE_PROVIDER_SITE_OTHER): Payer: Self-pay

## 2020-08-21 ENCOUNTER — Ambulatory Visit (INDEPENDENT_AMBULATORY_CARE_PROVIDER_SITE_OTHER): Payer: BC Managed Care – PPO | Admitting: Adult Health

## 2020-08-27 ENCOUNTER — Encounter (HOSPITAL_BASED_OUTPATIENT_CLINIC_OR_DEPARTMENT_OTHER): Payer: Self-pay | Admitting: Plastic Surgery

## 2020-08-27 ENCOUNTER — Other Ambulatory Visit: Payer: Self-pay

## 2020-09-01 ENCOUNTER — Other Ambulatory Visit (HOSPITAL_COMMUNITY): Payer: BC Managed Care – PPO

## 2020-09-03 ENCOUNTER — Ambulatory Visit (HOSPITAL_BASED_OUTPATIENT_CLINIC_OR_DEPARTMENT_OTHER): Payer: BC Managed Care – PPO | Admitting: Anesthesiology

## 2020-09-03 ENCOUNTER — Other Ambulatory Visit: Payer: Self-pay

## 2020-09-03 ENCOUNTER — Encounter: Payer: Self-pay | Admitting: Plastic Surgery

## 2020-09-03 ENCOUNTER — Encounter (HOSPITAL_BASED_OUTPATIENT_CLINIC_OR_DEPARTMENT_OTHER): Payer: Self-pay | Admitting: Plastic Surgery

## 2020-09-03 ENCOUNTER — Encounter (HOSPITAL_BASED_OUTPATIENT_CLINIC_OR_DEPARTMENT_OTHER)
Admission: RE | Disposition: A | Discharge status: Home / Self Care | Payer: Self-pay | Source: Home / Self Care | Attending: Plastic Surgery

## 2020-09-03 ENCOUNTER — Ambulatory Visit (HOSPITAL_BASED_OUTPATIENT_CLINIC_OR_DEPARTMENT_OTHER)
Admission: RE | Admit: 2020-09-03 | Discharge: 2020-09-03 | Disposition: A | Discharge status: Home / Self Care | Payer: BC Managed Care – PPO | Attending: Plastic Surgery | Admitting: Plastic Surgery

## 2020-09-03 DIAGNOSIS — F32A Depression, unspecified: Secondary | ICD-10-CM | POA: Insufficient documentation

## 2020-09-03 DIAGNOSIS — Z882 Allergy status to sulfonamides status: Secondary | ICD-10-CM | POA: Diagnosis not present

## 2020-09-03 DIAGNOSIS — Z7984 Long term (current) use of oral hypoglycemic drugs: Secondary | ICD-10-CM | POA: Insufficient documentation

## 2020-09-03 DIAGNOSIS — Z79899 Other long term (current) drug therapy: Secondary | ICD-10-CM | POA: Diagnosis not present

## 2020-09-03 DIAGNOSIS — M797 Fibromyalgia: Secondary | ICD-10-CM | POA: Diagnosis not present

## 2020-09-03 DIAGNOSIS — E559 Vitamin D deficiency, unspecified: Secondary | ICD-10-CM | POA: Insufficient documentation

## 2020-09-03 DIAGNOSIS — N62 Hypertrophy of breast: Secondary | ICD-10-CM | POA: Insufficient documentation

## 2020-09-03 DIAGNOSIS — Z8616 Personal history of COVID-19: Secondary | ICD-10-CM | POA: Diagnosis not present

## 2020-09-03 DIAGNOSIS — Z886 Allergy status to analgesic agent status: Secondary | ICD-10-CM | POA: Insufficient documentation

## 2020-09-03 DIAGNOSIS — Z885 Allergy status to narcotic agent status: Secondary | ICD-10-CM | POA: Insufficient documentation

## 2020-09-03 DIAGNOSIS — N6081 Other benign mammary dysplasias of right breast: Secondary | ICD-10-CM | POA: Diagnosis not present

## 2020-09-03 DIAGNOSIS — Z87891 Personal history of nicotine dependence: Secondary | ICD-10-CM | POA: Diagnosis not present

## 2020-09-03 DIAGNOSIS — F419 Anxiety disorder, unspecified: Secondary | ICD-10-CM | POA: Insufficient documentation

## 2020-09-03 DIAGNOSIS — J45909 Unspecified asthma, uncomplicated: Secondary | ICD-10-CM | POA: Insufficient documentation

## 2020-09-03 DIAGNOSIS — N6012 Diffuse cystic mastopathy of left breast: Secondary | ICD-10-CM | POA: Diagnosis not present

## 2020-09-03 DIAGNOSIS — F418 Other specified anxiety disorders: Secondary | ICD-10-CM | POA: Diagnosis not present

## 2020-09-03 DIAGNOSIS — M542 Cervicalgia: Secondary | ICD-10-CM | POA: Diagnosis not present

## 2020-09-03 DIAGNOSIS — M546 Pain in thoracic spine: Secondary | ICD-10-CM | POA: Diagnosis not present

## 2020-09-03 DIAGNOSIS — G8929 Other chronic pain: Secondary | ICD-10-CM | POA: Diagnosis not present

## 2020-09-03 HISTORY — PX: BREAST REDUCTION SURGERY: SHX8

## 2020-09-03 HISTORY — DX: Other complications of anesthesia, initial encounter: T88.59XA

## 2020-09-03 LAB — POCT PREGNANCY, URINE: Preg Test, Ur: NEGATIVE

## 2020-09-03 SURGERY — BREAST REDUCTION WITH LIPOSUCTION
Anesthesia: General | Site: Breast | Laterality: Bilateral

## 2020-09-03 MED ORDER — BUPIVACAINE HCL (PF) 0.25 % IJ SOLN
INTRAMUSCULAR | Status: AC
  Filled 2020-09-03: qty 30

## 2020-09-03 MED ORDER — LIDOCAINE-EPINEPHRINE 1 %-1:100000 IJ SOLN
INTRAMUSCULAR | Status: DC | PRN
  Administered 2020-09-03: 45 mL

## 2020-09-03 MED ORDER — FENTANYL CITRATE (PF) 100 MCG/2ML IJ SOLN
INTRAMUSCULAR | Status: AC
  Filled 2020-09-03: qty 2

## 2020-09-03 MED ORDER — FENTANYL CITRATE (PF) 100 MCG/2ML IJ SOLN
INTRAMUSCULAR | Status: DC | PRN
  Administered 2020-09-03 (×7): 50 ug via INTRAVENOUS

## 2020-09-03 MED ORDER — CEFAZOLIN IN SODIUM CHLORIDE 3-0.9 GM/100ML-% IV SOLN
INTRAVENOUS | Status: AC
  Filled 2020-09-03: qty 100

## 2020-09-03 MED ORDER — HYDROMORPHONE HCL 1 MG/ML IJ SOLN
INTRAMUSCULAR | Status: AC
  Filled 2020-09-03: qty 0.5

## 2020-09-03 MED ORDER — MIDAZOLAM HCL 5 MG/5ML IJ SOLN
INTRAMUSCULAR | Status: DC | PRN
  Administered 2020-09-03: 2 mg via INTRAVENOUS

## 2020-09-03 MED ORDER — AMISULPRIDE (ANTIEMETIC) 5 MG/2ML IV SOLN
INTRAVENOUS | Status: AC
  Filled 2020-09-03: qty 4

## 2020-09-03 MED ORDER — PROPOFOL 500 MG/50ML IV EMUL
INTRAVENOUS | Status: AC
  Filled 2020-09-03: qty 50

## 2020-09-03 MED ORDER — ROCURONIUM BROMIDE 10 MG/ML (PF) SYRINGE
PREFILLED_SYRINGE | INTRAVENOUS | Status: AC
  Filled 2020-09-03: qty 10

## 2020-09-03 MED ORDER — PROPOFOL 10 MG/ML IV BOLUS
INTRAVENOUS | Status: AC
  Filled 2020-09-03: qty 20

## 2020-09-03 MED ORDER — LIDOCAINE HCL (CARDIAC) PF 100 MG/5ML IV SOSY
PREFILLED_SYRINGE | INTRAVENOUS | Status: DC | PRN
  Administered 2020-09-03: 80 mg via INTRAVENOUS

## 2020-09-03 MED ORDER — HYDROMORPHONE HCL 1 MG/ML IJ SOLN
0.2500 mg | INTRAMUSCULAR | Status: DC | PRN
  Administered 2020-09-03: 0.5 mg via INTRAVENOUS

## 2020-09-03 MED ORDER — PROPOFOL 500 MG/50ML IV EMUL
INTRAVENOUS | Status: DC | PRN
  Administered 2020-09-03: 25 ug/kg/min via INTRAVENOUS

## 2020-09-03 MED ORDER — DEXMEDETOMIDINE (PRECEDEX) IN NS 20 MCG/5ML (4 MCG/ML) IV SYRINGE
PREFILLED_SYRINGE | INTRAVENOUS | Status: DC | PRN
  Administered 2020-09-03 (×2): 8 ug via INTRAVENOUS

## 2020-09-03 MED ORDER — SUGAMMADEX SODIUM 500 MG/5ML IV SOLN
INTRAVENOUS | Status: AC
  Filled 2020-09-03: qty 5

## 2020-09-03 MED ORDER — PROMETHAZINE HCL 25 MG/ML IJ SOLN
6.2500 mg | INTRAMUSCULAR | Status: DC | PRN
Start: 2020-09-03 — End: 2020-09-03
  Administered 2020-09-03: 6.25 mg via INTRAVENOUS

## 2020-09-03 MED ORDER — LIDOCAINE HCL 1 % IJ SOLN
INTRAVENOUS | Status: DC | PRN
  Administered 2020-09-03: 1000 mL

## 2020-09-03 MED ORDER — DEXAMETHASONE SODIUM PHOSPHATE 4 MG/ML IJ SOLN
INTRAMUSCULAR | Status: DC | PRN
  Administered 2020-09-03: 10 mg via INTRAVENOUS

## 2020-09-03 MED ORDER — NITROGLYCERIN 2 % TD OINT
TOPICAL_OINTMENT | TRANSDERMAL | Status: DC | PRN
  Administered 2020-09-03: 1 [in_us] via TOPICAL

## 2020-09-03 MED ORDER — DEXMEDETOMIDINE (PRECEDEX) IN NS 20 MCG/5ML (4 MCG/ML) IV SYRINGE
PREFILLED_SYRINGE | INTRAVENOUS | Status: AC
  Filled 2020-09-03: qty 5

## 2020-09-03 MED ORDER — OXYCODONE HCL 5 MG PO TABS
ORAL_TABLET | ORAL | Status: AC
  Filled 2020-09-03: qty 1

## 2020-09-03 MED ORDER — CHLORHEXIDINE GLUCONATE CLOTH 2 % EX PADS: 6.0000 | MEDICATED_PAD | Freq: Once | CUTANEOUS | Status: DC

## 2020-09-03 MED ORDER — PROPOFOL 10 MG/ML IV BOLUS
INTRAVENOUS | Status: DC | PRN
  Administered 2020-09-03: 200 mg via INTRAVENOUS

## 2020-09-03 MED ORDER — PHENYLEPHRINE HCL (PRESSORS) 10 MG/ML IV SOLN
INTRAVENOUS | Status: DC | PRN
  Administered 2020-09-03: 40 ug via INTRAVENOUS

## 2020-09-03 MED ORDER — LIDOCAINE HCL (PF) 1 % IJ SOLN
INTRAMUSCULAR | Status: AC
  Filled 2020-09-03: qty 60

## 2020-09-03 MED ORDER — AMISULPRIDE (ANTIEMETIC) 5 MG/2ML IV SOLN
10.0000 mg | Freq: Once | INTRAVENOUS | Status: AC | PRN
  Administered 2020-09-03: 10 mg via INTRAVENOUS

## 2020-09-03 MED ORDER — OXYCODONE HCL 5 MG/5ML PO SOLN: 5.0000 mg | Freq: Once | ORAL | Status: AC | PRN

## 2020-09-03 MED ORDER — LIDOCAINE HCL (PF) 2 % IJ SOLN
INTRAMUSCULAR | Status: AC
  Filled 2020-09-03: qty 5

## 2020-09-03 MED ORDER — NITROGLYCERIN 2 % TD OINT
TOPICAL_OINTMENT | TRANSDERMAL | Status: AC
  Filled 2020-09-03: qty 30

## 2020-09-03 MED ORDER — PHENYLEPHRINE 40 MCG/ML (10ML) SYRINGE FOR IV PUSH (FOR BLOOD PRESSURE SUPPORT)
PREFILLED_SYRINGE | INTRAVENOUS | Status: AC
  Filled 2020-09-03: qty 10

## 2020-09-03 MED ORDER — PROMETHAZINE HCL 25 MG/ML IJ SOLN
INTRAMUSCULAR | Status: AC
  Filled 2020-09-03: qty 1

## 2020-09-03 MED ORDER — ONDANSETRON HCL 4 MG/2ML IJ SOLN
INTRAMUSCULAR | Status: DC | PRN
  Administered 2020-09-03: 4 mg via INTRAVENOUS

## 2020-09-03 MED ORDER — ONDANSETRON HCL 4 MG/2ML IJ SOLN
INTRAMUSCULAR | Status: AC
  Filled 2020-09-03: qty 2

## 2020-09-03 MED ORDER — LIDOCAINE-EPINEPHRINE 1 %-1:100000 IJ SOLN
INTRAMUSCULAR | Status: AC
  Filled 2020-09-03: qty 2

## 2020-09-03 MED ORDER — CEFAZOLIN SODIUM-DEXTROSE 2-4 GM/100ML-% IV SOLN: 2.0000 g | INTRAVENOUS | Status: DC

## 2020-09-03 MED ORDER — MIDAZOLAM HCL 2 MG/2ML IJ SOLN
INTRAMUSCULAR | Status: AC
  Filled 2020-09-03: qty 2

## 2020-09-03 MED ORDER — LACTATED RINGERS IV SOLN: INTRAVENOUS | Status: DC

## 2020-09-03 MED ORDER — ROCURONIUM BROMIDE 100 MG/10ML IV SOLN
INTRAVENOUS | Status: DC | PRN
  Administered 2020-09-03 (×3): 20 mg via INTRAVENOUS
  Administered 2020-09-03: 60 mg via INTRAVENOUS
  Administered 2020-09-03: 20 mg via INTRAVENOUS

## 2020-09-03 MED ORDER — EPINEPHRINE PF 1 MG/ML IJ SOLN
INTRAMUSCULAR | Status: AC
  Filled 2020-09-03: qty 1

## 2020-09-03 MED ORDER — OXYCODONE HCL 5 MG PO TABS
5.0000 mg | ORAL_TABLET | Freq: Once | ORAL | Status: AC | PRN
  Administered 2020-09-03: 5 mg via ORAL

## 2020-09-03 MED ORDER — SUGAMMADEX SODIUM 500 MG/5ML IV SOLN
INTRAVENOUS | Status: DC | PRN
  Administered 2020-09-03: 250 mg via INTRAVENOUS

## 2020-09-03 MED ORDER — DEXAMETHASONE SODIUM PHOSPHATE 10 MG/ML IJ SOLN
INTRAMUSCULAR | Status: AC
  Filled 2020-09-03: qty 1

## 2020-09-03 MED ORDER — DEXTROSE 5 % IV SOLN
INTRAVENOUS | Status: DC | PRN
  Administered 2020-09-03: 3 g via INTRAVENOUS

## 2020-09-03 SURGICAL SUPPLY — 67 items
ADH SKN CLS APL DERMABOND .7 (GAUZE/BANDAGES/DRESSINGS) ×4
BAG DECANTER FOR FLEXI CONT (MISCELLANEOUS) IMPLANT
BINDER BREAST 3XL (GAUZE/BANDAGES/DRESSINGS) ×2 IMPLANT
BINDER BREAST LRG (GAUZE/BANDAGES/DRESSINGS) IMPLANT
BINDER BREAST MEDIUM (GAUZE/BANDAGES/DRESSINGS) IMPLANT
BINDER BREAST XLRG (GAUZE/BANDAGES/DRESSINGS) IMPLANT
BINDER BREAST XXLRG (GAUZE/BANDAGES/DRESSINGS) IMPLANT
BIOPATCH RED 1 DISK 7.0 (GAUZE/BANDAGES/DRESSINGS) ×4 IMPLANT
BLADE HEX COATED 2.75 (ELECTRODE) IMPLANT
BLADE KNIFE PERSONA 10 (BLADE) ×4 IMPLANT
BLADE SURG 15 STRL LF DISP TIS (BLADE) ×1 IMPLANT
BLADE SURG 15 STRL SS (BLADE) ×2
CANISTER SUCT 1200ML W/VALVE (MISCELLANEOUS) ×2 IMPLANT
COVER BACK TABLE 60X90IN (DRAPES) ×2 IMPLANT
COVER MAYO STAND STRL (DRAPES) ×2 IMPLANT
DECANTER SPIKE VIAL GLASS SM (MISCELLANEOUS) IMPLANT
DERMABOND ADVANCED (GAUZE/BANDAGES/DRESSINGS) ×4
DERMABOND ADVANCED .7 DNX12 (GAUZE/BANDAGES/DRESSINGS) ×4 IMPLANT
DRAIN CHANNEL 15F RND FF W/TCR (WOUND CARE) ×4 IMPLANT
DRAIN CHANNEL 19F RND (DRAIN) IMPLANT
DRAPE LAPAROSCOPIC ABDOMINAL (DRAPES) ×2 IMPLANT
DRSG OPSITE POSTOP 4X6 (GAUZE/BANDAGES/DRESSINGS) ×4 IMPLANT
DRSG PAD ABDOMINAL 8X10 ST (GAUZE/BANDAGES/DRESSINGS) ×8 IMPLANT
DRSG TEGADERM 2-3/8X2-3/4 SM (GAUZE/BANDAGES/DRESSINGS) ×4 IMPLANT
ELECT BLADE 4.0 EZ CLEAN MEGAD (MISCELLANEOUS) ×2
ELECT REM PT RETURN 9FT ADLT (ELECTROSURGICAL) ×2
ELECTRODE BLDE 4.0 EZ CLN MEGD (MISCELLANEOUS) ×1 IMPLANT
ELECTRODE REM PT RTRN 9FT ADLT (ELECTROSURGICAL) ×1 IMPLANT
EVACUATOR SILICONE 100CC (DRAIN) ×4 IMPLANT
GLOVE SURG ENC MOIS LTX SZ6.5 (GLOVE) ×6 IMPLANT
GLOVE SURG ENC MOIS LTX SZ7.5 (GLOVE) ×4 IMPLANT
GLOVE SURG POLYISO LF SZ6.5 (GLOVE) ×2 IMPLANT
GLOVE SURG POLYISO LF SZ8 (GLOVE) ×4 IMPLANT
GLOVE SURG UNDER POLY LF SZ7 (GLOVE) ×6 IMPLANT
GOWN STRL REUS W/ TWL LRG LVL3 (GOWN DISPOSABLE) ×3 IMPLANT
GOWN STRL REUS W/TWL 2XL LVL3 (GOWN DISPOSABLE) ×4 IMPLANT
GOWN STRL REUS W/TWL LRG LVL3 (GOWN DISPOSABLE) ×6
NEEDLE FILTER BLUNT 18X 1/2SAF (NEEDLE) ×1
NEEDLE FILTER BLUNT 18X1 1/2 (NEEDLE) ×1 IMPLANT
NEEDLE HYPO 25X1 1.5 SAFETY (NEEDLE) ×2 IMPLANT
NS IRRIG 1000ML POUR BTL (IV SOLUTION) ×2 IMPLANT
PACK BASIN DAY SURGERY FS (CUSTOM PROCEDURE TRAY) ×2 IMPLANT
PAD ALCOHOL SWAB (MISCELLANEOUS) IMPLANT
PAD FOAM SILICONE BACKED (GAUZE/BANDAGES/DRESSINGS) ×4 IMPLANT
PENCIL SMOKE EVACUATOR (MISCELLANEOUS) ×2 IMPLANT
PIN SAFETY STERILE (MISCELLANEOUS) IMPLANT
SLEEVE SCD COMPRESS KNEE MED (STOCKING) ×2 IMPLANT
SPONGE T-LAP 18X18 ~~LOC~~+RFID (SPONGE) ×8 IMPLANT
STRIP SUTURE WOUND CLOSURE 1/2 (MISCELLANEOUS) ×8 IMPLANT
SUT MNCRL AB 4-0 PS2 18 (SUTURE) ×18 IMPLANT
SUT MON AB 3-0 SH 27 (SUTURE) ×14
SUT MON AB 3-0 SH27 (SUTURE) ×7 IMPLANT
SUT MON AB 5-0 PS2 18 (SUTURE) ×4 IMPLANT
SUT PDS 3-0 CT2 (SUTURE) ×12
SUT PDS II 3-0 CT2 27 ABS (SUTURE) ×6 IMPLANT
SUT SILK 3 0 PS 1 (SUTURE) ×4 IMPLANT
SYR 50ML LL SCALE MARK (SYRINGE) ×2 IMPLANT
SYR BULB IRRIG 60ML STRL (SYRINGE) ×2 IMPLANT
SYR CONTROL 10ML LL (SYRINGE) ×2 IMPLANT
TAPE MEASURE VINYL STERILE (MISCELLANEOUS) IMPLANT
TOWEL GREEN STERILE FF (TOWEL DISPOSABLE) ×4 IMPLANT
TRAY DSU PREP LF (CUSTOM PROCEDURE TRAY) ×2 IMPLANT
TUBE CONNECTING 20X1/4 (TUBING) ×2 IMPLANT
TUBING INFILTRATION IT-10001 (TUBING) ×2 IMPLANT
TUBING SET GRADUATE ASPIR 12FT (MISCELLANEOUS) ×2 IMPLANT
UNDERPAD 30X36 HEAVY ABSORB (UNDERPADS AND DIAPERS) ×4 IMPLANT
YANKAUER SUCT BULB TIP NO VENT (SUCTIONS) ×2 IMPLANT

## 2020-09-03 NOTE — Interval H&P Note (Signed)
History and Physical Interval Note:  09/03/2020 8:37 AM  Kimberly Mckay  has presented today for surgery, with the diagnosis of mammary hypertrophy.  The various methods of treatment have been discussed with the patient and family. After consideration of risks, benefits and other options for treatment, the patient has consented to  Procedure(s) with comments: BREAST REDUCTION WITH LIPOSUCTION (Bilateral) - 4 hours total Excision of accessory axillary tissue (Bilateral) as a surgical intervention.  The patient's history has been reviewed, patient examined, no change in status, stable for surgery.  I have reviewed the patient's chart and labs.  Questions were answered to the patient's satisfaction.     Kadeisha Betsch S Jadier Rockers   

## 2020-09-03 NOTE — Anesthesia Preprocedure Evaluation (Addendum)
Anesthesia Evaluation  Patient identified by MRN, date of birth, ID band Patient awake    Reviewed: Allergy & Precautions, NPO status , Patient's Chart, lab work & pertinent test results  Airway Mallampati: II  TM Distance: >3 FB Neck ROM: Full    Dental no notable dental hx.    Pulmonary asthma , former smoker,    Pulmonary exam normal breath sounds clear to auscultation       Cardiovascular negative cardio ROS Normal cardiovascular exam Rhythm:Regular Rate:Normal     Neuro/Psych Anxiety Depression negative neurological ROS  negative psych ROS   GI/Hepatic negative GI ROS, Neg liver ROS,   Endo/Other  Morbid obesity  Renal/GU negative Renal ROS  negative genitourinary   Musculoskeletal  (+) Arthritis , Osteoarthritis,  Fibromyalgia -  Abdominal (+) + obese,   Peds negative pediatric ROS (+)  Hematology negative hematology ROS (+)   Anesthesia Other Findings   Reproductive/Obstetrics negative OB ROS                             Anesthesia Physical Anesthesia Plan  ASA: 3  Anesthesia Plan: General   Post-op Pain Management:    Induction: Intravenous  PONV Risk Score and Plan: 3 and Ondansetron, Dexamethasone, Midazolam and Treatment may vary due to age or medical condition  Airway Management Planned: Oral ETT  Additional Equipment:   Intra-op Plan:   Post-operative Plan: Extubation in OR  Informed Consent: I have reviewed the patients History and Physical, chart, labs and discussed the procedure including the risks, benefits and alternatives for the proposed anesthesia with the patient or authorized representative who has indicated his/her understanding and acceptance.     Dental advisory given  Plan Discussed with: CRNA  Anesthesia Plan Comments:         Anesthesia Quick Evaluation

## 2020-09-03 NOTE — Anesthesia Postprocedure Evaluation (Signed)
Anesthesia Post Note  Patient: Kimberly Mckay  Procedure(s) Performed: BILATERAL BREAST REDUCTION WITH LIPOSUCTION (Bilateral: Breast)     Patient location during evaluation: PACU Anesthesia Type: General Level of consciousness: awake and alert Pain management: pain level controlled Vital Signs Assessment: post-procedure vital signs reviewed and stable Respiratory status: spontaneous breathing, nonlabored ventilation and respiratory function stable Cardiovascular status: blood pressure returned to baseline and stable Postop Assessment: no apparent nausea or vomiting Anesthetic complications: no   No notable events documented.  Last Vitals:  Vitals:   09/03/20 1230 09/03/20 1245  BP: 130/87 98/62  Pulse: 100 82  Resp: 13 15  Temp:    SpO2: 96% 98%    Last Pain:  Vitals:   09/03/20 1315  TempSrc:   PainSc: 0-No pain                 Lowella Curb

## 2020-09-03 NOTE — Op Note (Signed)
Breast Reduction Op note:    DATE OF PROCEDURE: 09/03/2020  LOCATION: [facility]  SURGEON: Alan Ripper Sanger Demetrius Barrell, DO  ASSISTANT: Keenan Bachelor, PA  PREOPERATIVE DIAGNOSIS 1. Macromastia 2. Neck Pain 3. Back Pain  POSTOPERATIVE DIAGNOSIS 1. Macromastia 2. Neck Pain 3. Back Pain  PROCEDURES 1. Bilateral breast reduction.  Right reduction 1138 g, Left reduction 1363 g  COMPLICATIONS: None.  DRAINS: bilateral #15  INDICATIONS FOR PROCEDURE Kimberly Mckay is a 40 y.o. year-old female born on 1980-06-09,with a history of symptomatic macromastia with concominant back pain, neck pain, shoulder grooving from her bra.   MRN: [id number]  CONSENT Informed consent was obtained directly from the patient. The risks, benefits and alternatives were fully discussed. Specific risks including but not limited to bleeding, infection, hematoma, seroma, scarring, pain, nipple necrosis, asymmetry, poor cosmetic results, and need for further surgery were discussed. The patient had ample opportunity to have her questions answered to her satisfaction.  DESCRIPTION OF PROCEDURE  Patient was brought into the operating room and placed in a supine position.  SCDs were placed and appropriate padding was performed.  Antibiotics were given. The patient underwent general anesthesia and the chest was prepped and draped in a sterile fashion.  A timeout was performed and all information was confirmed to be correct. Tumescent was placed in the axillary excess tissue and lateral breast on each side.  Right side: Preoperative markings were confirmed.  Incision lines were injected with local with epinephrine.  After waiting for vasoconstriction, the marked lines were incised.  Liposuction was done in the axillary area and lateral breast tissue. A Wise-pattern superomedial breast reduction was performed by de-epithelializing the pedicle, using bovie to create the superomedial pedicle, and  removing breast tissue from the lateral and inferior portions of the breast.  Care was taken to not undermine the breast pedicle. Hemostasis was achieved.  The nipple was gently rotated into position and the soft tissue closed with 4-0 Monocryl.   The pocket was irrigated and hemostasis confirmed.  The deep tissues were approximated with 3-0 PDS and Monocryl sutures and the skin was closed with deep dermal and subcuticular 4-0 Monocryl sutures.  The nipple and skin flaps had good capillary refill at the end of the procedure.    Left side: Preoperative markings were confirmed.  Incision lines were injected with local with epinephrine.  After waiting for vasoconstriction, the marked lines were incised.  Liposuction was done in the axillary area and lateral breast tissue. A Wise-pattern superomedial breast reduction was performed by de-epithelializing the pedicle, using bovie to create the superomedial pedicle, and removing breast tissue from the lateral and inferior portions of the breast.  Care was taken to not undermine the breast pedicle. Hemostasis was achieved.  The nipple was gently rotated into position and the soft tissue was closed with 4-0 Monocryl.  The patient was sat upright and size and shape symmetry was confirmed.  The pocket was irrigated and hemostasis confirmed.  The deep tissues were approximated with 3-0 PDS and Monocryl sutures and the skin was closed with deep dermal and subcuticular 4-0 Monocryl sutures.  Dermabond was applied.  A breast binder and ABDs were placed.  The nipple and skin flaps had good capillary refill at the end of the procedure.  The patient tolerated the procedure well. The patient was allowed to wake from anesthesia and taken to the recovery room in satisfactory condition.  The advanced practice practitioner (APP) assisted throughout the case.  The APP  was essential in retraction and counter traction when needed to make the case progress smoothly.  This retraction and  assistance made it possible to see the tissue plans for the procedure.  The assistance was needed for blood control, tissue re-approximation and assisted with closure of the incision site.

## 2020-09-03 NOTE — Transfer of Care (Signed)
Immediate Anesthesia Transfer of Care Note  Patient: Nelissa Bolduc  Procedure(s) Performed: BILATERAL BREAST REDUCTION WITH LIPOSUCTION (Bilateral: Breast)  Patient Location: PACU  Anesthesia Type:General  Level of Consciousness: drowsy, patient cooperative and responds to stimulation  Airway & Oxygen Therapy: Patient Spontanous Breathing and Patient connected to face mask oxygen  Post-op Assessment: Report given to RN and Post -op Vital signs reviewed and stable  Post vital signs: Reviewed and stable  Last Vitals:  Vitals Value Taken Time  BP    Temp    Pulse 78 09/03/20 1215  Resp 14 09/03/20 1215  SpO2 99 % 09/03/20 1215  Vitals shown include unvalidated device data.  Last Pain:  Vitals:   09/03/20 0728  TempSrc: Oral  PainSc: 0-No pain         Complications: No notable events documented.

## 2020-09-03 NOTE — Interval H&P Note (Signed)
History and Physical Interval Note:  09/03/2020 8:37 AM  Kimberly Mckay  has presented today for surgery, with the diagnosis of mammary hypertrophy.  The various methods of treatment have been discussed with the patient and family. After consideration of risks, benefits and other options for treatment, the patient has consented to  Procedure(s) with comments: BREAST REDUCTION WITH LIPOSUCTION (Bilateral) - 4 hours total Excision of accessory axillary tissue (Bilateral) as a surgical intervention.  The patient's history has been reviewed, patient examined, no change in status, stable for surgery.  I have reviewed the patient's chart and labs.  Questions were answered to the patient's satisfaction.     [name] [name]

## 2020-09-03 NOTE — Anesthesia Procedure Notes (Signed)
Procedure Name: Intubation Date/Time: 09/03/2020 8:55 AM Performed by: Thornell Mule, CRNA Pre-anesthesia Checklist: Patient identified, Emergency Drugs available, Suction available and Patient being monitored Patient Re-evaluated:Patient Re-evaluated prior to induction Oxygen Delivery Method: Circle system utilized Preoxygenation: Pre-oxygenation with 100% oxygen Induction Type: IV induction Ventilation: Mask ventilation without difficulty Laryngoscope Size: Miller and 3 Grade View: Grade I Tube type: Oral Tube size: 7.0 mm Number of attempts: 1 Airway Equipment and Method: Stylet and Oral airway Placement Confirmation: ETT inserted through vocal cords under direct vision, positive ETCO2 and breath sounds checked- equal and bilateral Secured at: 21 cm Tube secured with: Tape Dental Injury: Teeth and Oropharynx as per pre-operative assessment

## 2020-09-03 NOTE — Discharge Instructions (Addendum)
INSTRUCTIONS FOR AFTER SURGERY   You will likely have some questions about what to expect following your operation.  The following information will help you and your family understand what to expect when you are discharged from the hospital.  Following these guidelines will help ensure a smooth recovery and reduce risks of complications.  Postoperative instructions include information on: diet, wound care, medications and physical activity.  AFTER SURGERY Expect to go home after the procedure.  In some cases, you may need to spend one night in the hospital for observation.  DIET This surgery does not require a specific diet.  However, I have to mention that the healthier you eat the better your body can start healing. It is important to increasing your protein intake.  This means limiting the foods with added sugar.  Focus on fruits and vegetables and some meat. It is very important to drink water after your surgery.  If your urine is bright yellow, then it is concentrated, and you need to drink more water.  As a general rule after surgery, you should have 8 ounces of water every hour while awake.  If you find you are persistently nauseated or unable to take in liquids let us know.  NO TOBACCO USE or EXPOSURE.  This will slow your healing process and increase the risk of a wound.  WOUND CARE If you don't have a drain: You can shower the day after surgery.  Use fragrance free soap.  Dial, Dove, Ivory and Cetaphil are usually mild on the skin.  If you have a drain: Clean with baby wipes for 3-5 days and then you can shower.  If you have a binder you may remove it to shower and then put it back on. If you have steri-strips / tape directly attached to your skin leave them in place. It is OK to get these wet.  No baths, pools or hot tubs for two weeks. We close your incision to leave the smallest and best-looking scar. No ointment or creams on your incisions until given the go ahead.  Especially not  Neosporin (Too many skin reactions with this one).  A few weeks after surgery you can use Mederma and start massaging the scar. We ask you to wear your binder or sports bra for the first 6 weeks around the clock, including while sleeping. This provides added comfort and helps reduce the fluid accumulation at the surgery site.  ACTIVITY No heavy lifting until cleared by the doctor.  It is OK to walk and climb stairs. In fact, moving your legs is very important to decrease your risk of a blood clot.  It will also help keep you from getting deconditioned.  Every 1 to 2 hours get up and walk for 5 minutes. This will help with a quicker recovery back to normal.  Let pain be your guide so you don't do too much.  NO, you cannot do the spring cleaning and don't plan on taking care of anyone else.  This is your time for TLC.   WORK Everyone returns to work at different times. As a rough guide, most people take at least 1 - 2 weeks off prior to returning to work. If you need documentation for your job, bring the forms to your postoperative follow up visit.  DRIVING Arrange for someone to bring you home from the hospital.  You may be able to drive a few days after surgery but not while taking any narcotics or valium.  BOWEL   MOVEMENTS Constipation can occur after anesthesia and while taking pain medication.  It is important to stay ahead for your comfort.  We recommend taking Milk of Magnesia (2 tablespoons; twice a day) while taking the pain pills.  SEROMA This is fluid your body tried to put in the surgical site.  This is normal but if it creates excessive pain and swelling let us know.  It usually decreases in a few weeks.  MEDICATIONS and PAIN CONTROL At your preoperative visit for you history and physical you were given the following medications: An antibiotic: Start this medication when you get home and take according to the instructions on the bottle. Zofran 4 mg:  This is to treat nausea and  vomiting.  You can take this every 6 hours as needed and only if needed. Norco (hydrocodone/acetaminophen) 5/325 mg:  This is only to be used after you have taken the motrin or the tylenol. Every 8 hours as needed. Over the counter Medication to take: Ibuprofen (Motrin) 600 mg:  Take this every 6 hours.  If you have additional pain then take 500 mg of the tylenol.  Only take the Norco after you have tried these two. Miralax or stool softener of choice: Take this according to the bottle if you take the Norco.  WHEN TO CALL Call your surgeon's office if any of the following occur:  Fever 101 degrees F or greater  Excessive bleeding or fluid from the incision site.  Pain that increases over time without aid from the medications  Redness, warmth, or pus draining from incision sites  Persistent nausea or inability to take in liquids  Severe misshapen area that underwent the operation. [facility]ist  What is the benefit of having a drain?  During surgery your tissue layers are separated.  This raw surface stimulates your body to fill the space with serous fluid.  This is normal but you don't want that fluid to collect and prevent healing.  A fluid collection can also become infected.  The Jackson-Pratt (JP) drain is used to eliminate this collection of fluid and allow the tissue to heal together.    Jackson-Pratt (JP) bulb    How to care for your drainage and suction unit at home Your drainage catheter will be connected to a collection device. The vacuum caused when the device is compressed allows drainage to collect in the device.    Wash your hands with soap and water before and after touching the system. Empty the JP drain every 12 hours once you get home from your procedure. Record the fluid amount on the record sheet included. Start with stripping the drain tube to push the clots or excess fluid to the bulb.  Do this by pinching the tube with one hand near your skin.   Then with the other hand squeeze the tubing and work it toward the bulb.  This should be done several times a day.  This may collapse the tube which will correct on its own.   Use a safety pin to attach your collection device to your clothing so there is no tension on the insertion site.   If you have drainage at the skin insertion site, you can apply a gauze dressing and secure it with tape. If the drain falls out, apply a gauze dressing over the drain insertion site and secure with tape.   To empty the collection device:   Release the stopper on the top of the collection unit (bulb).  Pour contents into a measuring container such as a plastic medicine cup.  Record the day and amount of drainage on the attached sheet. This should be done at least twice a day.    To compress the Jackson-Pratt Bulb:  Release the stopper at the top of the bulb. Squeeze the bulb tightly in your fist, squeezing air out of the bulb.  Replace the stopper while the bulb is compressed.  Be careful not to spill the contents when squeezing the bulb. The drainage will start bright red and turn to pink and then yellow with time. IMPORTANT: If the bulb is not squeezed before adding the stopper it will not draw out the fluid.  Care for the JP drain site and your skin daily:  You may shower three days after surgery. Secure the drain to a ribbon or cloth around your waist while showering so it does not pull out while showering. Be sure your hands are cleaned with soap and water. Use a clean wet cotton swab to clean the skin around the drain site.  Use another cotton swab to place Vaseline or antibiotic ointment on the skin around the drain.     Contact your physician if any of the following occur:  The fluid in the bulb becomes cloudy. Your temperature is greater than 101.4.  The incision opens. If you have drainage at the skin insertion site, you can apply a gauze dressing and secure it with tape. If the drain falls  out, apply a gauze dressing over the drain insertion site and secure with tape.  You will usually have more drainage when you are active than while you rest or are asleep. If the drainage increases significantly or is bloody call the physician                             Bring this record with you to each office visit Date  Drainage Volume  Date   Drainage volume                                                                                                                                                                                          Post Anesthesia Home Care Instructions  Activity: Get plenty of rest for the remainder of the day. A responsible individual must stay with you for 24 hours following the procedure.  For the next 24 hours, DO NOT: -Drive a car -Operate machinery -Drink alcoholic beverages -Take any medication unless instructed by your physician -Make any legal decisions or sign important papers.  Meals: Start with liquid foods such as   gelatin or soup. Progress to regular foods as tolerated. Avoid greasy, spicy, heavy foods. If nausea and/or vomiting occur, drink only clear liquids until the nausea and/or vomiting subsides. Call your physician if vomiting continues.  Special Instructions/Symptoms: Your throat may feel dry or sore from the anesthesia or the breathing tube placed in your throat during surgery. If this causes discomfort, gargle with warm salt water. The discomfort should disappear within 24 hours.  If you had a scopolamine patch placed behind your ear for the management of post- operative nausea and/or vomiting:  1. The medication in the patch is effective for 72 hours, after which it should be removed.  Wrap patch in a tissue and discard in the trash. Wash hands thoroughly with soap and water. 2. You may remove the patch earlier than 72 hours if you experience unpleasant side effects which may include dry mouth, dizziness or  visual disturbances. 3. Avoid touching the patch. Wash your hands with soap and water after contact with the patch.       Apply 1/2 inch of Nitro Paste to both nipples every 8 hours for two day.      You had 5 mg of Oxycodone at 1:45PM.

## 2020-09-04 ENCOUNTER — Encounter (HOSPITAL_BASED_OUTPATIENT_CLINIC_OR_DEPARTMENT_OTHER): Payer: Self-pay | Admitting: Plastic Surgery

## 2020-09-04 LAB — SURGICAL PATHOLOGY

## 2020-09-04 NOTE — Addendum Note (Signed)
Addendum  created 09/04/20 1334 by Sita Mangen, Jewel Baize, CRNA   Charge Capture section accepted

## 2020-09-08 ENCOUNTER — Encounter: Payer: Self-pay | Admitting: Plastic Surgery

## 2020-09-08 ENCOUNTER — Telehealth: Payer: Self-pay | Admitting: Plastic Surgery

## 2020-09-08 NOTE — Telephone Encounter (Signed)
Tried to call patient again. Could not get through the phone line. Sent message via mychart.

## 2020-09-08 NOTE — Telephone Encounter (Signed)
Returned patient called. Clarified that she is able to remove the paper off her areolas, change dressing as needed. The current binder she has is a 2XL and too large. Advised can purchase a ace wrap and use on top of binder to make a snug fit. Dr. Ulice Bold did liposuction BL axillary due to the blood supply. At a later time, she will have another surgery to remove the excessive skin.  Patient understood and agreed with plan.

## 2020-09-08 NOTE — Telephone Encounter (Signed)
Patient had surgery 7/13 and is requesting further instructions on showering. Is it okay if the soap hits the incisions? Is it okay if she washes her hair and shampoo hits the incisions? She said there is some type of paper on her nipples and and she wants to know if it can be removed now. Please call her to advise.

## 2020-09-12 ENCOUNTER — Ambulatory Visit (INDEPENDENT_AMBULATORY_CARE_PROVIDER_SITE_OTHER): Payer: BC Managed Care – PPO | Admitting: Plastic Surgery

## 2020-09-12 ENCOUNTER — Other Ambulatory Visit: Payer: Self-pay

## 2020-09-12 ENCOUNTER — Encounter: Payer: Self-pay | Admitting: Plastic Surgery

## 2020-09-12 DIAGNOSIS — N62 Hypertrophy of breast: Secondary | ICD-10-CM

## 2020-09-12 NOTE — Progress Notes (Signed)
The patient is a 40 year old female here for follow-up after undergoing bilateral breast reduction.  She had however 1000 g removed from each breast.  Her drain output was minimal and I was able to remove both of them.  She does not have any sign of a hematoma or seroma.  She does have swelling and bruising as expected.  Her excess tissue in her axilla is still there but much less bulky.  I explained again that I did not want to excise it and put her at risk of loss of blood supply to the breast.  That will need to be done in a separate procedure approximately 3 to 6 months down the road.

## 2020-09-15 ENCOUNTER — Telehealth: Payer: Self-pay

## 2020-09-15 MED ORDER — MICONAZOLE NITRATE 2 % EX CREA: 1.0000 "application " | TOPICAL_CREAM | Freq: Two times a day (BID) | CUTANEOUS | 0 refills | Status: AC

## 2020-09-15 MED ORDER — FLUCONAZOLE 150 MG PO TABS: 150.0000 mg | ORAL_TABLET | Freq: Once | ORAL | 0 refills | Status: AC

## 2020-09-15 NOTE — Telephone Encounter (Signed)
Patient called to say that she has a yeast infection under each of her arms.  She said that she has the pills that we called in for her, but she needs something topical to help soothe the area.  Please call.  *Patient said her preferred pharmacy is the [facility] on [address] in [place].

## 2020-09-15 NOTE — Telephone Encounter (Signed)
Concern for yeast infection.

## 2020-09-15 NOTE — Telephone Encounter (Signed)
Spoke with the patient and informed her that Dr. Ulice Bold has sent in a topical cream for her to the pharmacy.  Patient verbalized understanding and agreed.//AB/CMA

## 2020-09-15 NOTE — Telephone Encounter (Signed)
Patient was aware and picked up new prescription.//AB/CMA

## 2020-09-26 ENCOUNTER — Other Ambulatory Visit: Payer: Self-pay

## 2020-09-26 ENCOUNTER — Ambulatory Visit (INDEPENDENT_AMBULATORY_CARE_PROVIDER_SITE_OTHER): Payer: BC Managed Care – PPO | Admitting: Surgical

## 2020-09-26 DIAGNOSIS — G8929 Other chronic pain: Secondary | ICD-10-CM

## 2020-09-26 DIAGNOSIS — M546 Pain in thoracic spine: Secondary | ICD-10-CM

## 2020-09-26 DIAGNOSIS — M542 Cervicalgia: Secondary | ICD-10-CM

## 2020-09-26 DIAGNOSIS — N62 Hypertrophy of breast: Secondary | ICD-10-CM

## 2020-09-26 NOTE — Progress Notes (Signed)
Patient is a 40 year old female here for follow-up after bilateral breast reduction with Dr. Ulice Bold on 09/03/2020.  She is 3 weeks postop.  She reports that in regards to her breasts she is doing well but in regards to the excess axillary tissue she is having a lot of pain and tenderness.  She did recently start wearing a new bra as well and the dye has caused some skin color changes.  She reports that she has been using prescribed creams and ointments on her bilateral axillary tissue folds for significant irritation and pain.    Chaperone present on exam On exam bilateral NAC's are viable, bilateral breast incisions are intact.  She has significant rash within the axillary folds of bilateral axilla.  There is no cellulitic changes.  There is no breast wounds noted.  No subcutaneous fluid collections noted with palpation.  Recommend continuing to use miconazole creams and nystatin powder as needed for the yeast infection. Discussed with patient we could schedule excision of excess axillary tissue 3 to 6 months after her initial procedure which would be October to December of this year.  Will discuss with surgical team.  Pictures were taken and placed in the patient's chart with patient's permission.  Recommend calling with questions or concerns.  Recommend following up in 3 to 4 weeks for reevaluation.  There is no sign of infection.

## 2020-10-13 ENCOUNTER — Encounter: Payer: Self-pay | Admitting: Plastic Surgery

## 2020-10-13 ENCOUNTER — Ambulatory Visit (INDEPENDENT_AMBULATORY_CARE_PROVIDER_SITE_OTHER): Payer: BC Managed Care – PPO | Admitting: Plastic Surgery

## 2020-10-13 ENCOUNTER — Other Ambulatory Visit: Payer: Self-pay

## 2020-10-13 DIAGNOSIS — N62 Hypertrophy of breast: Secondary | ICD-10-CM

## 2020-10-13 NOTE — Progress Notes (Signed)
The patient is a 40 year old female here for follow up on her incision sites.  She called over the weekend with some concerns and fear of the incision opening.  She has a very superficial opening at the vertical limb on each side.  But overall she is healing really well.  There is no sign of seroma, the incisions seem to be healing very nicely.  She also notices some yeast infection under her axillary skin folds.  She is doing a great job taking care of this.  I will plan to see her back in a week.  We have tentatively scheduled excision of the axilla in December.

## 2020-10-17 DIAGNOSIS — K219 Gastro-esophageal reflux disease without esophagitis: Secondary | ICD-10-CM | POA: Diagnosis not present

## 2020-10-17 DIAGNOSIS — K760 Fatty (change of) liver, not elsewhere classified: Secondary | ICD-10-CM | POA: Diagnosis not present

## 2020-10-17 DIAGNOSIS — N3001 Acute cystitis with hematuria: Secondary | ICD-10-CM | POA: Diagnosis not present

## 2020-10-17 DIAGNOSIS — Z886 Allergy status to analgesic agent status: Secondary | ICD-10-CM | POA: Diagnosis not present

## 2020-10-17 DIAGNOSIS — Z882 Allergy status to sulfonamides status: Secondary | ICD-10-CM | POA: Diagnosis not present

## 2020-10-17 DIAGNOSIS — Z79899 Other long term (current) drug therapy: Secondary | ICD-10-CM | POA: Diagnosis not present

## 2020-10-17 DIAGNOSIS — N3289 Other specified disorders of bladder: Secondary | ICD-10-CM | POA: Diagnosis not present

## 2020-10-17 DIAGNOSIS — R300 Dysuria: Secondary | ICD-10-CM | POA: Diagnosis not present

## 2020-10-17 DIAGNOSIS — Z885 Allergy status to narcotic agent status: Secondary | ICD-10-CM | POA: Diagnosis not present

## 2020-10-17 DIAGNOSIS — R1084 Generalized abdominal pain: Secondary | ICD-10-CM | POA: Diagnosis not present

## 2020-10-17 DIAGNOSIS — R109 Unspecified abdominal pain: Secondary | ICD-10-CM | POA: Diagnosis not present

## 2020-10-17 DIAGNOSIS — K449 Diaphragmatic hernia without obstruction or gangrene: Secondary | ICD-10-CM | POA: Diagnosis not present

## 2020-10-17 DIAGNOSIS — E739 Lactose intolerance, unspecified: Secondary | ICD-10-CM | POA: Diagnosis not present

## 2020-10-17 DIAGNOSIS — R319 Hematuria, unspecified: Secondary | ICD-10-CM | POA: Diagnosis not present

## 2020-10-17 DIAGNOSIS — N83201 Unspecified ovarian cyst, right side: Secondary | ICD-10-CM | POA: Diagnosis not present

## 2020-10-17 DIAGNOSIS — R1030 Lower abdominal pain, unspecified: Secondary | ICD-10-CM | POA: Diagnosis not present

## 2020-10-18 DIAGNOSIS — R1030 Lower abdominal pain, unspecified: Secondary | ICD-10-CM | POA: Diagnosis not present

## 2020-10-21 DIAGNOSIS — B9620 Unspecified Escherichia coli [E. coli] as the cause of diseases classified elsewhere: Secondary | ICD-10-CM | POA: Diagnosis not present

## 2020-10-21 DIAGNOSIS — N390 Urinary tract infection, site not specified: Secondary | ICD-10-CM | POA: Diagnosis not present

## 2020-10-21 DIAGNOSIS — N898 Other specified noninflammatory disorders of vagina: Secondary | ICD-10-CM | POA: Diagnosis not present

## 2020-10-22 ENCOUNTER — Ambulatory Visit (INDEPENDENT_AMBULATORY_CARE_PROVIDER_SITE_OTHER): Payer: BC Managed Care – PPO | Admitting: Surgical

## 2020-10-22 ENCOUNTER — Other Ambulatory Visit: Payer: Self-pay

## 2020-10-22 DIAGNOSIS — Z719 Counseling, unspecified: Secondary | ICD-10-CM

## 2020-10-22 DIAGNOSIS — G8929 Other chronic pain: Secondary | ICD-10-CM

## 2020-10-22 DIAGNOSIS — N62 Hypertrophy of breast: Secondary | ICD-10-CM

## 2020-10-22 DIAGNOSIS — M542 Cervicalgia: Secondary | ICD-10-CM

## 2020-10-22 DIAGNOSIS — M546 Pain in thoracic spine: Secondary | ICD-10-CM

## 2020-10-22 NOTE — Progress Notes (Signed)
Patient is a 40 year old female here for follow-up after bilateral breast reduction on 09/03/2020 with Dr. Ulice Bold.  She reports overall in regards to the breast reduction she is doing well, she is very pleased.  She is scheduled for excision of excess axillary tissue with Dr. Ulice Bold on 01/29/2021.  She is having a lot of tenderness in the axillary folds.  She has been applying powders and ointments in the area to prevent yeast infections.  Chaperone present on exam On exam bilateral NAC's are viable, bilateral breast incisions are intact.  Excess axillary tissue noted, powder noted within the folds.  No rashes noted today.  No cellulitic changes noted.  No subcutaneous fluid collections noted with palpation.  Recommend continuing with compressive garment for 1 more month.  Can begin using scar cream.  No restrictions.  Recommend following up as needed.  She is scheduled for preop appointment prior to her excision of excess axillary tissue.  Recommend calling with questions or concerns.

## 2020-10-31 DIAGNOSIS — N898 Other specified noninflammatory disorders of vagina: Secondary | ICD-10-CM | POA: Diagnosis not present

## 2020-10-31 DIAGNOSIS — F419 Anxiety disorder, unspecified: Secondary | ICD-10-CM | POA: Diagnosis not present

## 2020-10-31 DIAGNOSIS — R635 Abnormal weight gain: Secondary | ICD-10-CM | POA: Diagnosis not present

## 2020-10-31 DIAGNOSIS — N3001 Acute cystitis with hematuria: Secondary | ICD-10-CM | POA: Diagnosis not present

## 2020-10-31 DIAGNOSIS — F32A Depression, unspecified: Secondary | ICD-10-CM | POA: Diagnosis not present

## 2020-11-20 ENCOUNTER — Ambulatory Visit (INDEPENDENT_AMBULATORY_CARE_PROVIDER_SITE_OTHER): Payer: BC Managed Care – PPO | Admitting: Family Medicine

## 2020-11-20 ENCOUNTER — Encounter (INDEPENDENT_AMBULATORY_CARE_PROVIDER_SITE_OTHER): Payer: Self-pay

## 2020-11-24 ENCOUNTER — Encounter (INDEPENDENT_AMBULATORY_CARE_PROVIDER_SITE_OTHER): Payer: Self-pay | Admitting: Family Medicine

## 2020-11-24 ENCOUNTER — Other Ambulatory Visit: Payer: Self-pay

## 2020-11-24 ENCOUNTER — Ambulatory Visit (INDEPENDENT_AMBULATORY_CARE_PROVIDER_SITE_OTHER): Payer: BC Managed Care – PPO | Admitting: Family Medicine

## 2020-11-24 VITALS — BP 117/71 | HR 63 | Temp 98.2°F | Ht 67.0 in | Wt 279.0 lb

## 2020-11-24 DIAGNOSIS — E559 Vitamin D deficiency, unspecified: Secondary | ICD-10-CM

## 2020-11-24 DIAGNOSIS — E8881 Metabolic syndrome: Secondary | ICD-10-CM

## 2020-11-24 DIAGNOSIS — Z9189 Other specified personal risk factors, not elsewhere classified: Secondary | ICD-10-CM

## 2020-11-24 DIAGNOSIS — Z6839 Body mass index (BMI) 39.0-39.9, adult: Secondary | ICD-10-CM | POA: Diagnosis not present

## 2020-11-24 MED ORDER — METFORMIN HCL 500 MG PO TABS: 500.0000 mg | ORAL_TABLET | Freq: Two times a day (BID) | ORAL | 0 refills | Status: DC

## 2020-11-24 MED ORDER — VITAMIN D (ERGOCALCIFEROL) 1.25 MG (50000 UNIT) PO CAPS: 50000.0000 [IU] | ORAL_CAPSULE | ORAL | 0 refills | Status: DC

## 2020-11-25 NOTE — Progress Notes (Addendum)
Chief Complaint:   OBESITY Kimberly Mckay is here to discuss her progress with her obesity treatment plan along with follow-up of her obesity related diagnoses. Kimberly Mckay is on the Category 3 Plan with intermittent fasting and states she is following her eating plan approximately 0% of the time. Kimberly Mckay states she is doing 0 minutes 0 times per week.  Today's visit was #: 8 Starting weight: 254 lbs Starting date: 03/20/2020 Today's weight: 279 lbs Today's date: 11/24/2020 Total lbs lost to date: 0 Total lbs lost since last in-office visit: 0  Interim History:   - This is Kimberly Mckay's first appointment since June.  - She had breast reduction surgery on July 13th.    - She is not following any meal plan of ours but she did [employer] for 1 week- couldn't continue because she hated the food.  - She is not fasting today.    Subjective:   1. Insulin resistance Kimberly Mckay went off her metformin for side effects and she never restarted it months ago. She is not sure if it helped as she wasn't consistent with it.  2. Vitamin D deficiency Kimberly Mckay is currently taking prescription vitamin D 50,000 IU each week. She denies nausea, vomiting or muscle weakness.  3. At risk for medication nonadherence Kimberly Mckay is at a higher than average risk for medication non-adherence due to history.  Assessment/Plan:  No orders of the defined types were placed in this encounter.   Medications Discontinued During This Encounter  Medication Reason   Vitamin D, Ergocalciferol, (DRISDOL) 1.25 MG (50000 UNIT) CAPS capsule Reorder     Meds ordered this encounter  Medications   Vitamin D, Ergocalciferol, (DRISDOL) 1.25 MG (50000 UNIT) CAPS capsule    Sig: Take 1 capsule (50,000 Units total) by mouth every 7 (seven) days.    Dispense:  4 capsule    Refill:  0    30 d supply;  ** OV for RF **   Do not send RF request   metFORMIN (GLUCOPHAGE) 500 MG tablet    Sig: Take 1 tablet (500 mg total) by mouth 2  (two) times daily with a meal.    Dispense:  60 tablet    Refill:  0    30 d supply;  ** OV for RF **   Do not send RF request     1. Insulin resistance Kimberly Mckay will continue to work on weight loss, exercise, and decreasing simple carbohydrates to help decrease the risk of diabetes. We will refill metformin for 1 month. Contraindication for GLP-1 as her mother has thyroid cancer. Kimberly Mckay agreed to follow-up with [place] as directed to closely monitor her progress.  - metFORMIN (GLUCOPHAGE) 500 MG tablet; Take 1 tablet (500 mg total) by mouth 2 (two) times daily with a meal.  Dispense: 60 tablet; Refill: 0  2. Vitamin D deficiency Low Vitamin D level contributes to fatigue and are associated with obesity, breast, and colon cancer. We will refill prescription Vitamin D for 1 month. Kimberly Mckay will follow-up for routine testing of Vitamin D, at least 2-3 times per year to avoid over-replacement.  - Vitamin D, Ergocalciferol, (DRISDOL) 1.25 MG (50000 UNIT) CAPS capsule; Take 1 capsule (50,000 Units total) by mouth every 7 (seven) days.  Dispense: 4 capsule; Refill: 0  3. At risk for medication nonadherence Kimberly Mckay was given approximately 9 minutes of impaired  metabolic function prevention counseling today. We discussed intensive lifestyle modifications today with an emphasis on specific nutrition and exercise instructions and strategies.  Repetitive spaced learning was employed today to elicit superior memory formation and behavioral change.  4. Obesity with current BMI of 43.7 Kimberly Mckay is currently in the action stage of change. As such, her goal is to continue with weight loss efforts. She has agreed to change meal plan and keeping a food journal and adhering to recommended goals of 1700-1800 calories and 120+ grams of protein daily.  Kimberly Mckay will bring in her log of calories and grams of protein to her next office visit for our review.  We will recheck fasting blood work at her next  office visit if possible.  Exercise goals: some exercise is better than nothing.  Behavioral modification strategies: keeping healthy foods in the home, planning for success, and keeping a strict food journal.  Kimberly Mckay has agreed to follow-up with our clinic in 2 to 3 weeks- she was told to come fasting for bldwrk.  She was informed of the importance of frequent follow-up visits to maximize her success with intensive lifestyle modifications for her multiple health conditions.   Objective:   Blood pressure 117/71, pulse 63, temperature 98.2 F (36.8 C), height 5\' 7"  (1.702 m), weight 279 lb (126.6 kg), SpO2 93 %. Body mass index is 43.7 kg/m.  General: Cooperative, alert, well developed, in no acute distress. HEENT: Conjunctivae and lids unremarkable. Cardiovascular: Regular rhythm.  Lungs: Normal work of breathing. Neurologic: No focal deficits.   Lab Results  Component Value Date   CREATININE 0.81 03/20/2020   BUN 11 03/20/2020   NA 140 03/20/2020   K 4.1 03/20/2020   CL 104 03/20/2020   CO2 24 03/20/2020   Lab Results  Component Value Date   ALT 8 03/20/2020   AST 18 03/20/2020   ALKPHOS 101 03/20/2020   BILITOT <0.2 03/20/2020   Lab Results  Component Value Date   HGBA1C 5.5 03/20/2020   Lab Results  Component Value Date   INSULIN 16.2 03/20/2020   Lab Results  Component Value Date   TSH 1.770 03/20/2020   Lab Results  Component Value Date   CHOL 153 03/20/2020   HDL 55 03/20/2020   LDLCALC 84 03/20/2020   TRIG 72 03/20/2020   CHOLHDL 2.8 03/20/2020   Lab Results  Component Value Date   VD25OH 19.7 (L) 03/20/2020   Lab Results  Component Value Date   WBC 4.7 03/20/2020   HGB 13.9 03/20/2020   HCT 41.7 03/20/2020   MCV 85 03/20/2020   PLT 201 03/20/2020   No results found for: IRON, TIBC, FERRITIN  Attestation Statements:   Reviewed by clinician on day of visit: allergies, medications, problem list, medical history, surgical history, family  history, social history, and previous encounter notes.   03/22/2020, am acting as transcriptionist for Trude Mcburney, DO.  I have reviewed the above documentation for accuracy and completeness, and I agree with the above. [employer], D.O.  The 21st Century Cures Act was signed into law in 2016 which includes the topic of electronic health records.  This provides immediate access to information in MyChart.  This includes consultation notes, operative notes, office notes, lab results and pathology reports.  If you have any questions about what you read please let 2017 know at your next visit so we can discuss your concerns and take corrective action if need be.  We are right here with you.

## 2020-11-28 DIAGNOSIS — N390 Urinary tract infection, site not specified: Secondary | ICD-10-CM | POA: Diagnosis not present

## 2020-11-28 DIAGNOSIS — R609 Edema, unspecified: Secondary | ICD-10-CM | POA: Diagnosis not present

## 2020-12-04 DIAGNOSIS — R300 Dysuria: Secondary | ICD-10-CM | POA: Diagnosis not present

## 2020-12-15 ENCOUNTER — Ambulatory Visit (INDEPENDENT_AMBULATORY_CARE_PROVIDER_SITE_OTHER): Payer: BC Managed Care – PPO | Admitting: Adult Health

## 2020-12-16 DIAGNOSIS — N390 Urinary tract infection, site not specified: Secondary | ICD-10-CM | POA: Diagnosis not present

## 2020-12-29 ENCOUNTER — Ambulatory Visit (INDEPENDENT_AMBULATORY_CARE_PROVIDER_SITE_OTHER): Payer: BC Managed Care – PPO | Admitting: Family Medicine

## 2021-01-08 ENCOUNTER — Encounter (INDEPENDENT_AMBULATORY_CARE_PROVIDER_SITE_OTHER): Payer: Self-pay

## 2021-01-12 ENCOUNTER — Ambulatory Visit: Payer: BC Managed Care – PPO

## 2021-01-12 ENCOUNTER — Ambulatory Visit (INDEPENDENT_AMBULATORY_CARE_PROVIDER_SITE_OTHER): Payer: BC Managed Care – PPO | Admitting: Family Medicine

## 2021-01-12 NOTE — H&P (View-Only) (Signed)
Patient ID: Kimberly Mckay, female    DOB: 04-05-1980, 40 y.o.   MRN: [id number]  Chief Complaint  Patient presents with   Pre-op Exam      ICD-10-CM   1. S/P bilateral breast reduction  Z98.890        History of Present Illness: Kimberly Mckay is a 40 y.o.  female  with a history of macromastia s/p bilateral breast reduction performed 09/03/2020.  She presents for preoperative evaluation for upcoming procedure, excision of excess axillary tissue, scheduled for 01/29/2021 with Dr. Marla Roe.  The patient has had problems with anesthesia.  She reports that she had previous aspiration and that she now takes a proton pump inhibitor 1 week prior to surgery to help mitigate reflux of gastric contents intraoperatively.  She reports that she had extreme constipation after last surgery that caused her to almost seek evaluation in the ER.  Discussed stool softeners, laxatives, and increased hydration postoperatively.  She has a remote history of asthma, but now only requires an inhaler approximately once per year and usually in the spring/related to seasonal allergies.  She takes ibuprofen occasionally for generalized pain symptoms, but otherwise only takes a medication for overactive bladder disease.  No other regular medications.  She denies any personal or family history of blood clots or clotting disorder.  No personal history of malignancy.  She does not smoke and states that she is not diabetic or even prediabetic.  Summary of Previous Visit: She was last seen here in clinic on 10/12/2020.  At that time, she complained of ongoing tenderness in her axillary folds bilaterally.  She was applying powders and ointments to axillary regions to mitigate risk of fungal infection.  No rashes were noted on exam.  Plan was for continuation of compression garment x1 month and scar cream over incisions.  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  PMH Significant for:  Macromastia s/p breast reduction, obesity, overactive bladder disease.   Past Medical History: Allergies: Allergies  Allergen Reactions   Aspirin Hives   Butorphanol Other (See Comments)    Other reaction(s): Psychosis (intolerance) Hallucinations    Propoxyphene Hives   Sulfa Antibiotics Nausea And Vomiting   Meperidine Rash    Hives      Current Medications:  Current Outpatient Medications:    albuterol (VENTOLIN HFA) 108 (90 Base) MCG/ACT inhaler, Inhale into the lungs., Disp: , Rfl:    ALPRAZolam (XANAX) 0.5 MG tablet, Take 0.5 mg by mouth at bedtime as needed for anxiety., Disp: , Rfl:    cephALEXin (KEFLEX) 500 MG capsule, Take 1 capsule (500 mg total) by mouth 4 (four) times daily for 3 days., Disp: 12 capsule, Rfl: 0   HYDROcodone-acetaminophen (NORCO) 5-325 MG tablet, Take 1 tablet by mouth every 6 (six) hours as needed for up to 5 days for severe pain., Disp: 20 tablet, Rfl: 0   ibuprofen (ADVIL,MOTRIN) 600 MG tablet, Take 600 mg by mouth every 6 (six) hours as needed., Disp: , Rfl:    lansoprazole (PREVACID) 15 MG capsule, Take 15 mg by mouth daily at 12 noon., Disp: , Rfl:    multivitamin (VIT W/EXTRA C) CHEW chewable tablet, Chew by mouth., Disp: , Rfl:    ondansetron (ZOFRAN-ODT) 4 MG disintegrating tablet, Take 1 tablet (4 mg total) by mouth every 8 (eight) hours as needed for nausea or vomiting., Disp: 20 tablet, Rfl: 0   phenazopyridine (PYRIDIUM) 200 MG tablet, Take 200 mg by mouth 3 (three) times  daily., Disp: , Rfl:   Past Medical Problems: Past Medical History:  Diagnosis Date   Anemia    Anxiety    Asthma    Back pain    Complication of anesthesia    pt aspirated following upper GI   Constipation    COVID-19 04/2019   Depression    DJD (degenerative joint disease)    Eczema    Fibromyalgia    Prediabetes    SOBOE (shortness of breath on exertion)    Swelling of both lower extremities     Past Surgical History: Past Surgical History:   Procedure Laterality Date   BREAST REDUCTION SURGERY Bilateral 09/03/2020   Procedure: BILATERAL BREAST REDUCTION WITH LIPOSUCTION;  Surgeon: Peggye Form, DO;  Location: [facility];  Service: Plastics;  Laterality: Bilateral;  4 hours total   UPPER GI ENDOSCOPY     pt states she aspirated with this procedure   WISDOM TOOTH EXTRACTION      Social History: Social History   Socioeconomic History   Marital status: Married    Spouse name: Xoey Warmoth   Number of children: 2   Years of education: Not on file   Highest education level: Not on file  Occupational History   Occupation: Occupational psychologist  Tobacco Use   Smoking status: Former    Packs/day: 0.50    Years: 20.00    Pack years: 10.00    Types: Cigarettes    Quit date: 2019    Years since quitting: 3.8   Smokeless tobacco: Never  Vaping Use   Vaping Use: Never used  Substance and Sexual Activity   Alcohol use: Not Currently   Drug use: No   Sexual activity: Not on file  Other Topics Concern   Not on file  Social History Narrative   Not on file   Social Determinants of Health   Financial Resource Strain: Not on file  Food Insecurity: Not on file  Transportation Needs: Not on file  Physical Activity: Not on file  Stress: Not on file  Social Connections: Not on file  Intimate Partner Violence: Not on file    Family History: Family History  Problem Relation Age of Onset   Diabetes Mother    Heart disease Mother    Cancer Mother    Thyroid disease Mother    Obesity Mother    Hypertension Father    Hyperlipidemia Father    Alcoholism Father     Review of Systems: ROS No recent illness, infection, rashes, fevers or chills  Physical Exam: Vital Signs BP 122/75 (BP Location: Left Arm, Patient Position: Sitting, Cuff Size: Large)   Pulse 78   Ht 5\' 7"  (1.702 m)   Wt 278 lb 12.8 oz (126.5 kg)   SpO2 96%   BMI 43.67 kg/m   Physical Exam Constitutional:       General: Not in acute distress.    Appearance: Normal appearance. Not ill-appearing.  HENT:     Head: Normocephalic and atraumatic.  Eyes:     Pupils: Pupils are equal, round Neck:     Musculoskeletal: Normal range of motion.  Cardiovascular:     Rate and Rhythm: Normal rate    Pulses: Normal pulses.  Pulmonary:     Effort: Pulmonary effort is normal. No respiratory distress.  Abdominal:     General: Abdomen is flat. There is no distension.  Musculoskeletal: Normal range of motion.  No lower extremity swelling or edema.  No varicosities  noted.  Peripheral pulses intact.  Ambulatory. Skin:    General: Skin is warm and dry.     Findings: No erythema or rash.  Neurological:     General: No focal deficit present.     Mental Status: Alert and oriented to person, place, and time. Mental status is at baseline.     Motor: No weakness.  Psychiatric:        Mood and Affect: Mood normal.        Behavior: Behavior normal.    Assessment/Plan: The patient is scheduled for excision of excess axillary tissue 01/29/2021 with Dr. Marla Roe.  Risks, benefits, and alternatives of procedure discussed, questions answered and consent obtained.    Smoking Status: Non-smoker. Last Mammogram: 05/2020; Results: BI-RADS Category 4, suspicious.  Pathology afterwards revealed fibroadenoma right breast 11 o'clock position.  Caprini Score: 3; Risk Factors include: BMI greater than 25 and length of planned surgery. Recommendation for mechanical prophylaxis. Encourage early ambulation.   Pictures obtained: Today and 09/26/2020  Post-op Rx sent to pharmacy: Norco, Keflex, Zofran  Patient was provided with the General Surgical Risk consent document and Pain Medication Agreement prior to their appointment.  They had adequate time to read through the risk consent documents and Pain Medication Agreement. We also discussed them in person together during this preop appointment. All of their questions were answered to  their satisfaction.  Recommended calling if they have any further questions.  Risk consent form and Pain Medication Agreement to be scanned into patient's chart.  The risk that can be encountered with breast reduction were discussed and include the following but not limited to these:  Breast asymmetry, fluid accumulation, firmness of the breast, inability to breast feed, loss of nipple or areola, skin loss, decrease or no nipple sensation, fat necrosis of the breast tissue, bleeding, infection, healing delay.  There are risks of anesthesia, changes to skin sensation and injury to nerves or blood vessels.  The muscle can be temporarily or permanently injured.  You may have an allergic reaction to tape, suture, glue, blood products which can result in skin discoloration, swelling, pain, skin lesions, poor healing.  Any of these can lead to the need for revisonal surgery or stage procedures.  A reduction has potential to interfere with diagnostic procedures.  Nipple or breast piercing can increase risks of infection.  This procedure is best done when the breast is fully developed.  Changes in the breast will continue to occur over time.  Pregnancy can alter the outcomes of previous breast reduction surgery, weight gain and weigh loss can also effect the long term appearance.   The risks that can be encountered with and after liposuction were discussed and include the following but no limited to these: Asymmetry, fluid accumulation, firmness of the area, fat necrosis with death of fat tissue, bleeding, infection, delayed healing, anesthesia risks, skin sensation changes, injury to structures including nerves, blood vessels, and muscles which may be temporary or permanent, allergies to tape, suture materials and glues, blood products, topical preparations or injected agents, skin and contour irregularities, skin discoloration and swelling, deep vein thrombosis, cardiac and pulmonary complications, pain, which may  persist, persistent pain, recurrence of the lesion, poor healing of the incision, possible need for revisional surgery or staged procedures. Thiere can also be persistent swelling, poor wound healing, rippling or loose skin, worsening of cellulite, swelling, and thermal burn or heat injury from ultrasound with the ultrasound-assisted lipoplasty technique. Any change in weight fluctuations can alter the  outcome.    Electronically signed by: Krista Blue, PA-C 01/13/2021 10:14 AM

## 2021-01-12 NOTE — Progress Notes (Signed)
Patient ID: Kimberly Mckay, female    DOB: May 11, 1980, 40 y.o.   MRN: [id number]  Chief Complaint  Patient presents with   Pre-op Exam      ICD-10-CM   1. S/P bilateral breast reduction  Z98.890        History of Present Illness: Kimberly Mckay is a 40 y.o.  female  with a history of macromastia s/p bilateral breast reduction performed 09/03/2020.  She presents for preoperative evaluation for upcoming procedure, excision of excess axillary tissue, scheduled for 01/29/2021 with Dr. Marla Roe.  The patient has had problems with anesthesia.  She reports that she had previous aspiration and that she now takes a proton pump inhibitor 1 week prior to surgery to help mitigate reflux of gastric contents intraoperatively.  She reports that she had extreme constipation after last surgery that caused her to almost seek evaluation in the ER.  Discussed stool softeners, laxatives, and increased hydration postoperatively.  She has a remote history of asthma, but now only requires an inhaler approximately once per year and usually in the spring/related to seasonal allergies.  She takes ibuprofen occasionally for generalized pain symptoms, but otherwise only takes a medication for overactive bladder disease.  No other regular medications.  She denies any personal or family history of blood clots or clotting disorder.  No personal history of malignancy.  She does not smoke and states that she is not diabetic or even prediabetic.  Summary of Previous Visit: She was last seen here in clinic on 10/12/2020.  At that time, she complained of ongoing tenderness in her axillary folds bilaterally.  She was applying powders and ointments to axillary regions to mitigate risk of fungal infection.  No rashes were noted on exam.  Plan was for continuation of compression garment x1 month and scar cream over incisions.  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  PMH Significant for:  Macromastia s/p breast reduction, obesity, overactive bladder disease.   Past Medical History: Allergies: Allergies  Allergen Reactions   Aspirin Hives   Butorphanol Other (See Comments)    Other reaction(s): Psychosis (intolerance) Hallucinations    Propoxyphene Hives   Sulfa Antibiotics Nausea And Vomiting   Meperidine Rash    Hives      Current Medications:  Current Outpatient Medications:    albuterol (VENTOLIN HFA) 108 (90 Base) MCG/ACT inhaler, Inhale into the lungs., Disp: , Rfl:    ALPRAZolam (XANAX) 0.5 MG tablet, Take 0.5 mg by mouth at bedtime as needed for anxiety., Disp: , Rfl:    cephALEXin (KEFLEX) 500 MG capsule, Take 1 capsule (500 mg total) by mouth 4 (four) times daily for 3 days., Disp: 12 capsule, Rfl: 0   HYDROcodone-acetaminophen (NORCO) 5-325 MG tablet, Take 1 tablet by mouth every 6 (six) hours as needed for up to 5 days for severe pain., Disp: 20 tablet, Rfl: 0   ibuprofen (ADVIL,MOTRIN) 600 MG tablet, Take 600 mg by mouth every 6 (six) hours as needed., Disp: , Rfl:    lansoprazole (PREVACID) 15 MG capsule, Take 15 mg by mouth daily at 12 noon., Disp: , Rfl:    multivitamin (VIT W/EXTRA C) CHEW chewable tablet, Chew by mouth., Disp: , Rfl:    ondansetron (ZOFRAN-ODT) 4 MG disintegrating tablet, Take 1 tablet (4 mg total) by mouth every 8 (eight) hours as needed for nausea or vomiting., Disp: 20 tablet, Rfl: 0   phenazopyridine (PYRIDIUM) 200 MG tablet, Take 200 mg by mouth 3 (three) times  daily., Disp: , Rfl:   Past Medical Problems: Past Medical History:  Diagnosis Date   Anemia    Anxiety    Asthma    Back pain    Complication of anesthesia    pt aspirated following upper GI   Constipation    COVID-19 04/2019   Depression    DJD (degenerative joint disease)    Eczema    Fibromyalgia    Prediabetes    SOBOE (shortness of breath on exertion)    Swelling of both lower extremities     Past Surgical History: Past Surgical History:   Procedure Laterality Date   BREAST REDUCTION SURGERY Bilateral 09/03/2020   Procedure: BILATERAL BREAST REDUCTION WITH LIPOSUCTION;  Surgeon: Peggye Form, DO;  Location: [facility];  Service: Plastics;  Laterality: Bilateral;  4 hours total   UPPER GI ENDOSCOPY     pt states she aspirated with this procedure   WISDOM TOOTH EXTRACTION      Social History: Social History   Socioeconomic History   Marital status: Married    Spouse name: Xoey Warmoth   Number of children: 2   Years of education: Not on file   Highest education level: Not on file  Occupational History   Occupation: Occupational psychologist  Tobacco Use   Smoking status: Former    Packs/day: 0.50    Years: 20.00    Pack years: 10.00    Types: Cigarettes    Quit date: 2019    Years since quitting: 3.8   Smokeless tobacco: Never  Vaping Use   Vaping Use: Never used  Substance and Sexual Activity   Alcohol use: Not Currently   Drug use: No   Sexual activity: Not on file  Other Topics Concern   Not on file  Social History Narrative   Not on file   Social Determinants of Health   Financial Resource Strain: Not on file  Food Insecurity: Not on file  Transportation Needs: Not on file  Physical Activity: Not on file  Stress: Not on file  Social Connections: Not on file  Intimate Partner Violence: Not on file    Family History: Family History  Problem Relation Age of Onset   Diabetes Mother    Heart disease Mother    Cancer Mother    Thyroid disease Mother    Obesity Mother    Hypertension Father    Hyperlipidemia Father    Alcoholism Father     Review of Systems: ROS No recent illness, infection, rashes, fevers or chills  Physical Exam: Vital Signs BP 122/75 (BP Location: Left Arm, Patient Position: Sitting, Cuff Size: Large)   Pulse 78   Ht 5\' 7"  (1.702 m)   Wt 278 lb 12.8 oz (126.5 kg)   SpO2 96%   BMI 43.67 kg/m   Physical Exam Constitutional:       General: Not in acute distress.    Appearance: Normal appearance. Not ill-appearing.  HENT:     Head: Normocephalic and atraumatic.  Eyes:     Pupils: Pupils are equal, round Neck:     Musculoskeletal: Normal range of motion.  Cardiovascular:     Rate and Rhythm: Normal rate    Pulses: Normal pulses.  Pulmonary:     Effort: Pulmonary effort is normal. No respiratory distress.  Abdominal:     General: Abdomen is flat. There is no distension.  Musculoskeletal: Normal range of motion.  No lower extremity swelling or edema.  No varicosities  noted.  Peripheral pulses intact.  Ambulatory. Skin:    General: Skin is warm and dry.     Findings: No erythema or rash.  Neurological:     General: No focal deficit present.     Mental Status: Alert and oriented to person, place, and time. Mental status is at baseline.     Motor: No weakness.  Psychiatric:        Mood and Affect: Mood normal.        Behavior: Behavior normal.    Assessment/Plan: The patient is scheduled for excision of excess axillary tissue 01/29/2021 with Dr. Marla Roe.  Risks, benefits, and alternatives of procedure discussed, questions answered and consent obtained.    Smoking Status: Non-smoker. Last Mammogram: 05/2020; Results: BI-RADS Category 4, suspicious.  Pathology afterwards revealed fibroadenoma right breast 11 o'clock position.  Caprini Score: 3; Risk Factors include: BMI greater than 25 and length of planned surgery. Recommendation for mechanical prophylaxis. Encourage early ambulation.   Pictures obtained: Today and 09/26/2020  Post-op Rx sent to pharmacy: Norco, Keflex, Zofran  Patient was provided with the General Surgical Risk consent document and Pain Medication Agreement prior to their appointment.  They had adequate time to read through the risk consent documents and Pain Medication Agreement. We also discussed them in person together during this preop appointment. All of their questions were answered to  their satisfaction.  Recommended calling if they have any further questions.  Risk consent form and Pain Medication Agreement to be scanned into patient's chart.  The risk that can be encountered with breast reduction were discussed and include the following but not limited to these:  Breast asymmetry, fluid accumulation, firmness of the breast, inability to breast feed, loss of nipple or areola, skin loss, decrease or no nipple sensation, fat necrosis of the breast tissue, bleeding, infection, healing delay.  There are risks of anesthesia, changes to skin sensation and injury to nerves or blood vessels.  The muscle can be temporarily or permanently injured.  You may have an allergic reaction to tape, suture, glue, blood products which can result in skin discoloration, swelling, pain, skin lesions, poor healing.  Any of these can lead to the need for revisonal surgery or stage procedures.  A reduction has potential to interfere with diagnostic procedures.  Nipple or breast piercing can increase risks of infection.  This procedure is best done when the breast is fully developed.  Changes in the breast will continue to occur over time.  Pregnancy can alter the outcomes of previous breast reduction surgery, weight gain and weigh loss can also effect the long term appearance.   The risks that can be encountered with and after liposuction were discussed and include the following but no limited to these: Asymmetry, fluid accumulation, firmness of the area, fat necrosis with death of fat tissue, bleeding, infection, delayed healing, anesthesia risks, skin sensation changes, injury to structures including nerves, blood vessels, and muscles which may be temporary or permanent, allergies to tape, suture materials and glues, blood products, topical preparations or injected agents, skin and contour irregularities, skin discoloration and swelling, deep vein thrombosis, cardiac and pulmonary complications, pain, which may  persist, persistent pain, recurrence of the lesion, poor healing of the incision, possible need for revisional surgery or staged procedures. Thiere can also be persistent swelling, poor wound healing, rippling or loose skin, worsening of cellulite, swelling, and thermal burn or heat injury from ultrasound with the ultrasound-assisted lipoplasty technique. Any change in weight fluctuations can alter the  outcome.    Electronically signed by: Krista Blue, PA-C 01/13/2021 10:14 AM

## 2021-01-13 ENCOUNTER — Ambulatory Visit (INDEPENDENT_AMBULATORY_CARE_PROVIDER_SITE_OTHER): Payer: BC Managed Care – PPO | Admitting: Physician Assistant

## 2021-01-13 ENCOUNTER — Other Ambulatory Visit: Payer: Self-pay

## 2021-01-13 ENCOUNTER — Encounter: Payer: Self-pay | Admitting: Physician Assistant

## 2021-01-13 VITALS — BP 122/75 | HR 78 | Ht 67.0 in | Wt 278.8 lb

## 2021-01-13 DIAGNOSIS — Z9889 Other specified postprocedural states: Secondary | ICD-10-CM

## 2021-01-13 MED ORDER — CEPHALEXIN 500 MG PO CAPS: 500.0000 mg | ORAL_CAPSULE | Freq: Four times a day (QID) | ORAL | 0 refills | Status: AC

## 2021-01-13 MED ORDER — ONDANSETRON 4 MG PO TBDP: 4.0000 mg | ORAL_TABLET | Freq: Three times a day (TID) | ORAL | 0 refills | Status: DC | PRN

## 2021-01-13 MED ORDER — HYDROCODONE-ACETAMINOPHEN 5-325 MG PO TABS: 1.0000 | ORAL_TABLET | Freq: Four times a day (QID) | ORAL | 0 refills | Status: AC | PRN

## 2021-01-14 DIAGNOSIS — B9689 Other specified bacterial agents as the cause of diseases classified elsewhere: Secondary | ICD-10-CM | POA: Diagnosis not present

## 2021-01-14 DIAGNOSIS — J019 Acute sinusitis, unspecified: Secondary | ICD-10-CM | POA: Diagnosis not present

## 2021-01-14 DIAGNOSIS — J040 Acute laryngitis: Secondary | ICD-10-CM | POA: Diagnosis not present

## 2021-01-21 ENCOUNTER — Encounter (HOSPITAL_BASED_OUTPATIENT_CLINIC_OR_DEPARTMENT_OTHER): Payer: Self-pay | Admitting: Plastic Surgery

## 2021-01-21 ENCOUNTER — Other Ambulatory Visit: Payer: Self-pay

## 2021-01-28 ENCOUNTER — Encounter (HOSPITAL_BASED_OUTPATIENT_CLINIC_OR_DEPARTMENT_OTHER): Payer: Self-pay | Admitting: Plastic Surgery

## 2021-01-28 ENCOUNTER — Encounter: Payer: Self-pay | Admitting: Plastic Surgery

## 2021-01-28 NOTE — Anesthesia Preprocedure Evaluation (Addendum)
Anesthesia Evaluation  Patient identified by MRN, date of birth, ID band Patient awake    Reviewed: Allergy & Precautions, NPO status , Patient's Chart, lab work & pertinent test results  History of Anesthesia Complications (+) PONV and history of anesthetic complications  Airway Mallampati: II  TM Distance: >3 FB Neck ROM: Full    Dental no notable dental hx. (+) Teeth Intact, Dental Advisory Given   Pulmonary asthma , former smoker,    Pulmonary exam normal breath sounds clear to auscultation       Cardiovascular negative cardio ROS Normal cardiovascular exam Rhythm:Regular Rate:Normal     Neuro/Psych PSYCHIATRIC DISORDERS Anxiety Depression  Neuromuscular disease    GI/Hepatic negative GI ROS, Neg liver ROS,   Endo/Other  Morbid obesityPrediabetes  Renal/GU negative Renal ROS  negative genitourinary   Musculoskeletal  (+) Arthritis , Osteoarthritis,  Fibromyalgia -  Abdominal (+) + obese,   Peds  Hematology  (+) anemia , Bilateral mammary hypertrophy   Anesthesia Other Findings   Reproductive/Obstetrics                            Anesthesia Physical Anesthesia Plan  ASA: 3  Anesthesia Plan: General   Post-op Pain Management: Dilaudid IV, Gabapentin PO (pre-op), Celebrex PO (pre-op) and Tylenol PO (pre-op)   Induction: Intravenous  PONV Risk Score and Plan: 4 or greater and Treatment may vary due to age or medical condition, Scopolamine patch - Pre-op, Midazolam, Ondansetron and Dexamethasone  Airway Management Planned: Oral ETT  Additional Equipment:   Intra-op Plan:   Post-operative Plan: Extubation in OR  Informed Consent: I have reviewed the patients History and Physical, chart, labs and discussed the procedure including the risks, benefits and alternatives for the proposed anesthesia with the patient or authorized representative who has indicated his/her understanding  and acceptance.     Dental advisory given  Plan Discussed with: CRNA and Anesthesiologist  Anesthesia Plan Comments:        Anesthesia Quick Evaluation

## 2021-01-29 ENCOUNTER — Encounter (HOSPITAL_BASED_OUTPATIENT_CLINIC_OR_DEPARTMENT_OTHER)
Admission: RE | Disposition: A | Discharge status: Home / Self Care | Payer: Self-pay | Source: Home / Self Care | Attending: Plastic Surgery

## 2021-01-29 ENCOUNTER — Ambulatory Visit (HOSPITAL_BASED_OUTPATIENT_CLINIC_OR_DEPARTMENT_OTHER)
Admission: RE | Admit: 2021-01-29 | Discharge: 2021-01-29 | Disposition: A | Discharge status: Home / Self Care | Payer: BC Managed Care – PPO | Attending: Plastic Surgery | Admitting: Plastic Surgery

## 2021-01-29 ENCOUNTER — Ambulatory Visit (HOSPITAL_BASED_OUTPATIENT_CLINIC_OR_DEPARTMENT_OTHER): Payer: BC Managed Care – PPO | Admitting: Anesthesiology

## 2021-01-29 ENCOUNTER — Other Ambulatory Visit: Payer: Self-pay

## 2021-01-29 ENCOUNTER — Encounter (HOSPITAL_BASED_OUTPATIENT_CLINIC_OR_DEPARTMENT_OTHER): Payer: Self-pay | Admitting: Plastic Surgery

## 2021-01-29 DIAGNOSIS — L987 Excessive and redundant skin and subcutaneous tissue: Secondary | ICD-10-CM | POA: Diagnosis not present

## 2021-01-29 DIAGNOSIS — J45909 Unspecified asthma, uncomplicated: Secondary | ICD-10-CM | POA: Insufficient documentation

## 2021-01-29 DIAGNOSIS — R7303 Prediabetes: Secondary | ICD-10-CM | POA: Diagnosis not present

## 2021-01-29 DIAGNOSIS — F32A Depression, unspecified: Secondary | ICD-10-CM | POA: Diagnosis not present

## 2021-01-29 DIAGNOSIS — Z87891 Personal history of nicotine dependence: Secondary | ICD-10-CM | POA: Diagnosis not present

## 2021-01-29 DIAGNOSIS — N62 Hypertrophy of breast: Secondary | ICD-10-CM | POA: Insufficient documentation

## 2021-01-29 DIAGNOSIS — G709 Myoneural disorder, unspecified: Secondary | ICD-10-CM | POA: Insufficient documentation

## 2021-01-29 DIAGNOSIS — D649 Anemia, unspecified: Secondary | ICD-10-CM | POA: Diagnosis not present

## 2021-01-29 DIAGNOSIS — M1990 Unspecified osteoarthritis, unspecified site: Secondary | ICD-10-CM | POA: Insufficient documentation

## 2021-01-29 DIAGNOSIS — F419 Anxiety disorder, unspecified: Secondary | ICD-10-CM | POA: Diagnosis not present

## 2021-01-29 DIAGNOSIS — Z79899 Other long term (current) drug therapy: Secondary | ICD-10-CM | POA: Diagnosis not present

## 2021-01-29 DIAGNOSIS — E559 Vitamin D deficiency, unspecified: Secondary | ICD-10-CM | POA: Diagnosis not present

## 2021-01-29 DIAGNOSIS — M797 Fibromyalgia: Secondary | ICD-10-CM | POA: Diagnosis not present

## 2021-01-29 DIAGNOSIS — N3281 Overactive bladder: Secondary | ICD-10-CM | POA: Diagnosis not present

## 2021-01-29 HISTORY — DX: Other specified postprocedural states: Z98.890

## 2021-01-29 HISTORY — DX: Nausea with vomiting, unspecified: R11.2

## 2021-01-29 HISTORY — PX: BREAST CYST EXCISION: SHX579

## 2021-01-29 LAB — GLUCOSE, CAPILLARY
Glucose-Capillary: 102 mg/dL — ABNORMAL HIGH (ref 70–99)
Glucose-Capillary: 123 mg/dL — ABNORMAL HIGH (ref 70–99)

## 2021-01-29 LAB — POCT PREGNANCY, URINE: Preg Test, Ur: NEGATIVE

## 2021-01-29 SURGERY — EXCISION, CYST, BREAST
Anesthesia: General | Site: Breast | Laterality: Bilateral

## 2021-01-29 MED ORDER — SUGAMMADEX SODIUM 200 MG/2ML IV SOLN
INTRAVENOUS | Status: DC | PRN
  Administered 2021-01-29: 200 mg via INTRAVENOUS

## 2021-01-29 MED ORDER — APREPITANT 40 MG PO CAPS
ORAL_CAPSULE | ORAL | Status: AC
  Filled 2021-01-29: qty 1

## 2021-01-29 MED ORDER — SODIUM CHLORIDE 0.9 % IV SOLN: 250.0000 mL | INTRAVENOUS | Status: DC | PRN

## 2021-01-29 MED ORDER — PROPOFOL 10 MG/ML IV BOLUS
INTRAVENOUS | Status: AC
  Filled 2021-01-29: qty 20

## 2021-01-29 MED ORDER — DEXMEDETOMIDINE (PRECEDEX) IN NS 20 MCG/5ML (4 MCG/ML) IV SYRINGE
PREFILLED_SYRINGE | INTRAVENOUS | Status: DC | PRN
  Administered 2021-01-29 (×2): 2 ug via INTRAVENOUS
  Administered 2021-01-29: 4 ug via INTRAVENOUS
  Administered 2021-01-29 (×2): 2 ug via INTRAVENOUS

## 2021-01-29 MED ORDER — APREPITANT 40 MG PO CAPS
40.0000 mg | ORAL_CAPSULE | Freq: Once | ORAL | Status: AC
  Administered 2021-01-29: 40 mg via ORAL

## 2021-01-29 MED ORDER — DROPERIDOL 2.5 MG/ML IJ SOLN
INTRAMUSCULAR | Status: AC
  Filled 2021-01-29: qty 2

## 2021-01-29 MED ORDER — SODIUM CHLORIDE 0.9% FLUSH: 3.0000 mL | Freq: Two times a day (BID) | INTRAVENOUS | Status: DC

## 2021-01-29 MED ORDER — DROPERIDOL 2.5 MG/ML IJ SOLN
0.6250 mg | Freq: Once | INTRAMUSCULAR | Status: AC
  Administered 2021-01-29: 0.625 mg via INTRAVENOUS

## 2021-01-29 MED ORDER — LIDOCAINE 2% (20 MG/ML) 5 ML SYRINGE
INTRAMUSCULAR | Status: AC
  Filled 2021-01-29: qty 5

## 2021-01-29 MED ORDER — FENTANYL CITRATE (PF) 100 MCG/2ML IJ SOLN
INTRAMUSCULAR | Status: AC
  Filled 2021-01-29: qty 2

## 2021-01-29 MED ORDER — DEXAMETHASONE SODIUM PHOSPHATE 10 MG/ML IJ SOLN
INTRAMUSCULAR | Status: DC | PRN
  Administered 2021-01-29: 10 mg via INTRAVENOUS

## 2021-01-29 MED ORDER — OXYCODONE HCL 5 MG/5ML PO SOLN: 5.0000 mg | Freq: Once | ORAL | Status: AC | PRN

## 2021-01-29 MED ORDER — ONDANSETRON HCL 4 MG/2ML IJ SOLN
INTRAMUSCULAR | Status: DC | PRN
  Administered 2021-01-29: 4 mg via INTRAVENOUS
  Administered 2021-01-29: 10 mg via INTRAVENOUS

## 2021-01-29 MED ORDER — OXYCODONE HCL 5 MG PO TABS
ORAL_TABLET | ORAL | Status: AC
  Filled 2021-01-29: qty 1

## 2021-01-29 MED ORDER — MIDAZOLAM HCL 5 MG/5ML IJ SOLN
INTRAMUSCULAR | Status: DC | PRN
  Administered 2021-01-29 (×2): 1 mg via INTRAVENOUS

## 2021-01-29 MED ORDER — CEFAZOLIN IN SODIUM CHLORIDE 3-0.9 GM/100ML-% IV SOLN
INTRAVENOUS | Status: AC
  Filled 2021-01-29: qty 100

## 2021-01-29 MED ORDER — ROCURONIUM BROMIDE 10 MG/ML (PF) SYRINGE
PREFILLED_SYRINGE | INTRAVENOUS | Status: AC
  Filled 2021-01-29: qty 10

## 2021-01-29 MED ORDER — ONDANSETRON HCL 4 MG/2ML IJ SOLN
4.0000 mg | Freq: Once | INTRAMUSCULAR | Status: AC | PRN
  Administered 2021-01-29: 4 mg via INTRAVENOUS

## 2021-01-29 MED ORDER — DEXAMETHASONE SODIUM PHOSPHATE 10 MG/ML IJ SOLN
INTRAMUSCULAR | Status: AC
  Filled 2021-01-29: qty 1

## 2021-01-29 MED ORDER — ONDANSETRON HCL 4 MG/2ML IJ SOLN
INTRAMUSCULAR | Status: AC
  Filled 2021-01-29: qty 2

## 2021-01-29 MED ORDER — ACETAMINOPHEN 325 MG RE SUPP: 650.0000 mg | RECTAL | Status: DC | PRN

## 2021-01-29 MED ORDER — MIDAZOLAM HCL 2 MG/2ML IJ SOLN
INTRAMUSCULAR | Status: AC
  Filled 2021-01-29: qty 2

## 2021-01-29 MED ORDER — ACETAMINOPHEN 10 MG/ML IV SOLN
INTRAVENOUS | Status: AC
  Filled 2021-01-29: qty 100

## 2021-01-29 MED ORDER — HYDROMORPHONE HCL 1 MG/ML IJ SOLN
0.2500 mg | INTRAMUSCULAR | Status: DC | PRN
  Administered 2021-01-29 (×3): 0.5 mg via INTRAVENOUS

## 2021-01-29 MED ORDER — PROPOFOL 10 MG/ML IV BOLUS
INTRAVENOUS | Status: DC | PRN
  Administered 2021-01-29: 200 mg via INTRAVENOUS

## 2021-01-29 MED ORDER — LIDOCAINE-EPINEPHRINE 1 %-1:100000 IJ SOLN
INTRAMUSCULAR | Status: DC | PRN
  Administered 2021-01-29: 20 mL via INTRAMUSCULAR

## 2021-01-29 MED ORDER — HYDROMORPHONE HCL 1 MG/ML IJ SOLN
INTRAMUSCULAR | Status: AC
  Filled 2021-01-29: qty 0.5

## 2021-01-29 MED ORDER — OXYCODONE HCL 5 MG PO TABS
5.0000 mg | ORAL_TABLET | Freq: Once | ORAL | Status: AC | PRN
  Administered 2021-01-29: 5 mg via ORAL

## 2021-01-29 MED ORDER — HYDRALAZINE HCL 20 MG/ML IJ SOLN
INTRAMUSCULAR | Status: AC
  Filled 2021-01-29: qty 1

## 2021-01-29 MED ORDER — CEFAZOLIN IN SODIUM CHLORIDE 3-0.9 GM/100ML-% IV SOLN
3.0000 g | INTRAVENOUS | Status: AC
  Administered 2021-01-29: 3 g via INTRAVENOUS

## 2021-01-29 MED ORDER — SCOPOLAMINE 1 MG/3DAYS TD PT72
1.0000 | MEDICATED_PATCH | TRANSDERMAL | Status: DC
  Administered 2021-01-29: 1.5 mg via TRANSDERMAL

## 2021-01-29 MED ORDER — CHLORHEXIDINE GLUCONATE CLOTH 2 % EX PADS: 6.0000 | MEDICATED_PAD | Freq: Once | CUTANEOUS | Status: DC

## 2021-01-29 MED ORDER — ACETAMINOPHEN 325 MG PO TABS: 650.0000 mg | ORAL_TABLET | ORAL | Status: DC | PRN

## 2021-01-29 MED ORDER — DEXMEDETOMIDINE (PRECEDEX) IN NS 20 MCG/5ML (4 MCG/ML) IV SYRINGE
PREFILLED_SYRINGE | INTRAVENOUS | Status: AC
  Filled 2021-01-29: qty 5

## 2021-01-29 MED ORDER — ROCURONIUM BROMIDE 100 MG/10ML IV SOLN
INTRAVENOUS | Status: DC | PRN
  Administered 2021-01-29: 60 mg via INTRAVENOUS
  Administered 2021-01-29: 10 mg via INTRAVENOUS

## 2021-01-29 MED ORDER — SODIUM BICARBONATE 4.2 % IV SOLN
INTRAVENOUS | Status: DC | PRN
  Administered 2021-01-29: 700 mL via INTRAMUSCULAR

## 2021-01-29 MED ORDER — HYDRALAZINE HCL 20 MG/ML IJ SOLN
INTRAMUSCULAR | Status: DC | PRN
  Administered 2021-01-29: 5 mg via INTRAVENOUS

## 2021-01-29 MED ORDER — LIDOCAINE HCL (CARDIAC) PF 100 MG/5ML IV SOSY
PREFILLED_SYRINGE | INTRAVENOUS | Status: DC | PRN
  Administered 2021-01-29: 80 mg via INTRAVENOUS

## 2021-01-29 MED ORDER — LACTATED RINGERS IV SOLN: INTRAVENOUS | Status: DC

## 2021-01-29 MED ORDER — FENTANYL CITRATE (PF) 100 MCG/2ML IJ SOLN
INTRAMUSCULAR | Status: DC | PRN
  Administered 2021-01-29: 25 ug via INTRAVENOUS
  Administered 2021-01-29: 50 ug via INTRAVENOUS
  Administered 2021-01-29: 25 ug via INTRAVENOUS
  Administered 2021-01-29 (×4): 50 ug via INTRAVENOUS

## 2021-01-29 MED ORDER — SODIUM CHLORIDE 0.9% FLUSH: 3.0000 mL | INTRAVENOUS | Status: DC | PRN

## 2021-01-29 MED ORDER — ACETAMINOPHEN 10 MG/ML IV SOLN
1000.0000 mg | Freq: Once | INTRAVENOUS | Status: AC
  Administered 2021-01-29: 1000 mg via INTRAVENOUS

## 2021-01-29 MED ORDER — SCOPOLAMINE 1 MG/3DAYS TD PT72
MEDICATED_PATCH | TRANSDERMAL | Status: AC
  Filled 2021-01-29: qty 1

## 2021-01-29 SURGICAL SUPPLY — 63 items
ADH SKN CLS APL DERMABOND .7 (GAUZE/BANDAGES/DRESSINGS) ×2
APL SKNCLS STERI-STRIP NONHPOA (GAUZE/BANDAGES/DRESSINGS)
BAND INSRT 18 STRL LF DISP RB (MISCELLANEOUS)
BAND RUBBER #18 3X1/16 STRL (MISCELLANEOUS) IMPLANT
BENZOIN TINCTURE PRP APPL 2/3 (GAUZE/BANDAGES/DRESSINGS) IMPLANT
BINDER BREAST XXLRG (GAUZE/BANDAGES/DRESSINGS) ×2 IMPLANT
BLADE CLIPPER SURG (BLADE) IMPLANT
BLADE SURG 10 STRL SS (BLADE) ×2 IMPLANT
BLADE SURG 15 STRL LF DISP TIS (BLADE) ×1 IMPLANT
BLADE SURG 15 STRL SS (BLADE) ×2
BNDG CONFORM 2 STRL LF (GAUZE/BANDAGES/DRESSINGS) IMPLANT
BNDG ELASTIC 2X5.8 VLCR STR LF (GAUZE/BANDAGES/DRESSINGS) IMPLANT
CANISTER SUCT 1200ML W/VALVE (MISCELLANEOUS) ×2 IMPLANT
CLEANER CAUTERY TIP 5X5 PAD (MISCELLANEOUS) IMPLANT
CORD BIPOLAR FORCEPS 12FT (ELECTRODE) IMPLANT
COVER BACK TABLE 60X90IN (DRAPES) ×2 IMPLANT
COVER MAYO STAND STRL (DRAPES) ×2 IMPLANT
DECANTER SPIKE VIAL GLASS SM (MISCELLANEOUS) IMPLANT
DERMABOND ADVANCED (GAUZE/BANDAGES/DRESSINGS) ×2
DERMABOND ADVANCED .7 DNX12 (GAUZE/BANDAGES/DRESSINGS) ×2 IMPLANT
DRAPE LAPAROSCOPIC ABDOMINAL (DRAPES) ×2 IMPLANT
DRAPE LAPAROTOMY 100X72 PEDS (DRAPES) IMPLANT
DRAPE U-SHAPE 76X120 STRL (DRAPES) IMPLANT
DRSG AQUACEL AG ADV 3.5X 6 (GAUZE/BANDAGES/DRESSINGS) IMPLANT
DRSG AQUACEL AG ADV 3.5X10 (GAUZE/BANDAGES/DRESSINGS) ×4 IMPLANT
DRSG TEGADERM 2-3/8X2-3/4 SM (GAUZE/BANDAGES/DRESSINGS) IMPLANT
DRSG TEGADERM 4X4.75 (GAUZE/BANDAGES/DRESSINGS) IMPLANT
ELECT COATED BLADE 2.86 ST (ELECTRODE) IMPLANT
ELECT NEEDLE BLADE 2-5/6 (NEEDLE) IMPLANT
ELECT REM PT RETURN 9FT ADLT (ELECTROSURGICAL) ×2
ELECT REM PT RETURN 9FT PED (ELECTROSURGICAL)
ELECTRODE REM PT RETRN 9FT PED (ELECTROSURGICAL) IMPLANT
ELECTRODE REM PT RTRN 9FT ADLT (ELECTROSURGICAL) ×1 IMPLANT
GAUZE SPONGE 4X4 12PLY STRL LF (GAUZE/BANDAGES/DRESSINGS) IMPLANT
GLOVE SURG ENC MOIS LTX SZ6.5 (GLOVE) ×6 IMPLANT
GLOVE SURG POLYISO LF SZ6.5 (GLOVE) ×4 IMPLANT
GLOVE SURG UNDER POLY LF SZ6.5 (GLOVE) ×4 IMPLANT
GLOVE SURG UNDER POLY LF SZ7.5 (GLOVE) ×4 IMPLANT
GOWN STRL REUS W/ TWL LRG LVL3 (GOWN DISPOSABLE) ×4 IMPLANT
GOWN STRL REUS W/TWL LRG LVL3 (GOWN DISPOSABLE) ×8
NEEDLE HYPO 27GX1-1/4 (NEEDLE) ×2 IMPLANT
NEEDLE HYPO 30GX1 BEV (NEEDLE) IMPLANT
NS IRRIG 1000ML POUR BTL (IV SOLUTION) ×2 IMPLANT
PACK BASIN DAY SURGERY FS (CUSTOM PROCEDURE TRAY) ×2 IMPLANT
PAD CLEANER CAUTERY TIP 5X5 (MISCELLANEOUS)
PENCIL SMOKE EVACUATOR (MISCELLANEOUS) ×2 IMPLANT
SHEET MEDIUM DRAPE 40X70 STRL (DRAPES) IMPLANT
SLEEVE SCD COMPRESS KNEE MED (STOCKING) ×2 IMPLANT
SPONGE GAUZE 2X2 8PLY STRL LF (GAUZE/BANDAGES/DRESSINGS) IMPLANT
SPONGE T-LAP 18X18 ~~LOC~~+RFID (SPONGE) ×4 IMPLANT
STRIP CLOSURE SKIN 1/2X4 (GAUZE/BANDAGES/DRESSINGS) ×4 IMPLANT
SUCTION FRAZIER HANDLE 10FR (MISCELLANEOUS)
SUCTION TUBE FRAZIER 10FR DISP (MISCELLANEOUS) IMPLANT
SUT MNCRL AB 3-0 PS2 18 (SUTURE) ×8 IMPLANT
SUT MNCRL AB 4-0 PS2 18 (SUTURE) ×10 IMPLANT
SUT MON AB 5-0 PS2 18 (SUTURE) ×2 IMPLANT
SYR BULB EAR ULCER 3OZ GRN STR (SYRINGE) ×2 IMPLANT
SYR CONTROL 10ML LL (SYRINGE) ×2 IMPLANT
TOWEL GREEN STERILE FF (TOWEL DISPOSABLE) ×4 IMPLANT
TRAY DSU PREP LF (CUSTOM PROCEDURE TRAY) ×2 IMPLANT
TUBE CONNECTING 20X1/4 (TUBING) ×2 IMPLANT
TUBING INFILTRATION IT-10001 (TUBING) ×2 IMPLANT
TUBING SET GRADUATE ASPIR 12FT (MISCELLANEOUS) ×2 IMPLANT

## 2021-01-29 NOTE — Interval H&P Note (Signed)
History and Physical Interval Note:  01/29/2021 7:04 AM  Kimberly Mckay  has presented today for surgery, with the diagnosis of mammary hypertrophy.  The various methods of treatment have been discussed with the patient and family. After consideration of risks, benefits and other options for treatment, the patient has consented to  Procedure(s) with comments: Excision of accessory axillary tissue (Bilateral) - 1.5 hours as a surgical intervention.  The patient's history has been reviewed, patient examined, no change in status, stable for surgery.  I have reviewed the patient's chart and labs.  Questions were answered to the patient's satisfaction.     [name] Krista Som

## 2021-01-29 NOTE — Transfer of Care (Signed)
Immediate Anesthesia Transfer of Care Note  Patient: Kimberly Mckay  Procedure(s) Performed: Excision of accessory axillary tissue (Bilateral: Breast)  Patient Location: PACU  Anesthesia Type:General  Level of Consciousness: awake, alert , oriented and patient cooperative  Airway & Oxygen Therapy: Patient Spontanous Breathing and Patient connected to face mask oxygen  Post-op Assessment: Report given to RN and Post -op Vital signs reviewed and stable  Post vital signs: Reviewed and stable  Last Vitals:  Vitals Value Taken Time  BP 100/89 01/29/21 1004  Temp    Pulse 70 01/29/21 1009  Resp 11 01/29/21 1009  SpO2 100 % 01/29/21 1009  Vitals shown include unvalidated device data.  Last Pain:  Vitals:   01/29/21 0644  TempSrc: Oral  PainSc: 0-No pain         Complications: No notable events documented.

## 2021-01-29 NOTE — Anesthesia Procedure Notes (Signed)
Procedure Name: Intubation Date/Time: 01/29/2021 7:47 AM Performed by: Garth Bigness, CRNA Pre-anesthesia Checklist: Patient identified, Emergency Drugs available, Suction available and Patient being monitored Patient Re-evaluated:Patient Re-evaluated prior to induction Oxygen Delivery Method: Circle system utilized Preoxygenation: Pre-oxygenation with 100% oxygen Induction Type: IV induction Ventilation: Mask ventilation without difficulty Laryngoscope Size: Glidescope and 3 Grade View: Grade I Tube type: Oral Tube size: 7.0 mm Number of attempts: 1 Airway Equipment and Method: Oral airway, Video-laryngoscopy and Stylet Placement Confirmation: ETT inserted through vocal cords under direct vision, positive ETCO2 and breath sounds checked- equal and bilateral Secured at: 22 cm Tube secured with: Tape Dental Injury: Teeth and Oropharynx as per pre-operative assessment

## 2021-01-29 NOTE — Discharge Instructions (Addendum)
INSTRUCTIONS FOR AFTER SURGERY   You will likely have some questions about what to expect following your operation.  The following information will help you and your family understand what to expect when you are discharged from the hospital.  Following these guidelines will help ensure a smooth recovery and reduce risks of complications.  Postoperative instructions include information on: diet, wound care, medications and physical activity.  AFTER SURGERY Expect to go home after the procedure.  In some cases, you may need to spend one night in the hospital for observation.  DIET This surgery does not require a specific diet.  However, I have to mention that the healthier you eat the better your body can start healing. It is important to increasing your protein intake.  This means limiting the foods with added sugar.  Focus on fruits and vegetables and some meat. It is very important to drink water after your surgery.  If your urine is bright yellow, then it is concentrated, and you need to drink more water.  As a general rule after surgery, you should have 8 ounces of water every hour while awake.  If you find you are persistently nauseated or unable to take in liquids let us know.  NO TOBACCO USE or EXPOSURE.  This will slow your healing process and increase the risk of a wound.  WOUND CARE If you don't have a drain: You can shower the day after surgery.  Use fragrance free soap.  Dial, [place], [place] and Cetaphil are usually mild on the skin.  If you have a drain: Clean with baby wipes for 3-5 days and then you can shower.  If you have a binder you may remove it to shower and then put it back on.  You may have Topifoam or Lipofoam on.  It is soft and spongy and helps keep you from getting creases if you have liposuction.  This can be removed before the shower and then replaced.  If you need more it is available on [place] as lipofoam.  If you have steri-strips / tape directly attached to your skin  leave them in place. It is OK to get these wet.  No baths, pools or hot tubs for two weeks. We close your incision to leave the smallest and best-looking scar. No ointment or creams on your incisions until given the go ahead.  Especially not Neosporin (Too many skin reactions with this one).  A few weeks after surgery you can use Mederma and start massaging the scar. We ask you to wear your binder or sports bra for the first 6 weeks around the clock, including while sleeping. This provides added comfort and helps reduce the fluid accumulation at the surgery site.  ACTIVITY No heavy lifting until cleared by the doctor.  It is OK to walk and climb stairs. In fact, moving your legs is very important to decrease your risk of a blood clot.  It will also help keep you from getting deconditioned.  Every 1 to 2 hours get up and walk for 5 minutes. This will help with a quicker recovery back to normal.  Let pain be your guide so you don't do too much.  NO, you cannot do the spring cleaning and don't plan on taking care of anyone else.  This is your time for TLC.   WORK Everyone returns to work at different times. As a rough guide, most people take at least 1 - 2 weeks off prior to returning to work. If  you need documentation for your job, bring the forms to your postoperative follow up visit.  DRIVING Arrange for someone to bring you home from the hospital.  You may be able to drive a few days after surgery but not while taking any narcotics or valium.  BOWEL MOVEMENTS Constipation can occur after anesthesia and while taking pain medication.  It is important to stay ahead for your comfort.  We recommend taking Milk of Magnesia (2 tablespoons; twice a day) while taking the pain pills.  SEROMA This is fluid your body tried to put in the surgical site.  This is normal but if it creates excessive pain and swelling let us know.  It usually decreases in a few weeks.  MEDICATIONS and PAIN CONTROL At your  preoperative visit for you history and physical you were given the following medications: An antibiotic: Start this medication when you get home and take according to the instructions on the bottle. Zofran 4 mg:  This is to treat nausea and vomiting.  You can take this every 6 hours as needed and only if needed. Norco (hydrocodone/acetaminophen) 5/325 mg:  This is only to be used after you have taken the motrin or the tylenol. Every 8 hours as needed. Over the counter Medication to take: Ibuprofen (Motrin) 600 mg:  Take this every 6 hours.  If you have additional pain then take 500 mg of the tylenol.  Only take the Norco after you have tried these two. Miralax or stool softener of choice: Take this according to the bottle if you take the Norco.  WHEN TO CALL Call your surgeon's office if any of the following occur:  Fever 101 degrees F or greater  Excessive bleeding or fluid from the incision site.  Pain that increases over time without aid from the medications  Redness, warmth, or pus draining from incision sites  Persistent nausea or inability to take in liquids  Severe misshapen area that underwent the operation.   Post Anesthesia Home Care Instructions  Activity: Get plenty of rest for the remainder of the day. A responsible individual must stay with you for 24 hours following the procedure.  For the next 24 hours, DO NOT: -Drive a car -Advertising copywriter -Drink alcoholic beverages -Take any medication unless instructed by your physician -Make any legal decisions or sign important papers.  Meals: Start with liquid foods such as gelatin or soup. Progress to regular foods as tolerated. Avoid greasy, spicy, heavy foods. If nausea and/or vomiting occur, drink only clear liquids until the nausea and/or vomiting subsides. Call your physician if vomiting continues.  Special Instructions/Symptoms: Your throat may feel dry or sore from the anesthesia or the breathing tube placed in your  throat during surgery. If this causes discomfort, gargle with warm salt water. The discomfort should disappear within 24 hours.  If you had a scopolamine patch placed behind your ear for the management of post- operative nausea and/or vomiting:  1. The medication in the patch is effective for 72 hours, after which it should be removed.  Wrap patch in a tissue and discard in the trash. Wash hands thoroughly with soap and water. 2. You may remove the patch earlier than 72 hours if you experience unpleasant side effects which may include dry mouth, dizziness or visual disturbances. 3. Avoid touching the patch. Wash your hands with soap and water after contact with the patch.  Next dose of Tylenol can be taken today at 5pm today if needed.

## 2021-01-29 NOTE — Op Note (Signed)
DATE OF PROCEDURE: 01/29/2021  LOCATION: [facility]  SURGEON: [facility] Grant Swager, DO  ASSISTANT: Evelena Leyden, PA  PREOPERATIVE DIAGNOSIS 1. Excess axillary breast tissue 2. H/O Macromastia with Neck and Back Pain  POSTOPERATIVE DIAGNOSIS Same as preoperative diagnosis  PROCEDURES 1. Bilateral excision of axillary excess breat tissue (skin, soft tissue and fat).  Right reduction 500 g and 4 x 10 cm, Left reduction 550 g and 4 x 10 cm  COMPLICATIONS: None.  DRAINS: none  INDICATIONS FOR PROCEDURE Kimberly Mckay is a 40 y.o. year-old female born on 15-Jul-1980,with a history of symptomatic macromastia with concominant back pain, neck pain, shoulder grooving from her bra.  She underwent a bilateral breast reduction.  For safety we did not include the axilla in the last procedure.  She is here today for excision. MRN: [id number]  CONSENT Informed consent was obtained directly from the patient. The risks, benefits and alternatives were fully discussed. Specific risks including but not limited to bleeding, infection, hematoma, seroma, scarring, pain, nipple necrosis, asymmetry, poor cosmetic results, and need for further surgery were discussed. The patient had ample opportunity to have her questions answered to her satisfaction.  DESCRIPTION OF PROCEDURE  Patient was brought into the operating room and placed in a supine position.  SCDs were placed and appropriate padding was performed.  Antibiotics were given. The patient underwent general anesthesia and the chest was prepped and draped in a sterile fashion.  A timeout was performed and all information was confirmed to be correct.  Right side: Preoperative markings were confirmed.  Incision lines were injected with local with epinephrine.  After waiting for vasoconstriction, the marked lines were incised.  The patient had severe skin thinning and breakdown at the lateral superior aspect of the breast  adjacent to the axilla.   This was utilized for the incision site.  First the tumescent was placed with 200 cc.  Liposuction was then done.  The excess skin was then excised and was 4 x 10 cm which included skin, soft tissue and fat.  Hemostasis was achieved.  The deep tissues were approximated with 3-0 Monocryl sutures and the skin was closed with deep dermal and subcuticular 4-0 Monocryl sutures.    Left side: Preoperative markings were confirmed.  Incision lines were injected with local with epinephrine.  After waiting for vasoconstriction, the marked lines were incised.  The patient had severe skin thinning and breakdown at the lateral superior aspect of the breast adjacent to the axilla.   This was utilized for the incision site.  First the tumescent was placed with 200 cc.  Liposuction was then done.  The excess skin was then excised and was 4 x 10 cm which inclused skin, soft tissue and fat.  Hemostasis was achieved.  The deep tissues were approximated with 3-0 Monocryl sutures and the skin was closed with deep dermal and subcuticular 4-0 Monocryl sutures.   Dermabond was applied.  A breast binder and ABDs were placed.  The skin flaps had good capillary refill at the end of the procedure.  The patient tolerated the procedure well. The patient was allowed to wake from anesthesia and taken to the recovery room in satisfactory condition.  The advanced practice practitioner (APP) assisted throughout the case.  The APP was essential in retraction and counter traction when needed to make the case progress smoothly.  This retraction and assistance made it possible to see the tissue plans for the procedure.  The assistance was needed for  blood control, tissue re-approximation and assisted with closure of the incision site.

## 2021-01-29 NOTE — Anesthesia Postprocedure Evaluation (Signed)
Anesthesia Post Note  Patient: Kimberly Mckay  Procedure(s) Performed: Excision of accessory axillary tissue (Bilateral: Breast)     Patient location during evaluation: PACU Anesthesia Type: General Level of consciousness: awake and alert and oriented Pain management: pain level controlled Vital Signs Assessment: post-procedure vital signs reviewed and stable Respiratory status: spontaneous breathing, nonlabored ventilation and respiratory function stable Cardiovascular status: blood pressure returned to baseline and stable Postop Assessment: no apparent nausea or vomiting Anesthetic complications: no   No notable events documented.  Last Vitals:  Vitals:   01/29/21 1045 01/29/21 1100  BP: 115/76   Pulse: 62 67  Resp: 12 11  Temp:    SpO2: 96% 96%    Last Pain:  Vitals:   01/29/21 1045  TempSrc:   PainSc: 10-Worst pain ever                 Lashonna Rieke A.

## 2021-01-30 ENCOUNTER — Telehealth: Payer: Self-pay | Admitting: Plastic Surgery

## 2021-01-30 ENCOUNTER — Encounter: Payer: Self-pay | Admitting: Plastic Surgery

## 2021-01-30 ENCOUNTER — Encounter (HOSPITAL_BASED_OUTPATIENT_CLINIC_OR_DEPARTMENT_OTHER): Payer: Self-pay | Admitting: Plastic Surgery

## 2021-01-30 LAB — SURGICAL PATHOLOGY

## 2021-01-30 NOTE — Telephone Encounter (Signed)
Patient had surgery with Dr. Ulice Bold 12/8 and they had missed the follow up call and she said that "she was so loopy yesterday, she didn't remember anything the nurse told her".   If someone could give her a call and answer a few questions she has and explain the after surgery care to her.  Please follow up with patient. Thank You

## 2021-02-02 NOTE — Telephone Encounter (Signed)
Returned patients call. She, nor her husband did not speak with Dr. Ulice Bold after surgery to see how it went. During conversation, she advised me she had only brown tape for dressings and a binder with no drains. I informed she is ok to take a shower, but no direct force from water to the incision area. Keep binder on 24/7 other when taking showers. First PO follow up is 02/06/2021. Provided message to Dr. Ulice Bold to follow up with patients to discuss her recent surgery.

## 2021-02-03 ENCOUNTER — Encounter: Payer: Self-pay | Admitting: Plastic Surgery

## 2021-02-06 ENCOUNTER — Ambulatory Visit (INDEPENDENT_AMBULATORY_CARE_PROVIDER_SITE_OTHER): Payer: BC Managed Care – PPO | Admitting: Plastic Surgery

## 2021-02-06 ENCOUNTER — Other Ambulatory Visit: Payer: Self-pay

## 2021-02-06 ENCOUNTER — Encounter: Payer: Self-pay | Admitting: Plastic Surgery

## 2021-02-06 DIAGNOSIS — N62 Hypertrophy of breast: Secondary | ICD-10-CM

## 2021-02-06 NOTE — Progress Notes (Signed)
The patient is a 41 year old female here for follow-up on her surgery.  She had resection of excess axillary tissue 1 week ago.  She is very pleased with her results so far.  Her dressing is still in place.  No sign of seroma or hematoma.  She has bruising as expected.  Continue with a sports bra or binder.  Follow-up in 1 week.  I told her how to remove the dressing if needed.

## 2021-02-09 NOTE — Progress Notes (Signed)
Patient is a 40 year old female with past medical history of previous breast reduction surgery 09/03/2020 who had subsequent excision of excess axillary tissue performed 01/29/2021 by Dr. Ulice Bold who presents to clinic for postoperative follow-up.  She was seen most recently on 02/06/2021 at which point she was doing well.  Dressings remained in place and there is no evidence of seroma or hematoma.  Plan was for continued compressive garment and activity modifications.  Today, patient states that she did not anticipate to be in this much pain.  She is to the left Mepilex border dressing started to fall off in the shower and removal was quite uncomfortable.  She also states that there is a very small area of flesh that she can see in left incisional repair.  She does not think that it looks good, but does understand that it is still early in her recovery.  She also does acknowledge that there was a considerable amount of axillary tissue to be removed.  She tried switching to a compressive bra, but was not having good success and so she has continued to wear the breast binder.  We will refer her to [facility] specialty bra clinic to see if they will be able to assist her.  Physical exam is overall reassuring.  Right axillary region with Mepilex border still firmly in place.  Discussed removal versus leaving in place, she would prefer to leave it in place.  There is no surrounding erythema to suggest contact dermatitis.  Left axillary region without any areas of dehiscence.  There was a very small area at which there was eversion at time of incision repair during her incision repair.  Do not feel as though the fleshy extrusion should be cauterized, less than 0.2 cm.  The incision looks like it is healing quite nicely.  It is mildly dry, recommending daily Vaseline followed by ABD pad and secured with her binder versus compressive bra.  No redness or subcutaneous fluid collections noted.  Continue with  Tylenol and Norco as needed for pain control.  She will return to clinic in the next 7 to 10 days for ongoing evaluation and management.

## 2021-02-13 ENCOUNTER — Other Ambulatory Visit: Payer: Self-pay

## 2021-02-13 ENCOUNTER — Ambulatory Visit (INDEPENDENT_AMBULATORY_CARE_PROVIDER_SITE_OTHER): Payer: BC Managed Care – PPO | Admitting: Physician Assistant

## 2021-02-13 DIAGNOSIS — Z9889 Other specified postprocedural states: Secondary | ICD-10-CM

## 2021-02-17 DIAGNOSIS — Z9013 Acquired absence of bilateral breasts and nipples: Secondary | ICD-10-CM | POA: Diagnosis not present

## 2021-02-19 ENCOUNTER — Encounter: Payer: BC Managed Care – PPO | Admitting: Surgical

## 2021-02-20 ENCOUNTER — Encounter: Payer: BC Managed Care – PPO | Admitting: Surgical

## 2021-02-20 ENCOUNTER — Encounter: Payer: BC Managed Care – PPO | Admitting: Physician Assistant

## 2021-02-24 NOTE — Progress Notes (Signed)
Patient is a 41 year old female with past medical history of previous breast reduction surgery 09/03/2020 who had subsequent excision of excess axillary tissue performed 01/29/2021 by Dr. Ulice Bold who presents to clinic for postoperative follow-up.  She was seen most recently here in clinic on 02/13/2021.  At that time, she complained of postoperative discomfort and was concerned about the cosmetic outcome, but was understanding that she is still very early in her recovery.  Dressings were removed without complication or difficulty.  Physical exam was reassuring.  She was referred to [facility] for specialty bra.  Today, patient is doing okay.  She states that she received a couple of compression bras from [facility] [place] which have been incredibly comfortable and helpful with her postoperative recovery.  She has however developed mild yeast infections under her breasts bilaterally, but is treating with nystatin powder.  She denies any rashes in axillary regions where most recent surgery was performed.  The incisions are healing well, no complaints in terms of drainage or redness/pain.  She states that it is still "numb", but understands that it might take a few months if not longer for her sensation to return, if at all.  She states that she is very pleased with the right side, but feels as though the left side looks much better.  Physical exam is largely reassuring.  She does have 1-2 areas of hypergranulation/ridging over left axillary region incision.  No wounds or obvious drainage.  No areas concerning for cellulitis.  No obvious subcutaneous fluid collection.  Recommend continued compression and activity modifications.  Return in a couple of weeks for final postop visit and to discuss scar mitigation.  She understands that she will need to get through this recovery phase and allow for settling before we will discuss potential additional intervention for left axillary region.  Picture(s) obtained  of the patient and placed in the chart were with the patient's or guardian's permission.

## 2021-02-25 ENCOUNTER — Ambulatory Visit (INDEPENDENT_AMBULATORY_CARE_PROVIDER_SITE_OTHER): Payer: BC Managed Care – PPO | Admitting: Physician Assistant

## 2021-02-25 ENCOUNTER — Other Ambulatory Visit: Payer: Self-pay

## 2021-02-25 ENCOUNTER — Telehealth: Payer: Self-pay

## 2021-02-25 DIAGNOSIS — Z9889 Other specified postprocedural states: Secondary | ICD-10-CM

## 2021-02-25 NOTE — Telephone Encounter (Signed)
Faxed signed order to [facility] [employer]

## 2021-03-04 ENCOUNTER — Telehealth: Payer: Self-pay | Admitting: *Deleted

## 2021-03-04 NOTE — Telephone Encounter (Signed)
Received on (02/17/21) via of fax from [facility] [place] [facility] Standard Written Order.  Requesting signature and return.  Given to provider to sign.    Order signed and faxed back to [facility] [place].  Confirmation received and copy scanned into the chart.//AB/CMA

## 2021-03-10 NOTE — Progress Notes (Addendum)
Patient is a 41 year old female with past medical history of previous breast reduction surgery 09/03/2020 who had subsequent excision of excess axillary tissue performed 01/29/2021 by Dr. Ulice Bold who presents to clinic for postoperative follow-up.  Patient was last seen here in clinic on 02/25/2021.  At that time, she was treating mild yeast infections in her breast bilaterally with nystatin powder.  Incisions were healing well, physical exam was reassuring.  There were a couple areas of hypergranulation/ridging over left axillary region incision.  No wounds or drainage.  Recommended continued compression and activity modifications.  Return in couple of weeks for likely final postoperative visit.  At that time, could discuss scar mitigation.    Today, patient states that she is doing okay.  She is still dissatisfied from a cosmetic outcome standpoint, but does endorse that her back and neck pain has resolved since her breast reduction surgery.  She still continues to experience rashes in between her breast, but states that the rashes underneath her breasts have subsided.  I do notice a red rash on exam, she suspects it may be related to her compression bra.  She will discontinue to see if that improves.  She continues to apply nystatin without any considerable improvement.  Patient just feels as though her breasts were not lifted is much as she anticipated.    Her scars are healed nicely.  No wounds appreciated.  At this point, she can proceed with her Skinuva scar mitigation cream twice a day for 3 to 4 months.  She understands that she should avoid complete submersion in hot tubs or pools for at least another couple of months.  Otherwise, no activity restrictions or compression bra as needed from postsurgical standpoint.  She plans to follow-up with Dr. Ulice Bold for consult in a couple of months.  Pictures obtained at last visit.

## 2021-03-11 ENCOUNTER — Ambulatory Visit (INDEPENDENT_AMBULATORY_CARE_PROVIDER_SITE_OTHER): Payer: BC Managed Care – PPO | Admitting: Physician Assistant

## 2021-03-11 ENCOUNTER — Other Ambulatory Visit: Payer: Self-pay

## 2021-03-11 ENCOUNTER — Encounter: Payer: Self-pay | Admitting: Physician Assistant

## 2021-03-11 DIAGNOSIS — Z9889 Other specified postprocedural states: Secondary | ICD-10-CM

## 2021-05-12 ENCOUNTER — Other Ambulatory Visit: Payer: Self-pay

## 2021-05-12 ENCOUNTER — Ambulatory Visit (INDEPENDENT_AMBULATORY_CARE_PROVIDER_SITE_OTHER): Payer: BC Managed Care – PPO | Admitting: Plastic Surgery

## 2021-05-12 ENCOUNTER — Encounter: Payer: Self-pay | Admitting: Plastic Surgery

## 2021-05-12 DIAGNOSIS — N62 Hypertrophy of breast: Secondary | ICD-10-CM

## 2021-05-12 NOTE — Progress Notes (Signed)
? ?  Subjective:  ? ? Patient ID: Kimberly Mckay, female    DOB: 02-Apr-1980, 41 y.o.   MRN: [id number] ? ?The patient is a 41 year old female here for reevaluation of her axillary and breast area.  She had breast reduction and then she had excision of her axillary tissue.  She is disappointed a little upset that it take care of more of.  I understand.  I also wanted to keep her safe because of it being the axilla and having her lymph nodes and brachial plexus in that area.  We had a long discussion about options. ? ? ? ? ?Review of Systems  ?Constitutional: Negative.   ?Eyes: Negative.   ?Respiratory: Negative.    ?Cardiovascular: Negative.   ?Gastrointestinal: Negative.   ?Endocrine: Negative.   ?Genitourinary: Negative.   ?Musculoskeletal: Negative.   ?Hematological: Negative.   ? ?   ?Objective:  ? Physical Exam ?HENT:  ?   Head: Normocephalic and atraumatic.  ?Cardiovascular:  ?   Rate and Rhythm: Normal rate.  ?   Pulses: Normal pulses.  ?Pulmonary:  ?   Effort: Pulmonary effort is normal.  ?Skin: ?   General: Skin is warm.  ?   Capillary Refill: Capillary refill takes less than 2 seconds.  ?Neurological:  ?   Mental Status: She is alert and oriented to person, place, and time.  ?Psychiatric:     ?   Mood and Affect: Mood normal.     ?   Behavior: Behavior normal.  ? ? ? ?   ?Assessment & Plan:  ? ?  ICD-10-CM   ?1. Symptomatic mammary hypertrophy  N62   ?  ?  ?The patient could have revision surgery now.  Recommendation is for her to get closer to her desired or ideal body weight and then do it.  The reason is that we could then decide if she just needs liposuction or if she also needs excision of skin.  I will plan to see her back in 6 months.  She has already been to the [facility] so she did not want to repeat the referral.  That is understandable. ? ?Pictures were obtained of the patient and placed in the chart with the patient's or guardian's permission. ? ? ?

## 2021-06-05 ENCOUNTER — Other Ambulatory Visit: Payer: Self-pay

## 2021-06-05 ENCOUNTER — Emergency Department (INDEPENDENT_AMBULATORY_CARE_PROVIDER_SITE_OTHER)
Admission: RE | Admit: 2021-06-05 | Discharge: 2021-06-05 | Disposition: A | Discharge status: Home / Self Care | Payer: BC Managed Care – PPO | Source: Ambulatory Visit | Attending: Family Medicine | Admitting: Family Medicine

## 2021-06-05 VITALS — BP 125/83 | HR 62 | Temp 98.7°F | Resp 16 | Ht 67.0 in | Wt 275.0 lb

## 2021-06-05 DIAGNOSIS — R350 Frequency of micturition: Secondary | ICD-10-CM | POA: Diagnosis not present

## 2021-06-05 DIAGNOSIS — R3589 Other polyuria: Secondary | ICD-10-CM

## 2021-06-05 LAB — POCT URINALYSIS DIP (MANUAL ENTRY)
Bilirubin, UA: NEGATIVE
Glucose, UA: NEGATIVE mg/dL
Ketones, POC UA: NEGATIVE mg/dL
Nitrite, UA: NEGATIVE
Protein Ur, POC: NEGATIVE mg/dL
Spec Grav, UA: 1.025 (ref 1.010–1.025)
Urobilinogen, UA: 0.2 E.U./dL
pH, UA: 5.5 (ref 5.0–8.0)

## 2021-06-05 MED ORDER — CEPHALEXIN 500 MG PO CAPS: 500.0000 mg | ORAL_CAPSULE | Freq: Two times a day (BID) | ORAL | 0 refills | Status: DC

## 2021-06-05 NOTE — Discharge Instructions (Signed)
Increase fluid intake. °If symptoms become significantly worse during the night or over the weekend, proceed to the local emergency room.  °

## 2021-06-05 NOTE — ED Triage Notes (Signed)
Polyuria X 3 days ?HX UTI ?

## 2021-06-05 NOTE — ED Provider Notes (Signed)
?[facility] ? ? ? ?CSN: [id number] ?Arrival date & time: 06/05/21  1146 ? ? ?  ? ?History   ?Chief Complaint ?Chief Complaint  ?Patient presents with  ? Urinary Frequency  ?  Entered by patient  ? Polyuria  ? ? ?HPI ?Kimberly Mckay is a 41 y.o. female.  ? ?Patient complains of 2 to 3 day history of lower abdominal pressure and frequent urination.  She has a history of recurrent UTI but has not yet developed dysuria. ? ?The history is provided by the patient.  ?Urinary Frequency ?This is a recurrent problem. The current episode started more than 2 days ago. The problem occurs constantly. The problem has been gradually worsening. Pertinent negatives include no abdominal pain. Nothing aggravates the symptoms. Nothing relieves the symptoms. She has tried nothing for the symptoms.  ? ?Past Medical History:  ?Diagnosis Date  ? Anemia   ? Anxiety   ? Asthma   ? Back pain   ? Complication of anesthesia   ? pt aspirated following upper GI  ? Constipation   ? COVID-19 04/2019  ? Depression   ? DJD (degenerative joint disease)   ? Eczema   ? Fibromyalgia   ? PONV (postoperative nausea and vomiting)   ? Prediabetes   ? SOBOE (shortness of breath on exertion)   ? Swelling of both lower extremities   ? ? ?Patient Active Problem List  ? Diagnosis Date Noted  ? Polyphagia 06/16/2020  ? Back pain 04/18/2020  ? Neck pain 04/18/2020  ? Symptomatic mammary hypertrophy 04/18/2020  ? Insulin resistance 04/15/2020  ? Class 3 severe obesity with serious comorbidity and body mass index (BMI) of 40.0 to 44.9 in adult [facility]) 04/15/2020  ? Vitamin D deficiency 04/02/2020  ? At risk for diabetes mellitus 04/02/2020  ? Absolute anemia 04/02/2020  ? Other fatigue 03/20/2020  ? SOBOE (shortness of breath on exertion) 03/20/2020  ? Prediabetes 03/20/2020  ? Mood disorder ([place])- emotional eating 03/20/2020  ? Anemia 03/20/2020  ? Other constipation 03/20/2020  ? At risk for impaired metabolic function [id number]  ? ? ?Past Surgical  History:  ?Procedure Laterality Date  ? BREAST CYST EXCISION Bilateral 01/29/2021  ? Procedure: Excision of accessory axillary tissue;  Surgeon: Wallace Going, DO;  Location: [place] [place];  Service: Plastics;  Laterality: Bilateral;  1.5 hours  ? BREAST REDUCTION SURGERY Bilateral 09/03/2020  ? Procedure: BILATERAL BREAST REDUCTION WITH LIPOSUCTION;  Surgeon: Wallace Going, DO;  Location: [facility];  Service: Plastics;  Laterality: Bilateral;  4 hours total  ? UPPER GI ENDOSCOPY    ? pt states she aspirated with this procedure  ? WISDOM TOOTH EXTRACTION    ? ? ?OB History   ? ? Gravida  ?2  ? Para  ?2  ? Term  ?   ? Preterm  ?   ? AB  ?   ? Living  ?   ?  ? ? SAB  ?   ? IAB  ?   ? Ectopic  ?   ? Multiple  ?   ? Live Births  ?   ?   ?  ?  ? ? ? ?Home Medications   ? ?Prior to Admission medications   ?Medication Sig Start Date End Date Taking? Authorizing Provider  ?cephALEXin (KEFLEX) 500 MG capsule Take 1 capsule (500 mg total) by mouth 2 (two) times daily. 06/05/21  Yes Kandra Nicolas, MD  ?albuterol (VENTOLIN HFA)  108 (90 Base) MCG/ACT inhaler Inhale into the lungs. 01/29/14   [provider]  ?ALPRAZolam Duanne Moron) 0.5 MG tablet Take 0.5 mg by mouth at bedtime as needed for anxiety.    [provider]  ?ibuprofen (ADVIL,MOTRIN) 600 MG tablet Take 600 mg by mouth every 6 (six) hours as needed.    [provider]  ?lansoprazole (PREVACID) 15 MG capsule Take 15 mg by mouth daily at 12 noon.    [provider]  ?multivitamin (VIT W/EXTRA C) CHEW chewable tablet Chew by mouth.    [provider]  ? ? ?Family History ?Family History  ?Problem Relation Age of Onset  ? Diabetes Mother   ? Heart disease Mother   ? Cancer Mother   ? Thyroid disease Mother   ? Obesity Mother   ? Hypertension Father   ? Hyperlipidemia Father   ? Alcoholism Father   ? ? ?Social History ?Social History  ? ?Tobacco Use  ? Smoking status: Former  ?  Packs/day:  0.50  ?  Years: 20.00  ?  Pack years: 10.00  ?  Types: Cigarettes  ?  Quit date: 2019  ?  Years since quitting: 4.2  ? Smokeless tobacco: Never  ?Vaping Use  ? Vaping Use: Never used  ?Substance Use Topics  ? Alcohol use: Not Currently  ? Drug use: No  ? ? ? ?Allergies   ?Aspirin, Butorphanol, Propoxyphene, Sulfa antibiotics, and Meperidine ? ? ?Review of Systems ?Review of Systems  ?Gastrointestinal:  Negative for abdominal pain.  ?Genitourinary:  Positive for frequency and urgency. Negative for difficulty urinating, dysuria, flank pain, hematuria, menstrual problem, pelvic pain, vaginal bleeding and vaginal discharge.  ?All other systems reviewed and are negative. ? ? ?Physical Exam ?Triage Vital Signs ?ED Triage Vitals  ?Enc Vitals Group  ?   BP 06/05/21 1159 125/83  ?   Pulse Rate 06/05/21 1159 62  ?   Resp 06/05/21 1159 16  ?   Temp 06/05/21 1159 98.7 ?F (37.1 ?C)  ?   Temp Source 06/05/21 1159 Oral  ?   SpO2 06/05/21 1159 99 %  ?   Weight 06/05/21 1200 275 lb (124.7 kg)  ?   Height 06/05/21 1200 5\' 7"  (1.702 m)  ?   Head Circumference --   ?   Peak Flow --   ?   Pain Score 06/05/21 1159 1  ?   Pain Loc --   ?   Pain Edu? --   ?   Excl. in [place]? --   ? ?No data found. ? ?Updated Vital Signs ?BP 125/83 (BP Location: Left Arm)   Pulse 62   Temp 98.7 ?F (37.1 ?C) (Oral)   Resp 16   Ht 5\' 7"  (1.702 m)   Wt 124.7 kg   LMP 05/22/2021 (Exact Date)   SpO2 99%   BMI 43.07 kg/m?  ? ?Visual Acuity ?Right Eye Distance:   ?Left Eye Distance:   ?Bilateral Distance:   ? ?Right Eye Near:   ?Left Eye Near:    ?Bilateral Near:    ? ?Physical Exam ?Nursing notes and Vital Signs reviewed. ?Appearance:  Patient appears stated age, and in no acute distress.    ?Eyes:  Pupils are equal, round, and reactive to light and accomodation.  Extraocular movement is intact.  Conjunctivae are not inflamed   ?Pharynx:  Normal; moist mucous membranes  ?Neck:  Supple.  No adenopathy ?Lungs:  Clear to auscultation.  Breath sounds are equal.  Moving air well. ?Heart:  Regular rate and rhythm without murmurs, rubs, or gallops.  ?Abdomen:  Nontender without masses or hepatosplenomegaly.  Bowel sounds are present.  No CVA or flank tenderness.  ?Extremities:  No edema.  ?Skin:  No rash present.    ? ?UC Treatments / Results  ?Labs ?(all labs ordered are listed, but only abnormal results are displayed) ?Labs Reviewed  ?POCT URINALYSIS DIP (MANUAL ENTRY) - Abnormal; Notable for the following components:  ?    Result Value  ? Blood, UA trace-intact (*)   ? Leukocytes, UA Small (1+) (*)   ? All other components within normal limits  ?URINE CULTURE  ? ? ?EKG ? ? ?Radiology ?No results found. ? ?Procedures ?Procedures (including critical care time) ? ?Medications Ordered in UC ?Medications - No data to display ? ?Initial Impression / Assessment and Plan / UC Course  ?I have reviewed the triage vital signs and the nursing notes. ? ?Pertinent labs & imaging results that were available during my care of the patient were reviewed by me and considered in my medical decision making (see chart for details). ? ?  ?Urine culture pending.  Begin Keflex. ?Followup with Family Doctor if not improved in one week.  ? ?Final Clinical Impressions(s) / UC Diagnoses  ? ?Final diagnoses:  ?Frequency of urination and polyuria  ? ? ? ?Discharge Instructions   ? ?  ?Increase fluid intake. ? ?If symptoms become significantly worse during the night or over the weekend, proceed to the local emergency room.  ? ? ? ?ED Prescriptions   ? ? Medication Sig Dispense Auth. Provider  ? cephALEXin (KEFLEX) 500 MG capsule Take 1 capsule (500 mg total) by mouth 2 (two) times daily. 14 capsule Kandra Nicolas, MD  ? ?  ? ? ?  ?Kandra Nicolas, MD ?06/06/21 1823 ? ?

## 2021-06-07 LAB — URINE CULTURE
MICRO NUMBER:: 13265461
SPECIMEN QUALITY:: ADEQUATE

## 2021-09-30 ENCOUNTER — Encounter (INDEPENDENT_AMBULATORY_CARE_PROVIDER_SITE_OTHER): Payer: Self-pay

## 2021-10-09 DIAGNOSIS — R109 Unspecified abdominal pain: Secondary | ICD-10-CM | POA: Diagnosis not present

## 2021-10-09 DIAGNOSIS — M545 Low back pain, unspecified: Secondary | ICD-10-CM | POA: Diagnosis not present

## 2021-10-09 DIAGNOSIS — N939 Abnormal uterine and vaginal bleeding, unspecified: Secondary | ICD-10-CM | POA: Diagnosis not present

## 2021-10-09 DIAGNOSIS — R1030 Lower abdominal pain, unspecified: Secondary | ICD-10-CM | POA: Diagnosis not present

## 2021-10-14 ENCOUNTER — Telehealth: Payer: Self-pay | Admitting: *Deleted

## 2021-10-14 NOTE — Telephone Encounter (Signed)
Patient given appointment information for scheduled appointment on 10/15/2021.

## 2021-10-15 ENCOUNTER — Ambulatory Visit (INDEPENDENT_AMBULATORY_CARE_PROVIDER_SITE_OTHER): Payer: BC Managed Care – PPO | Admitting: Obstetrics and Gynecology

## 2021-10-15 ENCOUNTER — Encounter: Payer: Self-pay | Admitting: Obstetrics and Gynecology

## 2021-10-15 VITALS — BP 128/84 | HR 73 | Ht 67.0 in | Wt 288.0 lb

## 2021-10-15 DIAGNOSIS — N939 Abnormal uterine and vaginal bleeding, unspecified: Secondary | ICD-10-CM | POA: Diagnosis not present

## 2021-10-15 DIAGNOSIS — Z1231 Encounter for screening mammogram for malignant neoplasm of breast: Secondary | ICD-10-CM | POA: Diagnosis not present

## 2021-10-15 MED ORDER — MEGESTROL ACETATE 40 MG PO TABS: 40.0000 mg | ORAL_TABLET | Freq: Two times a day (BID) | ORAL | 5 refills | Status: DC

## 2021-10-15 NOTE — Progress Notes (Signed)
Last pap- 10/30/19- negative

## 2021-10-15 NOTE — Progress Notes (Signed)
Subjective:     Kimberly Mckay is a 41 y.o. female P2 with LMP 09/25/21 and BMI 45 who is here for the evaluation of abnormal uterine bleeding. Patient reports a history of a monthly 5-7 day period. However, over the past few months she started skipping period before the return of her normal period. She skipped 2 months before the onset of her cycle in August. She reports a very heavy flow with her current cycle with passage of large clots. She denies chest pain or dizziness. This bleeding pattern has been on going for nearly 20 days. She was seen in urgent care and ultrasound was normal on 8/18. She is without any other complaints.   Past Medical History:  Diagnosis Date   Anemia    Anxiety    Asthma    Back pain    Complication of anesthesia    pt aspirated following upper GI   Constipation    COVID-19 04/2019   Depression    DJD (degenerative joint disease)    Eczema    Fibromyalgia    PONV (postoperative nausea and vomiting)    Prediabetes    SOBOE (shortness of breath on exertion)    Swelling of both lower extremities    Past Surgical History:  Procedure Laterality Date   BREAST CYST EXCISION Bilateral 01/29/2021   Procedure: Excision of accessory axillary tissue;  Surgeon: Peggye Form, DO;  Location: [facility];  Service: Plastics;  Laterality: Bilateral;  1.5 hours   BREAST REDUCTION SURGERY Bilateral 09/03/2020   Procedure: BILATERAL BREAST REDUCTION WITH LIPOSUCTION;  Surgeon: Peggye Form, DO;  Location: [facility];  Service: Plastics;  Laterality: Bilateral;  4 hours total   UPPER GI ENDOSCOPY     pt states she aspirated with this procedure   WISDOM TOOTH EXTRACTION     Family History  Problem Relation Age of Onset   Diabetes Mother    Heart disease Mother    Cancer Mother    Thyroid disease Mother    Obesity Mother    Hypertension Father    Hyperlipidemia Father    Alcoholism Father     Social History    Socioeconomic History   Marital status: Married    Spouse name: Sobia Karger   Number of children: 2   Years of education: Not on file   Highest education level: Not on file  Occupational History   Occupation: Occupational psychologist  Tobacco Use   Smoking status: Former    Packs/day: 0.50    Years: 20.00    Total pack years: 10.00    Types: Cigarettes    Quit date: 2019    Years since quitting: 4.6   Smokeless tobacco: Never  Vaping Use   Vaping Use: Never used  Substance and Sexual Activity   Alcohol use: Not Currently   Drug use: No   Sexual activity: Not Currently    Partners: Male    Birth control/protection: None  Other Topics Concern   Not on file  Social History Narrative   Not on file   Social Determinants of Health   Financial Resource Strain: Not on file  Food Insecurity: Not on file  Transportation Needs: Not on file  Physical Activity: Not on file  Stress: Not on file  Social Connections: Not on file  Intimate Partner Violence: Not on file   Health Maintenance  Topic Date Due   COVID-19 Vaccine (1) Never done   HIV Screening  Never done   Hepatitis C Screening  Never done   TETANUS/TDAP  Never done   PAP SMEAR-Modifier  Never done   INFLUENZA VACCINE  09/22/2021   HPV VACCINES  Aged Out       Review of Systems Pertinent items noted in HPI and remainder of comprehensive ROS otherwise negative.   Objective:  Blood pressure 128/84, pulse 73, height 5\' 7"  (1.702 m), weight 288 lb (130.6 kg), last menstrual period 09/25/2021.    GENERAL: Well-developed, well-nourished female in no acute distress.  NEURO: alert and oriented x 3    Assessment:    41 yo with AUB.      Plan:    Reviewed ultrasound results with the patient Labs ordered Rx megace provided Will contact patient with abnormal results See After Visit Summary for Counseling Recommendations

## 2021-10-16 LAB — COMPREHENSIVE METABOLIC PANEL
AG Ratio: 1.3 (calc) (ref 1.0–2.5)
ALT: 11 U/L (ref 6–29)
AST: 15 U/L (ref 10–30)
Albumin: 3.8 g/dL (ref 3.6–5.1)
Alkaline phosphatase (APISO): 107 U/L (ref 31–125)
BUN: 10 mg/dL (ref 7–25)
CO2: 26 mmol/L (ref 20–32)
Calcium: 9.2 mg/dL (ref 8.6–10.2)
Chloride: 105 mmol/L (ref 98–110)
Creat: 0.77 mg/dL (ref 0.50–0.97)
Globulin: 3 g/dL (calc) (ref 1.9–3.7)
Glucose, Bld: 89 mg/dL (ref 65–99)
Potassium: 4.1 mmol/L (ref 3.5–5.3)
Sodium: 141 mmol/L (ref 135–146)
Total Bilirubin: 0.2 mg/dL (ref 0.2–1.2)
Total Protein: 6.8 g/dL (ref 6.1–8.1)

## 2021-10-16 LAB — HEMOGLOBIN A1C
Hgb A1c MFr Bld: 5.5 % of total Hgb (ref <5.7)
Mean Plasma Glucose: 111 mg/dL
eAG (mmol/L): 6.2 mmol/L

## 2021-10-16 LAB — CBC
HCT: 38.7 % (ref 35.0–45.0)
Hemoglobin: 12.7 g/dL (ref 11.7–15.5)
MCH: 27 pg (ref 27.0–33.0)
MCHC: 32.8 g/dL (ref 32.0–36.0)
MCV: 82.3 fL (ref 80.0–100.0)
MPV: 11.1 fL (ref 7.5–12.5)
Platelets: 290 10*3/uL (ref 140–400)
RBC: 4.7 10*6/uL (ref 3.80–5.10)
RDW: 14.3 % (ref 11.0–15.0)
WBC: 8.2 10*3/uL (ref 3.8–10.8)

## 2021-10-16 LAB — TSH: TSH: 1.91 mIU/L

## 2021-10-27 ENCOUNTER — Telehealth: Payer: BC Managed Care – PPO | Admitting: Physician Assistant

## 2021-10-27 NOTE — Progress Notes (Signed)
The patient no-showed for appointment despite this provider sending direct link, reaching out via phone with no response and waiting for at least 10 minutes from appointment time for patient to join. They will be marked as a NS for this appointment/time.  ? ?Laquandra Carrillo Cody Adrie Picking, PA-C ? ? ? ?

## 2021-10-29 DIAGNOSIS — L853 Xerosis cutis: Secondary | ICD-10-CM | POA: Diagnosis not present

## 2021-10-29 DIAGNOSIS — L719 Rosacea, unspecified: Secondary | ICD-10-CM | POA: Diagnosis not present

## 2021-11-06 ENCOUNTER — Ambulatory Visit: Payer: BC Managed Care – PPO | Admitting: Plastic Surgery

## 2021-12-17 ENCOUNTER — Ambulatory Visit: Payer: BC Managed Care – PPO | Admitting: Obstetrics and Gynecology

## 2021-12-17 ENCOUNTER — Telehealth: Payer: Self-pay

## 2021-12-17 ENCOUNTER — Other Ambulatory Visit (HOSPITAL_COMMUNITY)
Admission: RE | Admit: 2021-12-17 | Discharge: 2021-12-17 | Disposition: A | Discharge status: Home / Self Care | Payer: BC Managed Care – PPO | Source: Ambulatory Visit | Attending: Obstetrics and Gynecology | Admitting: Obstetrics and Gynecology

## 2021-12-17 ENCOUNTER — Ambulatory Visit (INDEPENDENT_AMBULATORY_CARE_PROVIDER_SITE_OTHER): Payer: BC Managed Care – PPO

## 2021-12-17 DIAGNOSIS — N898 Other specified noninflammatory disorders of vagina: Secondary | ICD-10-CM | POA: Insufficient documentation

## 2021-12-17 NOTE — Telephone Encounter (Signed)
Pt called yesterday requesting appointment for vaginal irritation. I let her know we do not have any available appointments for the rest of the week but, the only thing I could do is double book her for 10/26 at 1:10pm appt. Pt called today and said she cannot come at 1:10. I let her know the next available appointment is Monday 10/30 at 11:10am. Pt asked me multiple times, "So you are refusing to see me even though this is an emergency?". I assured pt that we were not refusing to see her but that we do not have any available appointments today or tomorrow. I also let pt know she can go be seen at primary care office or urgent care. Pt states she will not go to urgent care. I scheduled appointment for 10/30 at 11:10am

## 2021-12-17 NOTE — Progress Notes (Signed)
Pt her for vaginal irritation. Pt did self swab. Pt is coming on Monday 10/30 for provider visit.

## 2021-12-17 NOTE — Telephone Encounter (Signed)
Spoke with Dr.Constant and she reccommended pt come for nurse visit self swab since pt could not come for provider appt. I called pt and offered her a nurse visit appt that worked with her work schedule. Pt coming at 3:00pm for self swab.

## 2021-12-18 LAB — CERVICOVAGINAL ANCILLARY ONLY
Bacterial Vaginitis (gardnerella): NEGATIVE
Candida Glabrata: NEGATIVE
Candida Vaginitis: NEGATIVE
Comment: NEGATIVE
Comment: NEGATIVE
Comment: NEGATIVE

## 2021-12-21 ENCOUNTER — Ambulatory Visit (INDEPENDENT_AMBULATORY_CARE_PROVIDER_SITE_OTHER): Payer: BC Managed Care – PPO | Admitting: Obstetrics & Gynecology

## 2021-12-21 ENCOUNTER — Encounter: Payer: Self-pay | Admitting: Obstetrics & Gynecology

## 2021-12-21 ENCOUNTER — Other Ambulatory Visit: Payer: Self-pay | Admitting: Obstetrics & Gynecology

## 2021-12-21 VITALS — BP 128/85 | HR 68 | Ht 67.0 in | Wt 291.0 lb

## 2021-12-21 DIAGNOSIS — N939 Abnormal uterine and vaginal bleeding, unspecified: Secondary | ICD-10-CM | POA: Diagnosis not present

## 2021-12-21 DIAGNOSIS — N393 Stress incontinence (female) (male): Secondary | ICD-10-CM | POA: Diagnosis not present

## 2021-12-21 DIAGNOSIS — F411 Generalized anxiety disorder: Secondary | ICD-10-CM

## 2021-12-21 LAB — POCT URINE PREGNANCY: Preg Test, Ur: NEGATIVE

## 2021-12-21 MED ORDER — ALPRAZOLAM 0.5 MG PO TABS: ORAL_TABLET | ORAL | 0 refills | Status: DC

## 2021-12-21 MED ORDER — METFORMIN HCL 500 MG PO TABS: ORAL_TABLET | ORAL | 6 refills | Status: DC

## 2021-12-21 NOTE — Progress Notes (Signed)
Aptima negative on 10/26- symptoms have improved

## 2021-12-21 NOTE — Progress Notes (Signed)
   Subjective:    Patient ID: Kimberly Mckay, female    DOB: [birth year], 41 y.o.   MRN: [id number]  HPI 41 yo female presents for ongoing menometrrhopia.  Saw Dr. Elly Modena in August and was prescribed Megace.  Pt did not take due to concerns after researching on the Internet.  Pt continues to bleed most days of the month.  Some cycles are very heavy.    The vaginal irritation she was experiencing has resolved with keeping dry, rinsing with peri bottle after urinating and removing pads at night. Pt has urinary incontinence that has not been addressed to date.   Review of Systems  Constitutional:  Positive for unexpected weight change.  Respiratory: Negative.    Cardiovascular: Negative.   Gastrointestinal: Negative.   Genitourinary:  Positive for menstrual problem and vaginal bleeding. Negative for vaginal discharge and vaginal pain.       Urinary incontinence  Psychiatric/Behavioral: Negative.         Objective:   Physical Exam Vitals reviewed.  Constitutional:      General: She is not in acute distress.    Appearance: She is well-developed.  HENT:     Head: Normocephalic and atraumatic.  Eyes:     Conjunctiva/sclera: Conjunctivae normal.  Cardiovascular:     Rate and Rhythm: Normal rate.  Pulmonary:     Effort: Pulmonary effort is normal.  Genitourinary:    Comments: Vulva--no lesion Vagin--scant blood; pt havnign pain from speculum exam due to anxiety.  Will stop and provide premedication prior to next attempt.  Skin:    General: Skin is warm and dry.  Neurological:     Mental Status: She is alert and oriented to person, place, and time.  Psychiatric:        Mood and Affect: Mood normal.    Vitals:   12/21/21 1105  BP: 128/85  Pulse: 68  Weight: 291 lb (132 kg)  Height: 5\' 7"  (1.702 m)       Assessment & Plan:   41 year old female presents for both vaginal irritation as well as menometrorrhagia   The vaginal irritation has improved with keeping the  perineum dry.  Patient does have significant urinary incontinence that sounds to be stress.  She is seen a bladder specialist in the past.  I think she would benefit from a referral to our urogynecologist. Menometrorrhagia: Patient had a transvaginal ultrasound at the [facility].  It is under care everywhere.  Is unremarkable for structural abnormalities.  The lining was 13 mm.  Given her constant bleeding, a endometrial biopsy is warranted.  Patient had trouble with the speculum exam and we will have her come back with some premedication to help with her anxiety. Prediabetes; patient was on metformin to help with prediabetes and weight loss.  Her doctor recently retired and she has not established another primary care.  Metform was prescribed today.  She was referred to [facility] family practice for primary care.   ENDOMETRIAL BIOPSY     The indications for endometrial biopsy were reviewed.   Risks of the biopsy including cramping, bleeding, infection, uterine perforation, inadequate specimen and need for additional procedures  were discussed. The patient states she understands and agrees to undergo procedure today. Consent was signed. Time out was performed. Urine HCG was negative.   45 minutes was spent during this encounter.  This included review of records from both inside and outside [facility] health, history and physical exam, attempted endometrial biopsy, counseling, and documentation.

## 2021-12-24 MED ORDER — ALPRAZOLAM 0.5 MG PO TABS: ORAL_TABLET | ORAL | 0 refills | Status: DC

## 2021-12-28 ENCOUNTER — Ambulatory Visit (INDEPENDENT_AMBULATORY_CARE_PROVIDER_SITE_OTHER): Payer: BC Managed Care – PPO | Admitting: Obstetrics & Gynecology

## 2021-12-28 ENCOUNTER — Other Ambulatory Visit (HOSPITAL_COMMUNITY)
Admission: RE | Admit: 2021-12-28 | Discharge: 2021-12-28 | Disposition: A | Discharge status: Home / Self Care | Payer: BC Managed Care – PPO | Source: Ambulatory Visit | Attending: Obstetrics & Gynecology | Admitting: Obstetrics & Gynecology

## 2021-12-28 ENCOUNTER — Encounter: Payer: Self-pay | Admitting: Obstetrics & Gynecology

## 2021-12-28 VITALS — BP 135/87 | HR 71 | Wt 288.0 lb

## 2021-12-28 DIAGNOSIS — F411 Generalized anxiety disorder: Secondary | ICD-10-CM

## 2021-12-28 DIAGNOSIS — N888 Other specified noninflammatory disorders of cervix uteri: Secondary | ICD-10-CM

## 2021-12-28 DIAGNOSIS — N8501 Benign endometrial hyperplasia: Secondary | ICD-10-CM | POA: Diagnosis not present

## 2021-12-28 DIAGNOSIS — N939 Abnormal uterine and vaginal bleeding, unspecified: Secondary | ICD-10-CM | POA: Insufficient documentation

## 2021-12-28 LAB — POCT URINE PREGNANCY: Preg Test, Ur: NEGATIVE

## 2021-12-28 MED ORDER — ALPRAZOLAM 0.5 MG PO TABS: ORAL_TABLET | ORAL | 0 refills | Status: DC

## 2021-12-28 MED ORDER — MEDROXYPROGESTERONE ACETATE 10 MG PO TABS: ORAL_TABLET | ORAL | 0 refills | Status: DC

## 2021-12-28 NOTE — Progress Notes (Signed)
   Subjective:    Patient ID: Kimberly Mckay, female    DOB: January 02, 1981, 41 y.o.   MRN: [id number]  HPI  Pt presents for endometrial biopsy due to menometrorrhagia.  Premeds given today to help with anxiety.  Review of Systems  Constitutional: Negative.   Respiratory: Negative.    Cardiovascular: Negative.   Gastrointestinal: Negative.   Genitourinary:  Positive for menstrual problem and vaginal bleeding.       Objective:   Physical Exam Vitals reviewed.  Constitutional:      General: She is not in acute distress.    Appearance: She is well-developed.  HENT:     Head: Normocephalic and atraumatic.  Eyes:     Conjunctiva/sclera: Conjunctivae normal.  Cardiovascular:     Rate and Rhythm: Normal rate.  Pulmonary:     Effort: Pulmonary effort is normal.  Genitourinary:    Comments: Tanner V Vulva:  No lesion Vagina:  Pink, no lesions, moderat amount of blood requiring multiple fox swabs to keep visualization during exam and biopsy Cervix:  No CMT, cervix has irregularities, ? Nabothian cysts--unable to visualize due to uterine bleeding and ? Bleeding of a friable cervix.  Uterus:  Non tender, mobile Right adnexa--non tender, no mass Left adnexa--non tender, no mass Skin:    General: Skin is warm and dry.  Neurological:     Mental Status: She is alert and oriented to person, place, and time.  Psychiatric:        Mood and Affect: Mood normal.    Vitals:   12/28/21 1020  BP: 135/87  Pulse: 71  Weight: 288 lb (130.6 kg)       Assessment & Plan:  [name] presents for endometrial biopsy.  Pt took xanax for anxiety which helped during exam.  Husband, Remo Lipps, present and ride for [place].   Endometrial biopsy performed.  Use SNOW Speculum in future Cervix friable and difficult to visualize due to blood.Unsure is all uterine.  Some could be due to friable cervix.  No mass seen or felt definitively.  Unable to see well enough today to biopsy Reviewed pap  smears form outside locations.  HPV negative and neither (2 in past 10 years) have endocervix/T-zone.  Will give provera for 2 weeks and return for cervical exam and possible biopsy.   Extensive counseling about provera and need for rpt cervical exam.  Al questions answered.   ENDOMETRIAL BIOPSY     The indications for endometrial biopsy were reviewed.   Risks of the biopsy including cramping, bleeding, infection, uterine perforation, inadequate specimen and need for additional procedures  were discussed. The patient states she understands and agrees to undergo procedure today. Consent was signed. Time out was performed. Urine HCG was negative. A sterile speculum was placed in the patient's vagina and the cervix was prepped with Betadine. The 3 mm pipelle was introduced into the endometrial cavity without difficulty to a depth of approx 8 cm, and a moderate amount of tissue was obtained and sent to pathology. The instruments were removed from the patient's vagina. Minimal bleeding from the cervix was noted. The patient tolerated the procedure well. Routine post-procedure instructions were given to the patient. The patient will follow up to review the results and for further management.

## 2021-12-28 NOTE — Addendum Note (Signed)
Addended by: Guss Bunde on: 12/28/2021 12:36 PM   Modules accepted: Level of Service

## 2021-12-29 ENCOUNTER — Ambulatory Visit: Payer: BC Managed Care – PPO

## 2021-12-30 LAB — SURGICAL PATHOLOGY

## 2022-01-18 ENCOUNTER — Encounter: Payer: Self-pay | Admitting: Obstetrics & Gynecology

## 2022-01-18 ENCOUNTER — Other Ambulatory Visit (HOSPITAL_COMMUNITY)
Admission: RE | Admit: 2022-01-18 | Discharge: 2022-01-18 | Disposition: A | Discharge status: Home / Self Care | Payer: BC Managed Care – PPO | Source: Ambulatory Visit | Attending: Obstetrics & Gynecology | Admitting: Obstetrics & Gynecology

## 2022-01-18 ENCOUNTER — Ambulatory Visit (INDEPENDENT_AMBULATORY_CARE_PROVIDER_SITE_OTHER): Payer: BC Managed Care – PPO | Admitting: Obstetrics & Gynecology

## 2022-01-18 VITALS — BP 135/83 | HR 64 | Ht 67.0 in | Wt 291.0 lb

## 2022-01-18 DIAGNOSIS — Z30430 Encounter for insertion of intrauterine contraceptive device: Secondary | ICD-10-CM | POA: Diagnosis not present

## 2022-01-18 DIAGNOSIS — N8501 Benign endometrial hyperplasia: Secondary | ICD-10-CM | POA: Diagnosis not present

## 2022-01-18 DIAGNOSIS — N841 Polyp of cervix uteri: Secondary | ICD-10-CM | POA: Diagnosis not present

## 2022-01-18 DIAGNOSIS — N8500 Endometrial hyperplasia, unspecified: Secondary | ICD-10-CM | POA: Diagnosis not present

## 2022-01-18 DIAGNOSIS — N888 Other specified noninflammatory disorders of cervix uteri: Secondary | ICD-10-CM | POA: Insufficient documentation

## 2022-01-18 DIAGNOSIS — N939 Abnormal uterine and vaginal bleeding, unspecified: Secondary | ICD-10-CM | POA: Diagnosis not present

## 2022-01-18 MED ORDER — LEVONORGESTREL 20 MCG/DAY IU IUD
1.0000 | INTRAUTERINE_SYSTEM | Freq: Once | INTRAUTERINE | Status: AC
  Administered 2022-01-18: 1 via INTRAUTERINE

## 2022-01-18 NOTE — Progress Notes (Signed)
Subjective:    Patient ID: Kimberly Mckay, female    DOB: [birth year], 41 y.o.   MRN: [id number]  HPI  Kimberly Mckay is a 41 year old female who presents for repeat vaginal exam to evaluate cervix now that uterine bleeding is under control.  At last exam, there was so much bleeding I could not get a clear view of her cervix.  She also said that her last gynecologist was unable to view her cervix well.  I will also do a Pap smear as others have been unsatisfactory.  Patient states the bleeding is much controlled.  She takes 1 to 2 tablets of Provera day.  She has not had intercourse.  But prior to this bleeding, she never had scrotal bleeding.  She does feel very crampy and uncomfortable in general in her pelvis.  Review of Systems  Constitutional: Negative.   Respiratory: Negative.    Cardiovascular: Negative.   Gastrointestinal: Negative.   Genitourinary:  Positive for pelvic pain and vaginal bleeding. Negative for vaginal pain.  Psychiatric/Behavioral: Negative.         Objective:   Physical Exam Vitals reviewed.  Constitutional:      General: She is not in acute distress.    Appearance: She is well-developed.  HENT:     Head: Normocephalic and atraumatic.  Eyes:     Conjunctiva/sclera: Conjunctivae normal.  Cardiovascular:     Rate and Rhythm: Normal rate.  Pulmonary:     Effort: Pulmonary effort is normal.  Genitourinary:    Skin:    General: Skin is warm and dry.  Neurological:     Mental Status: She is alert and oriented to person, place, and time.  Psychiatric:        Mood and Affect: Mood normal.    Vitals:   01/18/22 0831  BP: 135/83  Pulse: 64  Weight: 291 lb (132 kg)  Height: 5\' 7"  (1.702 m)    SURGICAL PATHOLOGY CASE: PATIENT: Kimberly Mckay Surgical Pathology Report  Clinical History: AUB (cm)  FINAL MICROSCOPIC DIAGNOSIS:  A. ENDOMETRIUM, BIOPSY: - Fragments of benign endometrium with simple hyperplasia without atypia -  Negative for malignancy  GROSS DESCRIPTION:  Received in formalin are tan, hemorrhagic soft tissue fragments that are entirely submitted. Volume: 2.5 x 1.9 x 0.2 cm. (2 B)  (KW, 12/29/2021)          Assessment & Plan:  41 year old female with endometrial hyperplasia and inadequate pelvic exam during endometrial biopsy.  She returns today to discuss results and treatment as well as to view cervix now that bleeding is much less on Provera.  Very friable cervix--bleeds with q tip; pap msear done as last MD was unable to see cervix.  Biopsied area of most bleeding. If spotting continues can treat for cervicitis with doxy bid for a week.  Simple endometrial hyperplasia--discussed options for treatment and decided on Mirena.  IUD Procedure Note Patient identified, informed consent performed.  Discussed risks of irregular bleeding, cramping, infection, malpositioning or misplacement of the IUD outside the uterus which may require further procedures. Time out was performed.  Urine pregnancy test negative.  Speculum placed in the vagina.  Cervix visualized.  Cleaned with Betadine x 2.  Grasped anteriorly with a single tooth tenaculum.  Uterus sounded to 8 cm.  Mirena IUD placed per manufacturer's recommendations.  Strings trimmed to 3 cm. Tenaculum was removed, good hemostasis noted with monsels.  Patient tolerated procedure well.   Patient was given post-procedure instructions and the Mirena care  card with expiration date.  Patient was also asked to check IUD strings periodically and follow up in 6 weeks for IUD check.

## 2022-01-20 ENCOUNTER — Encounter: Payer: Self-pay | Admitting: Obstetrics & Gynecology

## 2022-01-20 LAB — SURGICAL PATHOLOGY

## 2022-01-21 LAB — CYTOLOGY - PAP
Comment: NEGATIVE
Diagnosis: NEGATIVE
Diagnosis: REACTIVE
High risk HPV: NEGATIVE

## 2022-01-25 ENCOUNTER — Encounter: Payer: Self-pay | Admitting: Obstetrics & Gynecology

## 2022-01-25 ENCOUNTER — Ambulatory Visit (INDEPENDENT_AMBULATORY_CARE_PROVIDER_SITE_OTHER): Payer: BC Managed Care – PPO | Admitting: Obstetrics & Gynecology

## 2022-01-25 ENCOUNTER — Ambulatory Visit (INDEPENDENT_AMBULATORY_CARE_PROVIDER_SITE_OTHER): Payer: BC Managed Care – PPO

## 2022-01-25 VITALS — BP 151/92 | HR 73 | Temp 98.3°F | Ht 67.0 in | Wt 292.0 lb

## 2022-01-25 DIAGNOSIS — N8501 Benign endometrial hyperplasia: Secondary | ICD-10-CM | POA: Diagnosis not present

## 2022-01-25 DIAGNOSIS — N888 Other specified noninflammatory disorders of cervix uteri: Secondary | ICD-10-CM | POA: Diagnosis not present

## 2022-01-25 DIAGNOSIS — N939 Abnormal uterine and vaginal bleeding, unspecified: Secondary | ICD-10-CM | POA: Diagnosis not present

## 2022-01-25 DIAGNOSIS — R102 Pelvic and perineal pain: Secondary | ICD-10-CM

## 2022-01-25 DIAGNOSIS — Z6841 Body Mass Index (BMI) 40.0 and over, adult: Secondary | ICD-10-CM

## 2022-01-25 DIAGNOSIS — Z30432 Encounter for removal of intrauterine contraceptive device: Secondary | ICD-10-CM

## 2022-01-25 MED ORDER — CYCLOBENZAPRINE HCL 10 MG PO TABS: 10.0000 mg | ORAL_TABLET | Freq: Three times a day (TID) | ORAL | 1 refills | Status: AC | PRN

## 2022-01-25 MED ORDER — MEGESTROL ACETATE 40 MG PO TABS: ORAL_TABLET | ORAL | 1 refills | Status: DC

## 2022-01-25 NOTE — Progress Notes (Signed)
GYN VISIT Patient name: [name] MRN [id number]  Date of birth: [birth year] Chief Complaint:   Vaginal Bleeding  History of Present Illness:   Kimberly Mckay is a 41 y.o. G2P2 female being seen today for the following concerns:  AUB/pelvic pain: This has been an ongoing issue for the entire year.  She has been managed conservatively with progesterone tablets and recently IUD was placed; however, she had not had any improvement.  Additionally, over the past 1-2 days she has noted horrible pain.  Pain mostly midline lower pelvis and pain in her back.  Feels like she is having labor pain, 8/10.  Taking 3 ibuprofen with minimal improvement.  Pain is constant and both aching and sharp.  Some nausea, no vomiting.  Some constipation.  No urinary concerns.  No other acute complaints.  In terms of her bleeding, she notes daily bleeding since March.  Sometimes bleeding is heavy with passage of large clots.  Currently with 2 tabs progesterone daily she is using about 3-4 super pads per day.  Records reviewed- s/p IUD insertion on 11/27 due to endometrial hyperplasia.  Noting extreme pain and worsening of bleeding.    At this point, she desires more permanent management of her symptoms and wishes to consider surgical intervention.  Condoms for contraception NSVD x 2.  No prior abdominal surgeries  Patient's last menstrual period was 12/28/2021 (exact date).     03/20/2020   11:18 AM  Depression screen PHQ 2/9  Decreased Interest 3  Down, Depressed, Hopeless 3  PHQ - 2 Score 6  Altered sleeping 2  Tired, decreased energy 3  Change in appetite 3  Feeling bad or failure about yourself  3  Trouble concentrating 3  Moving slowly or fidgety/restless 3  Suicidal thoughts 0  PHQ-9 Score 23  Difficult doing work/chores Very difficult     Review of Systems:   Pertinent items are noted in HPI Denies fever/chills, dizziness, headaches, visual disturbances, fatigue, shortness of breath,  chest pain. Pertinent History Reviewed:  Reviewed past medical,surgical, social, obstetrical and family history.  Reviewed problem list, medications and allergies. Physical Assessment:   Vitals:   01/25/22 0858  BP: (!) 151/92  Pulse: 73  Temp: 98.3 F (36.8 C)  Weight: 292 lb (132.5 kg)  Height: 5\' 7"  (1.702 m)  Body mass index is 45.73 kg/m.       Physical Examination:   General appearance: alert, well appearing, and in no distress  Psych: mood appropriate, normal affect  Skin: warm & dry   Cardiovascular: normal heart rate noted  Respiratory: normal respiratory effort, no distress  Abdomen: soft, +tenderness lower abdomen- mostly midline.  No rebound, no guarding  Pelvic: VULVA: normal appearing vulva with no masses, tenderness or lesions, VAGINA: normal appearing vagina with normal color and discharge, ~20cc of dark mucus-like blood noted, no lesions, CERVIX: normal appearing cervix without discharge or lesions, IUD strings visualized.  On bimanual exam- normal sized mobile uterus  Extremities: no edema    IUD REMOVAL   Time out was performed.  A sterile speculum was placed in the vagina.  The cervix was visualized, and the strings visible. They were grasped and the Mirena  IUD was easily removed intact without complications. The patient tolerated the procedure well.    Chaperone:  Deanna Sola     Assessment & Plan:  1) AUB, Pelvic pain -Discussed pros/cons of IUD removal now vs giving the device more time.  After much discussion, pt desires removal  of device today.  See above procedure -discussed management with surgical intervention- recommendation for laparoscopic hysterectomy with bilateral salpingectomy.  Reviewed risk/benefits including but not limited to risk of bleeding, infection and injury to surrounding organs -discussed same day (possibly overnight) hospitalizations -reviewed recovery 6-8wks.  Questions/concerns were addressed and she desires to proceed at  next available -Discussed pros/cons of IUD removal now vs giving the device more time.  After much discussion, pt desires removal of device today.  See above procedure -will also plan for pelvic US to assess uterine size -note sent to Dr. Para March regarding surgical scheduling  Orders Placed This Encounter  Procedures   US PELVIC COMPLETE WITH TRANSVAGINAL    Return for to be determined, plan to schedule surgery.   Myna Hidalgo, DO Attending Obstetrician & Gynecologist, [facility] for [employer], [facility]

## 2022-01-25 NOTE — Progress Notes (Signed)
Notes 2 days of stomach and back pain and worsening bleeding

## 2022-01-26 ENCOUNTER — Encounter: Payer: Self-pay | Admitting: Obstetrics & Gynecology

## 2022-01-27 ENCOUNTER — Encounter: Payer: Self-pay | Admitting: Family Medicine

## 2022-01-27 ENCOUNTER — Telehealth: Payer: Self-pay

## 2022-01-27 ENCOUNTER — Ambulatory Visit (INDEPENDENT_AMBULATORY_CARE_PROVIDER_SITE_OTHER): Payer: BC Managed Care – PPO | Admitting: Family Medicine

## 2022-01-27 VITALS — BP 130/112 | HR 81 | Ht 67.0 in | Wt 292.0 lb

## 2022-01-27 DIAGNOSIS — Z6841 Body Mass Index (BMI) 40.0 and over, adult: Secondary | ICD-10-CM | POA: Diagnosis not present

## 2022-01-27 DIAGNOSIS — R030 Elevated blood-pressure reading, without diagnosis of hypertension: Secondary | ICD-10-CM | POA: Diagnosis not present

## 2022-01-27 DIAGNOSIS — F411 Generalized anxiety disorder: Secondary | ICD-10-CM | POA: Diagnosis not present

## 2022-01-27 MED ORDER — WEGOVY 0.25 MG/0.5ML ~~LOC~~ SOAJ: 0.2500 mg | SUBCUTANEOUS | 0 refills | Status: DC

## 2022-01-27 MED ORDER — IBUPROFEN 800 MG PO TABS: 800.0000 mg | ORAL_TABLET | Freq: Three times a day (TID) | ORAL | 0 refills | Status: DC | PRN

## 2022-01-27 NOTE — Progress Notes (Signed)
New Patient Office Visit  Subjective    Patient ID: Kimberly Mckay, female    DOB: 1980/07/23  Age: 41 y.o. MRN: [id number]  CC:  Chief Complaint  Patient presents with   Establish Care    HPI Kimberly Mckay presents to establish care. She has a pmh of anxiety for which she is on xanax for, but tells me she takes this only as needed for when she has a tough time. She tells me a 30 day supply will usually last her months. She has tried other anti-anxiety medication in the past and says they have not been efficacious for her.   BP is elevated today. She has not been told she has had blood pressure problems in the past. She tells me she is in a lot of pain today due to abdominal cramping from AUB for which she is having a hysterectomy for and is currently being followed by OBGYN.  Pt also presents with interest in weight loss medication. She was previously seen at a healthy weight loss clinic and started on metformin but has not noted any weight changes for this. She denies any family history of medullary thyroid cancer. She partakes in a sedentary job. She has tried to work on diet.   Outpatient Encounter Medications as of 01/27/2022  Medication Sig   albuterol (VENTOLIN HFA) 108 (90 Base) MCG/ACT inhaler Inhale into the lungs.   ALPRAZolam (XANAX) 0.5 MG tablet Take 0.5 mg by mouth at bedtime as needed for anxiety.   ALPRAZolam (XANAX) 0.5 MG tablet Take two tablets 1-2 hours before procedure and one tablet upon arrival to office prn   cyclobenzaprine (FLEXERIL) 10 MG tablet Take 1 tablet (10 mg total) by mouth every 8 (eight) hours as needed for up to 3 days for muscle spasms.   ibuprofen (ADVIL) 800 MG tablet Take 1 tablet (800 mg total) by mouth every 8 (eight) hours as needed.   lansoprazole (PREVACID) 15 MG capsule Take 15 mg by mouth daily at 12 noon.   megestrol (MEGACE) 40 MG tablet Take 3 tablets (120 mg total) by mouth daily for 5 days, THEN 2 tablets (80 mg total) daily for  5 days, THEN 1 tablet (40 mg total) daily.   multivitamin (VIT W/EXTRA C) CHEW chewable tablet Chew by mouth.   WEGOVY 0.25 MG/0.5ML SOAJ Inject 0.25 mg into the skin once a week. Use this dose for 1 month (4 shots) and then increase to next higher dose.   [DISCONTINUED] ibuprofen (ADVIL,MOTRIN) 600 MG tablet Take 600 mg by mouth every 6 (six) hours as needed.   [DISCONTINUED] metFORMIN (GLUCOPHAGE) 500 MG tablet Take one tablet a day for 1 week then bid   No facility-administered encounter medications on file as of 01/27/2022.    Past Medical History:  Diagnosis Date   Anemia    Anxiety    Asthma    Back pain    Complication of anesthesia    pt aspirated following upper GI   Constipation    COVID-19 04/2019   Depression    DJD (degenerative joint disease)    Eczema    Fibromyalgia    PONV (postoperative nausea and vomiting)    Prediabetes    SOBOE (shortness of breath on exertion)    Swelling of both lower extremities     Past Surgical History:  Procedure Laterality Date   BREAST CYST EXCISION Bilateral 01/29/2021   Procedure: Excision of accessory axillary tissue;  Surgeon: Peggye Form, DO;  Location: [facility];  Service: Plastics;  Laterality: Bilateral;  1.5 hours   BREAST REDUCTION SURGERY Bilateral 09/03/2020   Procedure: BILATERAL BREAST REDUCTION WITH LIPOSUCTION;  Surgeon: Peggye Form, DO;  Location: [facility];  Service: Plastics;  Laterality: Bilateral;  4 hours total   UPPER GI ENDOSCOPY     pt states she aspirated with this procedure   WISDOM TOOTH EXTRACTION      Family History  Problem Relation Age of Onset   Diabetes Mother    Heart disease Mother    Cancer Mother    Thyroid disease Mother    Obesity Mother    Hypertension Father    Hyperlipidemia Father    Alcoholism Father     Social History   Socioeconomic History   Marital status: Married    Spouse name: Deyanira Fesler   Number of children: 2    Years of education: Not on file   Highest education level: Not on file  Occupational History   Occupation: Occupational psychologist  Tobacco Use   Smoking status: Former    Packs/day: 0.50    Years: 20.00    Total pack years: 10.00    Types: Cigarettes    Quit date: 2019    Years since quitting: 4.9   Smokeless tobacco: Never  Vaping Use   Vaping Use: Never used  Substance and Sexual Activity   Alcohol use: Not Currently   Drug use: No   Sexual activity: Not Currently    Partners: Male    Birth control/protection: None  Other Topics Concern   Not on file  Social History Narrative   Not on file   Social Determinants of Health   Financial Resource Strain: Not on file  Food Insecurity: Not on file  Transportation Needs: Not on file  Physical Activity: Not on file  Stress: Not on file  Social Connections: Not on file  Intimate Partner Violence: Not on file    Review of Systems  Constitutional:  Negative for chills and fever.  Respiratory:  Negative for cough and shortness of breath.   Cardiovascular:  Negative for chest pain.  Neurological:  Negative for headaches.        Objective    BP (!) 130/112   Pulse 81   Ht 5\' 7"  (1.702 m)   Wt 292 lb (132.5 kg)   LMP 12/28/2021 (Exact Date)   SpO2 98%   BMI 45.73 kg/m   Physical Exam Vitals and nursing note reviewed.  Constitutional:      General: She is not in acute distress.    Appearance: Normal appearance.  HENT:     Head: Normocephalic and atraumatic.     Right Ear: External ear normal.     Left Ear: External ear normal.     Nose: Nose normal.  Eyes:     Conjunctiva/sclera: Conjunctivae normal.  Cardiovascular:     Rate and Rhythm: Normal rate and regular rhythm.  Pulmonary:     Effort: Pulmonary effort is normal.     Breath sounds: Normal breath sounds.  Neurological:     General: No focal deficit present.     Mental Status: She is alert and oriented to person, place, and time.   Psychiatric:        Mood and Affect: Mood normal.        Behavior: Behavior normal.        Thought Content: Thought content normal.  Judgment: Judgment normal.       Assessment & Plan:   Problem List Items Addressed This Visit       Other   Class 3 severe obesity due to excess calories without serious comorbidity with body mass index (BMI) of 45.0 to 49.9 in adult [facility]) - Primary    - believe pt would benefit from wegovy 0.25mg  for weight management with her BMI at 45. Pt denies any family history of medullary thyroid cancer  - counseled pt on injection use. Will follow up in 4 weeks to assess compliance and increase dose at that time.      Relevant Medications   WEGOVY 0.25 MG/0.5ML SOAJ   Generalized anxiety disorder    - pt says she has a long standing history of GAD and does not like to take medication. However she has been on xanax in the past and this is the only thing that works for her. We discussed how this is not something we want her to be on long term due to side effects of falls and cognitive changes. Pt is aware of side effects. I cannot at this time cut her off due to withdrawal side effects. I encouraged her to think about being on a long term anxiety medication that she can take daily as this will be control/stabilize her anxiety. We discussed how using xanax is more of a bandaid on a wound situation. Pt does not need refills on xanax right now and says she uses them more of an as needed bases. I told patient if she needs a refill we can give her 20 tab for 6 months to see how many she uses/needs as a monitoring parameter and make adjustments moving forward as needed.       Elevated blood pressure reading    - blood pressure elevated on exam today. Pt says at previous doctor's offices her BP is usually a lot lower. At this time coupled with her pain, I will not go ahead and add bp medication. If pressure is high at next visit we will need to consider BP meds - for  her pain, I have sent in ibuprofen 800mg  to see if that can get her under better control.        Return in about 4 weeks (around 02/24/2022) for wegovy follow up.   04/25/2022, DO

## 2022-01-27 NOTE — Assessment & Plan Note (Signed)
-   pt says she has a long standing history of GAD and does not like to take medication. However she has been on xanax in the past and this is the only thing that works for her. We discussed how this is not something we want her to be on long term due to side effects of falls and cognitive changes. Pt is aware of side effects. I cannot at this time cut her off due to withdrawal side effects. I encouraged her to think about being on a long term anxiety medication that she can take daily as this will be control/stabilize her anxiety. We discussed how using xanax is more of a bandaid on a wound situation. Pt does not need refills on xanax right now and says she uses them more of an as needed bases. I told patient if she needs a refill we can give her 20 tab for 6 months to see how many she uses/needs as a monitoring parameter and make adjustments moving forward as needed.

## 2022-01-27 NOTE — Assessment & Plan Note (Signed)
-   believe pt would benefit from wegovy 0.25mg  for weight management with her BMI at 45. Pt denies any family history of medullary thyroid cancer  - counseled pt on injection use. Will follow up in 4 weeks to assess compliance and increase dose at that time.

## 2022-01-27 NOTE — Assessment & Plan Note (Signed)
-   blood pressure elevated on exam today. Pt says at previous doctor's offices her BP is usually a lot lower. At this time coupled with her pain, I will not go ahead and add bp medication. If pressure is high at next visit we will need to consider BP meds - for her pain, I have sent in ibuprofen 800mg  to see if that can get her under better control.

## 2022-01-27 NOTE — Telephone Encounter (Signed)
Patient sent a Mychart message regarding [facility] Lutes - sounds like it may be needing a PA .  Would you check into this for her? Thank you so much!! ' Rogene Houston, LPN

## 2022-01-28 ENCOUNTER — Encounter: Payer: Self-pay | Admitting: Family Medicine

## 2022-01-28 ENCOUNTER — Telehealth: Payer: Self-pay

## 2022-01-28 IMAGING — MG DIGITAL DIAGNOSTIC BILAT W/ TOMO W/ CAD
8 of 15 series · 8 of 40 positions shown · non-contrast
Comparison: None.

CLINICAL DATA: Patient with chronic back and neck pain.
Preprocedural evaluation prior to possible reduction surgery.

EXAM:
DIGITAL DIAGNOSTIC BILATERAL MAMMOGRAM WITH TOMOSYNTHESIS AND CAD;
ULTRASOUND RIGHT BREAST LIMITED
TECHNIQUE: Bilateral digital diagnostic mammography and breast tomosynthesis
was performed. The images were evaluated with computer-aided
detection.; Targeted ultrasound examination of the right breast was
performed

[R CC]
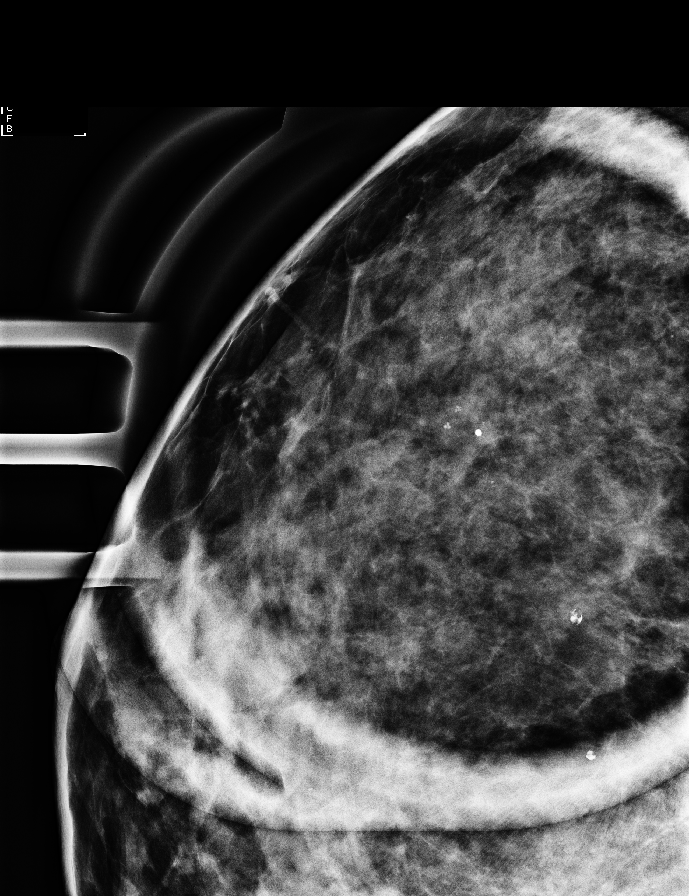

[R ML]
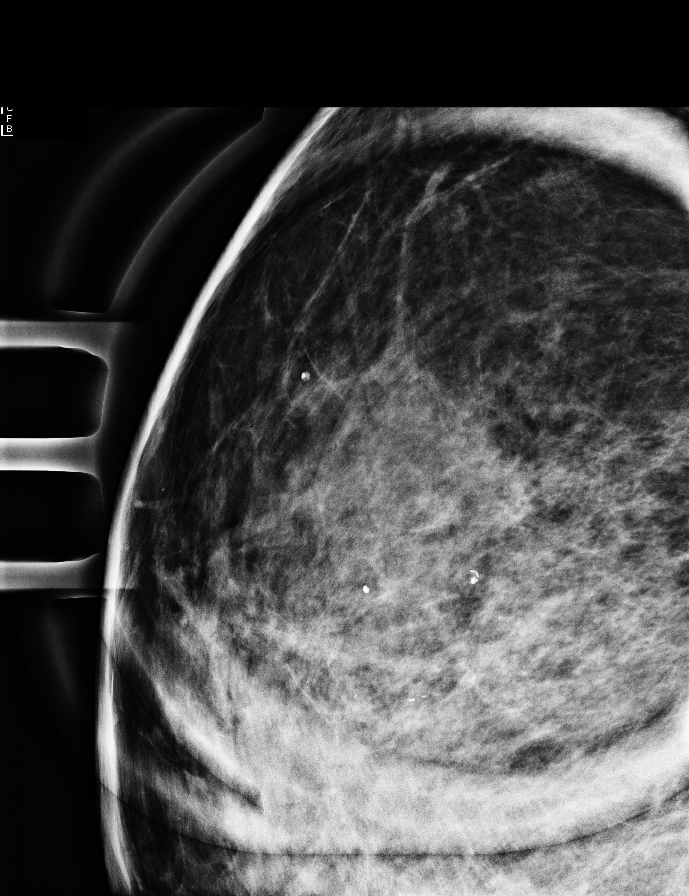

[L MLO synth-2D]
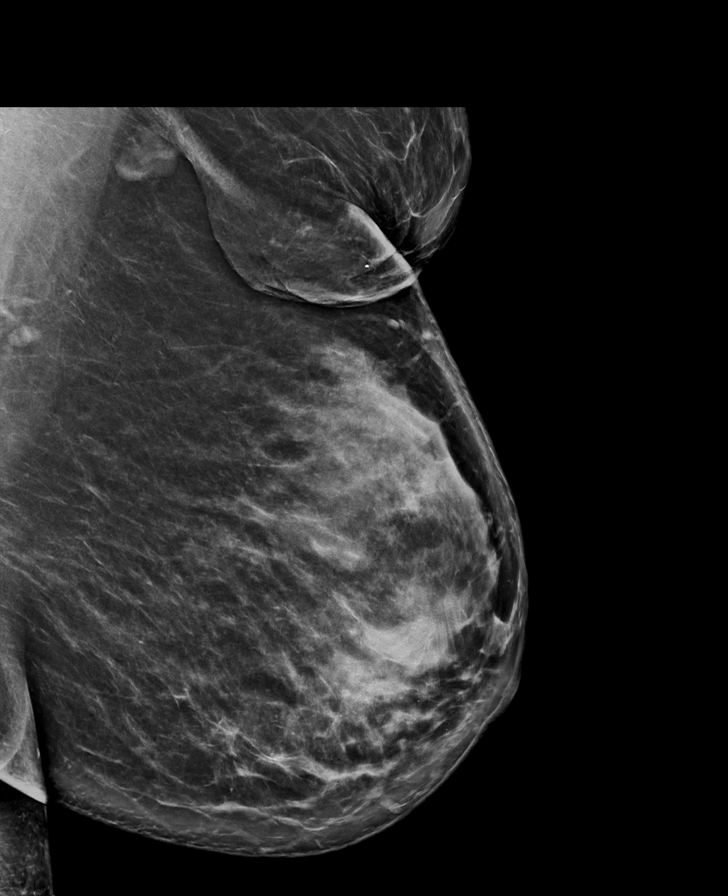

[R CC synth-2D (1 of 2)]
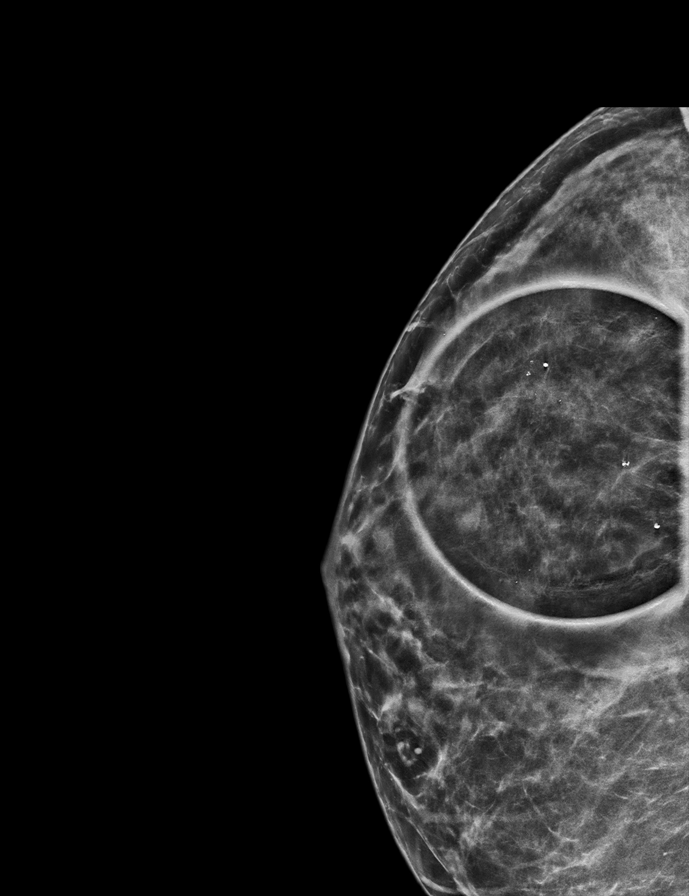

[R CC synth-2D (2 of 2)]
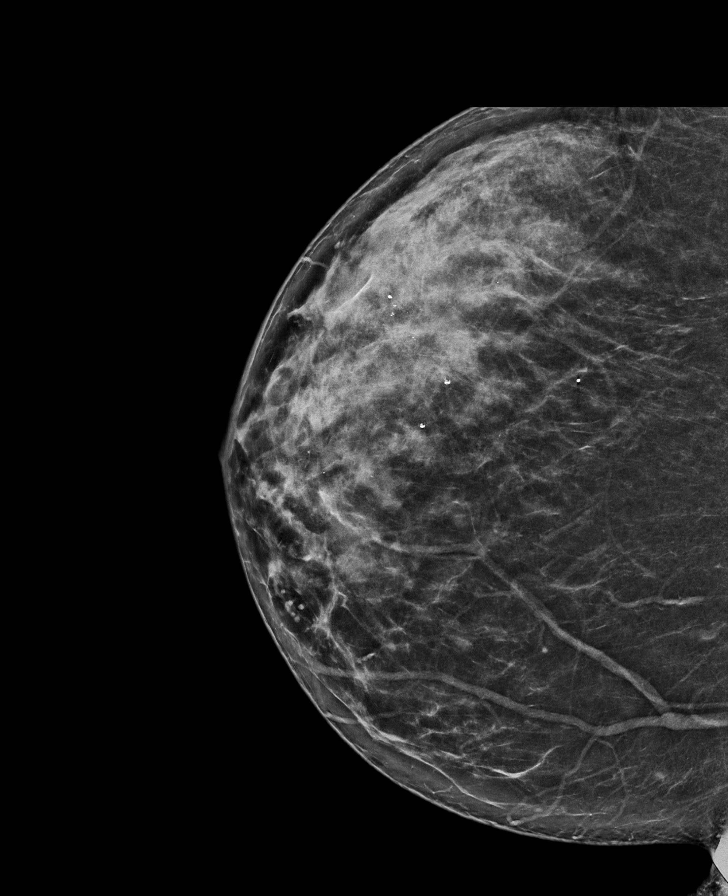

[R MLO synth-2D (1 of 2)]
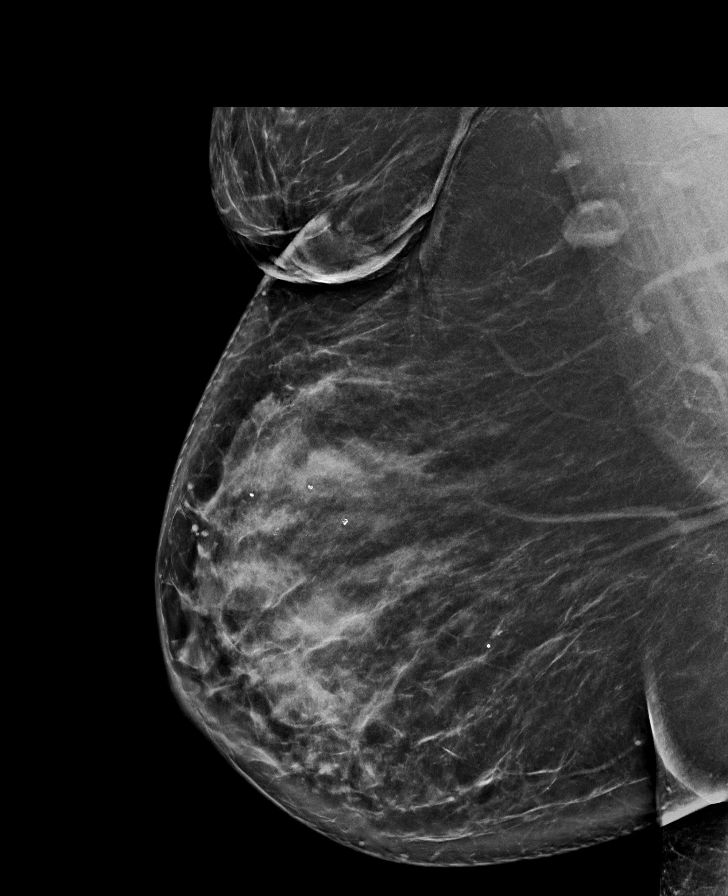

[R MLO synth-2D (2 of 2)]
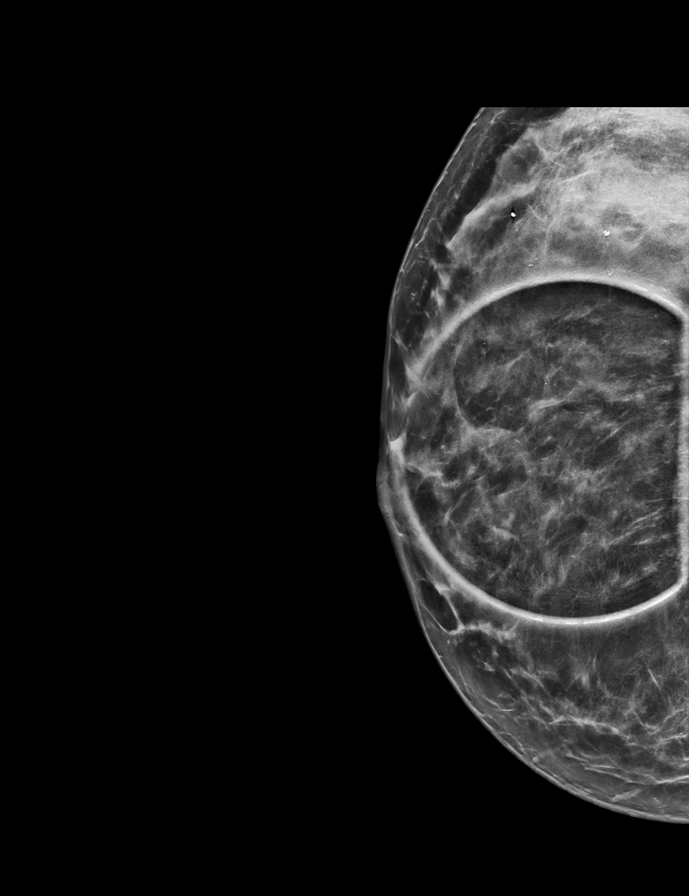

[R ML synth-2D]
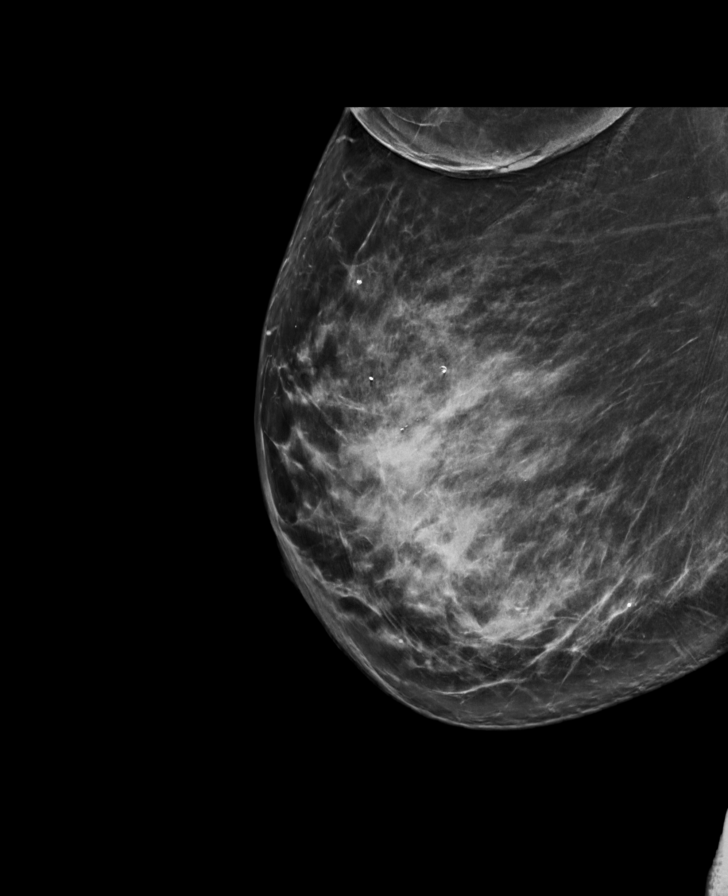

[8 of 40 positions shown; findings below may reference images not displayed]

ACR Breast Density Category c: The breast tissue is heterogeneously
dense, which may obscure small masses.
FINDINGS: Within the upper-outer right breast anterior depth there is a 1.4 cm
group of coarse heterogeneous calcifications, further evaluated with
magnification views. Additionally within the upper-outer right
breast anterior depth there is a small oval circumscribed mass. No
additional concerning findings within either breast.

Targeted ultrasound is performed, showing a 7 x 8 x 10 mm oval
circumscribed hypoechoic mass right breast 11 o'clock position 1 cm
from the nipple. No right axillary adenopathy.
IMPRESSION: Indeterminate right breast calcifications.

Right breast mass 11 o'clock position may represent complicated cyst
or fibroadenoma.

RECOMMENDATION:
As the patient is for evaluation prior to surgical procedure of the
breast, both of these areas need to be evaluated definitively with
biopsy.

Stereotactic guided core needle biopsy right breast calcifications
upper outer quadrant.

Ultrasound-guided aspiration versus biopsy right breast mass 11
o'clock position.

I have discussed the findings and recommendations with the patient.
If applicable, a reminder letter will be sent to the patient
regarding the next appointment.

BI-RADS CATEGORY  4: Suspicious.

## 2022-01-28 NOTE — Telephone Encounter (Addendum)
Resubmission Prior authorization BVQ:[id number] 0.25MG /0.5ML auto-injectors Via: Covermymeds/[employer] fax(resubmission) Case/Key:[id number] Status: pending as of 01/28/22 Reason: reason not given on covermymeds awaiting fax from insurance provider  for denial letter  Notified Pt via: Mychart

## 2022-02-02 ENCOUNTER — Other Ambulatory Visit: Payer: Self-pay | Admitting: Obstetrics & Gynecology

## 2022-02-08 ENCOUNTER — Ambulatory Visit (INDEPENDENT_AMBULATORY_CARE_PROVIDER_SITE_OTHER): Payer: BC Managed Care – PPO | Admitting: Obstetrics and Gynecology

## 2022-02-08 ENCOUNTER — Encounter: Payer: Self-pay | Admitting: Obstetrics and Gynecology

## 2022-02-08 VITALS — BP 129/83 | HR 72 | Resp 16 | Ht 67.0 in | Wt 290.0 lb

## 2022-02-08 DIAGNOSIS — N939 Abnormal uterine and vaginal bleeding, unspecified: Secondary | ICD-10-CM

## 2022-02-08 DIAGNOSIS — N8501 Benign endometrial hyperplasia: Secondary | ICD-10-CM | POA: Diagnosis not present

## 2022-02-08 MED ORDER — MEGESTROL ACETATE 40 MG PO TABS: 80.0000 mg | ORAL_TABLET | Freq: Every day | ORAL | 0 refills | Status: DC

## 2022-02-08 NOTE — Progress Notes (Signed)
RETURN GYNECOLOGY VISIT  Subjective:  Kimberly Mckay is a 41 y.o. with known endometrial hyperplasia without atypia presenting for pre operative evaluation.   Was first evaluated by Dr. Jolayne Panther 10/15/21 for long, heavy and painful periods. EMB on 12/28/21 c/w simple hyperplasia without atypia, cervical biopsy c/w endocervical polyp. Tried megace & provera without improvement in her bleeding. Tried IUD but was ultimately removed 01/25/22 due to severe pain and worsening of bleeding. Continues to have daily bleeding.   I personally reviewed the notes from Dr. Jolayne Panther on 10/15/21, Dr. Penne Lash on 01/18/22, and Dr. Charlotta Newton from 01/25/22. I reviewed her labs from 10/15/21 (Hgb 12.7, CMP WNL, A1c 5.5, TSH 1.91) and her biopsy/surgical pathology from 12/28/21 as noted above. I reviewed her ultrasound from 01/25/22 - 11.4 x 5.8 x 7.4cm ante ut, nml ovaries.  PMH: BMI 45, asthma, anemia, dep/anx PSH: breast reduction, denies any history of abdominal/pelvic surgery Meds: megace 80 BID, prevacid, albuterol, ibuprofen prn All: ASA (hives), stadol, sulfas, meperidine OB: SVDx2 Pap Hx:  01/18/22 NILM/HPV negative Soc: Denies t/e/d  Objective:   Vitals:   02/08/22 1025  BP: 129/83  Pulse: 72  Resp: 16  Weight: 290 lb (131.5 kg)  Height: 5\' 7"  (1.702 m)   General:  Alert, oriented and cooperative. Patient is in no acute distress.  Skin: Skin is warm and dry. No rash noted.   Cardiovascular: Normal heart rate noted  Respiratory: Normal respiratory effort, no problems with respiration noted   Assessment and Plan:  Kimberly Mckay is a 41 y.o. with G2P2 with AUB secondary to simple endometrial hyperplasia without atypia  1. Simple endometrial hyperplasia We reviewed management options for simple EH without atypia including progestins, IUD and hysterectomy. She has not been able to tolerate progestins/IUD and has not had relief in her symptoms, so she would like to move forward with definitive  management with hysterectomy.  - Discussed route of hysterectomy. Reviewed typically shorter operative time and recovery with vaginal hysterectomy versus laparoscopic. Focused conversation on opportunistic salpingectomy. Reviewed overall low risk of ovarian cancer, but evidence of risk reduction with salpingectomy. Discussed that I can easily and safely remove fallopian tubes laparoscopically, but that we may not be able to remove tubes vaginally if unable to adequately visualize and clamp for safe removal. After discussion, she desires opportunistic salpingectomy and would prefer a laparoscopic approach.   - We discussed ovarian conservation. Given her age, I recommended leaving ovaries in place. She is in agreement with this recommendation  - We discussed total vs supracervical hysterectomy. Given EH, I recommended removal of cervix to lower risk of persistent hyperplasia and bleeding. She is in agreement with this recommendation.  - Diagnosis: Endometrial hyperplasia - Planned surgery: TLH/BS - Risks of surgery include but are not limited to:  Bleeding - Can bleed enough to need transfusion or need for additional surgeries I.e. conversation to open surgery.  Infection Injury to surrounding organs/tissues (i.e. bowel/bladder/ureters, major blood vessels) Need for additional procedures - Would be specific to a complication or injury Wound complications Hospital re-admission Conversion to open surgery - reviewed some complications may necessitate or it may be the only way to complete the surgery.   VTE  Nerve injury or burns related to surgical equipment Medical complications of surgery/anesthesia including heart attack and stroke - We discussed postop restrictions, precautions and expectations - Preop testing needed:  per anesthesia recommendations - All questions answered - Megace refilled for bleeding control prior to surgery  42, MD

## 2022-02-22 DIAGNOSIS — D649 Anemia, unspecified: Secondary | ICD-10-CM

## 2022-02-22 HISTORY — DX: Anemia, unspecified: D64.9

## 2022-02-26 ENCOUNTER — Ambulatory Visit: Payer: BC Managed Care – PPO

## 2022-02-26 ENCOUNTER — Ambulatory Visit (INDEPENDENT_AMBULATORY_CARE_PROVIDER_SITE_OTHER): Payer: BC Managed Care – PPO | Admitting: Physician Assistant

## 2022-02-26 ENCOUNTER — Encounter: Payer: Self-pay | Admitting: Physician Assistant

## 2022-02-26 VITALS — BP 124/80 | HR 88 | Ht 67.0 in | Wt 286.0 lb

## 2022-02-26 DIAGNOSIS — Z01811 Encounter for preprocedural respiratory examination: Secondary | ICD-10-CM

## 2022-02-26 DIAGNOSIS — Z23 Encounter for immunization: Secondary | ICD-10-CM | POA: Diagnosis not present

## 2022-02-26 DIAGNOSIS — Z01818 Encounter for other preprocedural examination: Secondary | ICD-10-CM

## 2022-02-26 DIAGNOSIS — F419 Anxiety disorder, unspecified: Secondary | ICD-10-CM | POA: Diagnosis not present

## 2022-02-26 DIAGNOSIS — E559 Vitamin D deficiency, unspecified: Secondary | ICD-10-CM

## 2022-02-26 DIAGNOSIS — Z6841 Body Mass Index (BMI) 40.0 and over, adult: Secondary | ICD-10-CM

## 2022-02-26 DIAGNOSIS — D649 Anemia, unspecified: Secondary | ICD-10-CM | POA: Diagnosis not present

## 2022-02-26 DIAGNOSIS — R5383 Other fatigue: Secondary | ICD-10-CM

## 2022-02-26 MED ORDER — WEGOVY 0.5 MG/0.5ML ~~LOC~~ SOAJ: 0.5000 mg | SUBCUTANEOUS | 0 refills | Status: DC

## 2022-02-26 MED ORDER — WEGOVY 1 MG/0.5ML ~~LOC~~ SOAJ: 1.0000 mg | SUBCUTANEOUS | 0 refills | Status: DC

## 2022-02-26 NOTE — Patient Instructions (Signed)
Sent wegovy .5mg  and 1mg  to start in 1 month.

## 2022-02-26 NOTE — Progress Notes (Signed)
Established Patient Office Visit  Subjective   Patient ID: Kimberly Mckay, female    DOB: 10-May-1980  Age: 42 y.o. MRN: [id number]  Chief Complaint  Patient presents with   Follow-up    HPI Pt is a 42 yo obese female with anemia, mood disorder, endometrial hyperplasia, GAD, insulin resistance who presents to the clinic to follow up on start of wegovy and get surgical clearance for total hysterectomy on 04/07/2022.   Pt has done really well with wegovy. Starting weight was 292. Lost 6lbs in less than a month. No side effects.   She has surgery planned for hysterectomy for endometrial hyperplasia. Needs clearance.   .. Active Ambulatory Problems    Diagnosis Date Noted   Other fatigue 03/20/2020   SOBOE (shortness of breath on exertion) 03/20/2020   Mood disorder (HCC)- emotional eating 03/20/2020   Anemia 03/20/2020   Other constipation 03/20/2020   At risk for impaired metabolic function 03/20/2020   Vitamin D deficiency 04/02/2020   At risk for diabetes mellitus 04/02/2020   Absolute anemia 04/02/2020   Insulin resistance 04/15/2020   Class 3 severe obesity due to excess calories without serious comorbidity with body mass index (BMI) of 45.0 to 49.9 in adult [facility]) 04/15/2020   Back pain 04/18/2020   Neck pain 04/18/2020   Symptomatic mammary hypertrophy 04/18/2020   Polyphagia 06/16/2020   Endometrial hyperplasia, simple 01/18/2022   Generalized anxiety disorder 01/27/2022   Elevated blood pressure reading 01/27/2022   Resolved Ambulatory Problems    Diagnosis Date Noted   Prediabetes 03/20/2020   Past Medical History:  Diagnosis Date   Anxiety    Asthma    Complication of anesthesia    Constipation    COVID-19 04/2019   Depression    DJD (degenerative joint disease)    Eczema    Fibromyalgia    PONV (postoperative nausea and vomiting)    Swelling of both lower extremities      Review of Systems  All other systems reviewed and are negative.      Objective:     BP 124/80 (BP Location: Left Arm, Patient Position: Sitting, Cuff Size: Large)   Pulse 88   Ht 5\' 7"  (1.702 m)   Wt 286 lb (129.7 kg)   SpO2 99%   BMI 44.79 kg/m  BP Readings from Last 3 Encounters:  02/26/22 124/80  02/08/22 129/83  01/27/22 (!) 130/112   Wt Readings from Last 3 Encounters:  02/26/22 286 lb (129.7 kg)  02/08/22 290 lb (131.5 kg)  01/27/22 292 lb (132.5 kg)  .Marland Kitchen    01/28/2022    9:14 AM 03/20/2020   11:18 AM  Depression screen PHQ 2/9  Decreased Interest 2 3  Down, Depressed, Hopeless 2 3  PHQ - 2 Score 4 6  Altered sleeping 1 2  Tired, decreased energy 1 3  Change in appetite 1 3  Feeling bad or failure about yourself  1 3  Trouble concentrating 1 3  Moving slowly or fidgety/restless 2 3  Suicidal thoughts 1 0  PHQ-9 Score 12 23  Difficult doing work/chores Somewhat difficult Very difficult   .Marland Kitchen    01/28/2022    9:15 AM  GAD 7 : Generalized Anxiety Score  Nervous, Anxious, on Edge 3  Control/stop worrying 3  Worry too much - different things 3  Trouble relaxing 3  Restless 3  Easily annoyed or irritable 1  Afraid - awful might happen 3  Total GAD 7 Score 19  Anxiety Difficulty Somewhat difficult    EKG- NSR, no ST elevation or depression.     Physical Exam Constitutional:      Appearance: Normal appearance. She is obese.  HENT:     Head: Normocephalic.  Cardiovascular:     Rate and Rhythm: Normal rate and regular rhythm.  Neurological:     General: No focal deficit present.     Mental Status: She is alert and oriented to person, place, and time.  Psychiatric:        Mood and Affect: Mood normal.         Assessment & Plan:  Marland KitchenMarland KitchenPrecilla was seen today for follow-up.  Diagnoses and all orders for this visit:  Pre-op evaluation -     EKG 12-Lead  Class 3 severe obesity with serious comorbidity and body mass index (BMI) of 40.0 to 44.9 in adult, unspecified obesity type (HCC) -     WEGOVY 0.5 MG/0.5ML SOAJ;  Inject 0.5 mg into the skin once a week. Use this dose for 1 month (4 shots) and then increase to next higher dose. -     WEGOVY 1 MG/0.5ML SOAJ; Inject 1 mg into the skin once a week. Use this dose for 1 month (4 shots) and then increase to next higher dose. -     COMPLETE METABOLIC PANEL WITH GFR  Anemia, unspecified type -     B12 and Folate Panel -     COMPLETE METABOLIC PANEL WITH GFR -     CBC w/Diff/Platelet -     Fe+TIBC+Fer  Vitamin D deficiency -     VITAMIN D 25 Hydroxy (Vit-D Deficiency, Fractures)  No energy -     VITAMIN D 25 Hydroxy (Vit-D Deficiency, Fractures) -     B12 and Folate Panel  Preop examination -     VITAMIN D 25 Hydroxy (Vit-D Deficiency, Fractures) -     B12 and Folate Panel -     COMPLETE METABOLIC PANEL WITH GFR -     CBC w/Diff/Platelet -     Protime-INR -     Fe+TIBC+Fer  Need for Tdap vaccination -     Tdap vaccine greater than or equal to 7yo IM  Anxiety   Increased wegovy dosages Continue with diet and exercise Follow up in 3 months  EKG no changes, no acute abnormalities Pre-surgery labs  Pt declined flu shot Tdap given Vitals look great Cleared for surgery after lab results reviewed.   Return in about 3 months (around 05/28/2022) for follow up 3 months weight.    Tandy Gaw, PA-C

## 2022-02-27 LAB — COMPLETE METABOLIC PANEL WITH GFR
AG Ratio: 1.2 (calc) (ref 1.0–2.5)
ALT: 13 U/L (ref 6–29)
AST: 17 U/L (ref 10–30)
Albumin: 4.3 g/dL (ref 3.6–5.1)
Alkaline phosphatase (APISO): 95 U/L (ref 31–125)
BUN: 13 mg/dL (ref 7–25)
CO2: 24 mmol/L (ref 20–32)
Calcium: 9.7 mg/dL (ref 8.6–10.2)
Chloride: 107 mmol/L (ref 98–110)
Creat: 0.92 mg/dL (ref 0.50–0.99)
Globulin: 3.5 g/dL (calc) (ref 1.9–3.7)
Glucose, Bld: 92 mg/dL (ref 65–99)
Potassium: 4.2 mmol/L (ref 3.5–5.3)
Sodium: 141 mmol/L (ref 135–146)
Total Bilirubin: 0.3 mg/dL (ref 0.2–1.2)
Total Protein: 7.8 g/dL (ref 6.1–8.1)
eGFR: 81 mL/min/{1.73_m2} (ref >=60)

## 2022-02-27 LAB — CBC WITH DIFFERENTIAL/PLATELET
Absolute Monocytes: 485 cells/uL (ref 200–950)
Basophils Absolute: 60 cells/uL (ref 0–200)
Basophils Relative: 0.7 %
Eosinophils Absolute: 128 cells/uL (ref 15–500)
Eosinophils Relative: 1.5 %
HCT: 38.9 % (ref 35.0–45.0)
Hemoglobin: 12.5 g/dL (ref 11.7–15.5)
Lymphs Abs: 1981 cells/uL (ref 850–3900)
MCH: 24.5 pg — ABNORMAL LOW (ref 27.0–33.0)
MCHC: 32.1 g/dL (ref 32.0–36.0)
MCV: 76.1 fL — ABNORMAL LOW (ref 80.0–100.0)
MPV: 12.2 fL (ref 7.5–12.5)
Monocytes Relative: 5.7 %
Neutro Abs: 5848 cells/uL (ref 1500–7800)
Neutrophils Relative %: 68.8 %
Platelets: 295 10*3/uL (ref 140–400)
RBC: 5.11 10*6/uL — ABNORMAL HIGH (ref 3.80–5.10)
RDW: 15.1 % — ABNORMAL HIGH (ref 11.0–15.0)
Total Lymphocyte: 23.3 %
WBC: 8.5 10*3/uL (ref 3.8–10.8)

## 2022-02-27 LAB — IRON,TIBC AND FERRITIN PANEL
%SAT: 8 % (calc) — ABNORMAL LOW (ref 16–45)
Ferritin: 29 ng/mL (ref 16–154)
Iron: 26 ug/dL — ABNORMAL LOW (ref 40–190)
TIBC: 344 mcg/dL (calc) (ref 250–450)

## 2022-02-27 LAB — PROTIME-INR
INR: 1
Prothrombin Time: 10.3 s (ref 9.0–11.5)

## 2022-02-27 LAB — VITAMIN D 25 HYDROXY (VIT D DEFICIENCY, FRACTURES): Vit D, 25-Hydroxy: 19 ng/mL — ABNORMAL LOW (ref 30–100)

## 2022-02-27 LAB — B12 AND FOLATE PANEL
Folate: 4.7 ng/mL — ABNORMAL LOW
Vitamin B-12: 343 pg/mL (ref 200–1100)

## 2022-03-01 ENCOUNTER — Encounter: Payer: Self-pay | Admitting: Obstetrics and Gynecology

## 2022-03-01 ENCOUNTER — Encounter: Payer: Self-pay | Admitting: Physician Assistant

## 2022-03-01 MED ORDER — FERROUS SULFATE 325 (65 FE) MG PO TBEC: 325.0000 mg | DELAYED_RELEASE_TABLET | Freq: Every day | ORAL | 1 refills | Status: DC

## 2022-03-01 MED ORDER — VITAMIN D (ERGOCALCIFEROL) 1.25 MG (50000 UNIT) PO CAPS: 50000.0000 [IU] | ORAL_CAPSULE | ORAL | 1 refills | Status: DC

## 2022-03-01 NOTE — Progress Notes (Signed)
Vitamin D low. How much are you taking daily?  Ok to start folic acid supplement [id number] Hemoglobin good Serum iron low. I would consider iron supplement in morning with breakfast Kidney, liver, glucose look great.   Cleared for surgery. Can we write up a letter JJ to send to surgeon.

## 2022-03-02 ENCOUNTER — Encounter: Payer: Self-pay | Admitting: Neurology

## 2022-03-03 ENCOUNTER — Ambulatory Visit (INDEPENDENT_AMBULATORY_CARE_PROVIDER_SITE_OTHER): Payer: BC Managed Care – PPO | Admitting: Obstetrics and Gynecology

## 2022-03-03 ENCOUNTER — Encounter: Payer: Self-pay | Admitting: Obstetrics and Gynecology

## 2022-03-03 VITALS — BP 130/84 | HR 91 | Ht 67.0 in | Wt 280.0 lb

## 2022-03-03 DIAGNOSIS — N946 Dysmenorrhea, unspecified: Secondary | ICD-10-CM | POA: Diagnosis not present

## 2022-03-03 DIAGNOSIS — N921 Excessive and frequent menstruation with irregular cycle: Secondary | ICD-10-CM

## 2022-03-03 MED ORDER — IBUPROFEN 800 MG PO TABS: 800.0000 mg | ORAL_TABLET | Freq: Three times a day (TID) | ORAL | 0 refills | Status: DC | PRN

## 2022-03-03 NOTE — Progress Notes (Signed)
Pt has had severe back and uterine pain for 3 days.  Today she has bled through her pants.

## 2022-03-03 NOTE — Progress Notes (Signed)
42 yo P2 presenting today for evaluation of menorrhagia and severe back pain for the past 3-4 days. The pain starts in the lower pelvis and radiates to her lower back and upper thigh region. She has bled through her pants today. She has been taking megace daily and will pick up the new prescription with BID dosing. Patient states her pain is not relieved by ibuprofen. Patient was recently diagnosed with hyperplasia without atypia and failed medical management with Mirena IUD. She is scheduled for TLH in February.  Past Medical History:  Diagnosis Date   Anemia    Anxiety    Asthma    Back pain    Complication of anesthesia    pt aspirated following upper GI   Constipation    COVID-19 04/2019   Depression    DJD (degenerative joint disease)    Eczema    Fibromyalgia    PONV (postoperative nausea and vomiting)    Prediabetes    SOBOE (shortness of breath on exertion)    Swelling of both lower extremities    Past Surgical History:  Procedure Laterality Date   BREAST CYST EXCISION Bilateral 01/29/2021   Procedure: Excision of accessory axillary tissue;  Surgeon: Wallace Going, DO;  Location: [facility];  Service: Plastics;  Laterality: Bilateral;  1.5 hours   BREAST REDUCTION SURGERY Bilateral 09/03/2020   Procedure: BILATERAL BREAST REDUCTION WITH LIPOSUCTION;  Surgeon: Wallace Going, DO;  Location: [facility];  Service: Plastics;  Laterality: Bilateral;  4 hours total   UPPER GI ENDOSCOPY     pt states she aspirated with this procedure   WISDOM TOOTH EXTRACTION     Family History  Problem Relation Age of Onset   Diabetes Mother    Heart disease Mother    Cancer Mother    Thyroid disease Mother    Obesity Mother    Hypertension Father    Hyperlipidemia Father    Alcoholism Father    Social History   Tobacco Use   Smoking status: Former    Packs/day: 0.50    Years: 20.00    Total pack years: 10.00    Types: Cigarettes    Quit  date: 2019    Years since quitting: 5.0   Smokeless tobacco: Never  Vaping Use   Vaping Use: Never used  Substance Use Topics   Alcohol use: Not Currently   Drug use: No   ROS See pertinent in HPI. All other systems reviewed and non contributory Blood pressure 130/84, pulse 91, height 5\' 7"  (1.702 m), weight 280 lb (127 kg). GENERAL: Well-developed, well-nourished female in no acute distress. Pants with evidence of blood stain in front and back NEURO: alert and oriented x 3  A/P 42 yo with menorrhagia and significant dysmenorrhea - Advised patient to continue with Megace as prescribed - offered trial of Lysteda which patient declined - offered Tramadol for pain management and patient declined - Will try to move up surgical intervention if possible

## 2022-03-08 ENCOUNTER — Encounter: Payer: Self-pay | Admitting: Physician Assistant

## 2022-03-08 ENCOUNTER — Encounter: Payer: Self-pay | Admitting: Obstetrics and Gynecology

## 2022-03-10 ENCOUNTER — Other Ambulatory Visit: Payer: Self-pay | Admitting: Obstetrics and Gynecology

## 2022-03-10 ENCOUNTER — Telehealth: Payer: Self-pay | Admitting: Physician Assistant

## 2022-03-10 DIAGNOSIS — Z01818 Encounter for other preprocedural examination: Secondary | ICD-10-CM

## 2022-03-10 NOTE — Addendum Note (Signed)
Addended by: Gale Journey on: 03/10/2022 01:46 PM   Modules accepted: Orders

## 2022-03-10 NOTE — Telephone Encounter (Signed)
Contacted patient to reschedule upcoming appointment on 05/28/2022 with Iran Planas, PA. Pt. Wants to know if she should come in sooner than 05/31/2022 since she will run out of the [place] 1 MG/0.5ML SOAJ  injections about 04/27/2022. Please advise.

## 2022-03-11 NOTE — Telephone Encounter (Signed)
Contacted patient and rescheduled appointment for 04/20/2022 at 8:10am with Iran Planas, PA.

## 2022-03-19 ENCOUNTER — Encounter: Payer: Self-pay | Admitting: Emergency Medicine

## 2022-03-19 ENCOUNTER — Ambulatory Visit
Admission: EM | Admit: 2022-03-19 | Discharge: 2022-03-19 | Disposition: A | Discharge status: Home / Self Care | Payer: BC Managed Care – PPO | Attending: Urgent Care | Admitting: Urgent Care

## 2022-03-19 DIAGNOSIS — H0100A Unspecified blepharitis right eye, upper and lower eyelids: Secondary | ICD-10-CM | POA: Diagnosis not present

## 2022-03-19 DIAGNOSIS — H1031 Unspecified acute conjunctivitis, right eye: Secondary | ICD-10-CM

## 2022-03-19 MED ORDER — TOBRAMYCIN 0.3 % OP SOLN: 1.0000 [drp] | OPHTHALMIC | 0 refills | Status: DC

## 2022-03-19 NOTE — ED Provider Notes (Signed)
[facility]  Note:  This document was prepared using Dragon voice recognition software and may include unintentional dictation errors.  MRN: [id number] DOB: 1980/07/13  Subjective:   Kimberly Mckay is a 42 y.o. female presenting for 1 day history of acute onset persistent eye irritation, mucus and drainage. Patient wears contacts, wore them overnight. No eye trauma, photophobia, vision changes.  She does have a gritty sensation of her eye.  No current facility-administered medications for this encounter.  Current Outpatient Medications:    albuterol (VENTOLIN HFA) 108 (90 Base) MCG/ACT inhaler, Inhale into the lungs., Disp: , Rfl:    ALPRAZolam (XANAX) 0.5 MG tablet, Take 0.5 mg by mouth at bedtime as needed for anxiety., Disp: , Rfl:    ferrous sulfate 325 (65 FE) MG EC tablet, Take 1 tablet (325 mg total) by mouth daily with breakfast., Disp: 90 tablet, Rfl: 1   ibuprofen (ADVIL) 800 MG tablet, Take 1 tablet (800 mg total) by mouth every 8 (eight) hours as needed., Disp: 30 tablet, Rfl: 0   lansoprazole (PREVACID) 15 MG capsule, Take 15 mg by mouth daily at 12 noon., Disp: , Rfl:    megestrol (MEGACE) 40 MG tablet, Take 2 tablets (80 mg total) by mouth daily., Disp: 180 tablet, Rfl: 0   multivitamin (VIT W/EXTRA C) CHEW chewable tablet, Chew by mouth., Disp: , Rfl:    Vitamin D, Ergocalciferol, (DRISDOL) 1.25 MG (50000 UNIT) CAPS capsule, Take 1 capsule (50,000 Units total) by mouth every 7 (seven) days., Disp: 12 capsule, Rfl: 1   WEGOVY 0.25 MG/0.5ML SOAJ, Inject 0.25 mg into the skin once a week. Use this dose for 1 month (4 shots) and then increase to next higher dose., Disp: 2 mL, Rfl: 0   WEGOVY 0.5 MG/0.5ML SOAJ, Inject 0.5 mg into the skin once a week. Use this dose for 1 month (4 shots) and then increase to next higher dose., Disp: 2 mL, Rfl: 0   [START ON 03/29/2022] WEGOVY 1 MG/0.5ML SOAJ, Inject 1 mg into the skin once a week. Use this dose for 1 month (4  shots) and then increase to next higher dose., Disp: 2 mL, Rfl: 0   Allergies  Allergen Reactions   Aspirin Hives   Butorphanol Other (See Comments)    Other reaction(s): Psychosis (intolerance) Hallucinations    Propoxyphene Hives   Sulfa Antibiotics Nausea And Vomiting   Meperidine Rash    Hives      Past Medical History:  Diagnosis Date   Anemia    Anxiety    Asthma    Back pain    Complication of anesthesia    pt aspirated following upper GI   Constipation    COVID-19 04/2019   Depression    DJD (degenerative joint disease)    Eczema    Fibromyalgia    PONV (postoperative nausea and vomiting)    Prediabetes    SOBOE (shortness of breath on exertion)    Swelling of both lower extremities      Past Surgical History:  Procedure Laterality Date   BREAST CYST EXCISION Bilateral 01/29/2021   Procedure: Excision of accessory axillary tissue;  Surgeon: Wallace Going, DO;  Location: [facility];  Service: Plastics;  Laterality: Bilateral;  1.5 hours   BREAST REDUCTION SURGERY Bilateral 09/03/2020   Procedure: BILATERAL BREAST REDUCTION WITH LIPOSUCTION;  Surgeon: Wallace Going, DO;  Location: [facility];  Service: Plastics;  Laterality: Bilateral;  4 hours total  UPPER GI ENDOSCOPY     pt states she aspirated with this procedure   WISDOM TOOTH EXTRACTION      Family History  Problem Relation Age of Onset   Diabetes Mother    Heart disease Mother    Cancer Mother    Thyroid disease Mother    Obesity Mother    Hypertension Father    Hyperlipidemia Father    Alcoholism Father     Social History   Tobacco Use   Smoking status: Former    Packs/day: 0.50    Years: 20.00    Total pack years: 10.00    Types: Cigarettes    Quit date: 2019    Years since quitting: 5.0   Smokeless tobacco: Never  Vaping Use   Vaping Use: Never used  Substance Use Topics   Alcohol use: Not Currently   Drug use: No     ROS   Objective:   Vitals: BP (!) 128/91 (BP Location: Right Arm)   Pulse 83   Temp 98.4 F (36.9 C) (Oral)   Resp 16   SpO2 98%   Physical Exam Constitutional:      General: She is not in acute distress.    Appearance: Normal appearance. She is well-developed. She is not ill-appearing, toxic-appearing or diaphoretic.  HENT:     Head: Normocephalic and atraumatic.     Nose: Nose normal.     Mouth/Throat:     Mouth: Mucous membranes are moist.  Eyes:     General: Lids are normal. Lids are everted, no foreign bodies appreciated. Vision grossly intact. No scleral icterus.       Right eye: No foreign body, discharge or hordeolum.        Left eye: No foreign body, discharge or hordeolum.     Extraocular Movements: Extraocular movements intact.     Right eye: Normal extraocular motion.     Left eye: Normal extraocular motion and no nystagmus.     Conjunctiva/sclera:     Right eye: Right conjunctiva is injected (with associated dried mucus of both upper and lower eyelashes). No chemosis, exudate or hemorrhage.    Left eye: Left conjunctiva is not injected. No chemosis, exudate or hemorrhage. Cardiovascular:     Rate and Rhythm: Normal rate.  Pulmonary:     Effort: Pulmonary effort is normal.  Skin:    General: Skin is warm and dry.  Neurological:     General: No focal deficit present.     Mental Status: She is alert and oriented to person, place, and time.  Psychiatric:        Mood and Affect: Mood normal.        Behavior: Behavior normal.    Eye Exam: Eyelids everted and swept for foreign body. The eye was anesthetized with 2 drops of tetracaine and stained with fluorescein. Examination under woods lamp does not reveal a foreign body or area of increased stain uptake. The eye was then irrigated copiously with saline.  Assessment and Plan :   PDMP not reviewed this encounter.  1. Acute bacterial conjunctivitis of right eye   2. Blepharitis of both upper and lower  eyelid of right eye, unspecified type     Will start tobramycin to address bacterial conjunctivitis and blepharitis of the right eye. Counseled patient on potential for adverse effects with medications prescribed/recommended today, ER and return-to-clinic precautions discussed, patient verbalized understanding.    Jaynee Eagles, PA-C 03/19/22 1521

## 2022-03-19 NOTE — ED Triage Notes (Signed)
Right eye pain and itching starting this morning with new drainage. Does wear contacts, and did sleep with one in last night. States it does mostly itch, does occasionally feel a gritty feeling. Denies visual changes

## 2022-03-20 ENCOUNTER — Telehealth: Payer: Self-pay

## 2022-03-20 NOTE — Telephone Encounter (Signed)
Called patient to follow up regarding recent urgent care visit. Patient was unavailable on attempt to contact.

## 2022-03-24 ENCOUNTER — Other Ambulatory Visit: Payer: Self-pay

## 2022-03-24 ENCOUNTER — Encounter (HOSPITAL_BASED_OUTPATIENT_CLINIC_OR_DEPARTMENT_OTHER): Payer: Self-pay | Admitting: Obstetrics and Gynecology

## 2022-03-24 NOTE — Progress Notes (Signed)
Your procedure is scheduled on Wednesday, 04/07/2022.  Report to [facility] AT  6:30 AM.   Call this number if you have problems the morning of surgery  [phone].   OUR ADDRESS IS [address].  WE ARE LOCATED IN [facility].  PLEASE BRING YOUR INSURANCE CARD AND PHOTO ID DAY OF SURGERY.  ONLY 2 PEOPLE ARE ALLOWED IN  WAITING  ROOM / CURRENTLY NO ONE UNDER AGE 41                                     REMEMBER:  DO NOT EAT FOOD, CANDY GUM OR MINTS  AFTER MIDNIGHT THE NIGHT BEFORE YOUR SURGERY . YOU MAY HAVE CLEAR LIQUIDS FROM MIDNIGHT THE NIGHT BEFORE YOUR SURGERY UNTIL  5:30 AM. NO CLEAR LIQUIDS AFTER   5:30 AM DAY OF SURGERY.  YOU MAY  BRUSH YOUR TEETH MORNING OF SURGERY AND RINSE YOUR MOUTH OUT, NO CHEWING GUM CANDY OR MINTS.     CLEAR LIQUID DIET    Allowed      Water                                                                   Coffee and tea, regular and decaf  (NO cream or milk products of any type, may sweeten)                         Carbonated beverages, regular and diet                                    Sports drinks like Gatorade _____________________________________________________________________     TAKE ONLY THESE MEDICATIONS MORNING OF SURGERY: Albuterol inhaler if needed, Prevacid, Xanax if needed Please bring inhaler with you on day of surgery. Hold Wegovy x 1 week prior to surgery. Last dose before surgery should be on 03/30/22.   UP TO 4 VISITORS  MAY VISIT IN THE EXTENDED RECOVERY ROOM UNTIL 800 PM ONLY.  ONE  VISITOR AGE 54 AND OVER MAY SPEND THE NIGHT AND MUST BE IN EXTENDED RECOVERY ROOM NO LATER THAN 800 PM . YOUR DISCHARGE TIME AFTER YOU SPEND THE NIGHT IS 900 AM THE MORNING AFTER YOUR SURGERY.  YOU MAY PACK A SMALL OVERNIGHT BAG WITH TOILETRIES FOR YOUR OVERNIGHT STAY IF YOU WISH.  YOUR PRESCRIPTION MEDICATIONS WILL BE PROVIDED DURING [place].                                      DO  NOT WEAR JEWERLY/  METAL/  PIERCINGS (INCLUDING NO PLASTIC PIERCINGS) DO NOT WEAR LOTIONS, POWDERS, PERFUMES OR NAIL POLISH ON YOUR FINGERNAILS. TOENAIL POLISH IS OK TO WEAR. DO NOT SHAVE FOR 48 HOURS PRIOR TO DAY OF SURGERY.  CONTACTS, GLASSES, OR DENTURES MAY NOT BE WORN TO SURGERY.  REMEMBER: NO SMOKING, VAPING ,  DRUGS OR ALCOHOL FOR 24 HOURS BEFORE YOUR SURGERY.  [facility] IS NOT RESPONSIBLE  FOR ANY BELONGINGS.                                                                    Marland Kitchen           [facility] - Preparing for Surgery Before surgery, you can play an important role.  Because skin is not sterile, your skin needs to be as free of germs as possible.  You can reduce the number of germs on your skin by washing with CHG (chlorahexidine gluconate) soap before surgery.  CHG is an antiseptic cleaner which kills germs and bonds with the skin to continue killing germs even after washing. Please DO NOT use if you have an allergy to CHG or antibacterial soaps.  If your skin becomes reddened/irritated stop using the CHG and inform your nurse when you arrive at Short Stay. Do not shave (including legs and underarms) for at least 48 hours prior to the first CHG shower.  You may shave your face/neck. Please follow these instructions carefully:  1.  Shower with CHG Soap the night before surgery and the  morning of Surgery.  2.  If you choose to wash your hair, wash your hair first as usual with your  normal  shampoo.  3.  After you shampoo, rinse your hair and body thoroughly to remove the  shampoo.                                        4.  Use CHG as you would any other liquid soap.  You can apply chg directly  to the skin and wash , chg soap provided, night before and morning of your surgery.  5.  Apply the CHG Soap to your body ONLY FROM THE NECK DOWN.   Do not use on face/ open                           Wound or open sores. Avoid contact with eyes, ears mouth and  genitals (private parts).                       Wash face,  Genitals (private parts) with your normal soap.             6.  Wash thoroughly, paying special attention to the area where your surgery  will be performed.  7.  Thoroughly rinse your body with warm water from the neck down.  8.  DO NOT shower/wash with your normal soap after using and rinsing off  the CHG Soap.             9.  Pat yourself dry with a clean towel.            10.  Wear clean pajamas.            11.  Place clean sheets on your bed the night of your first shower and do not  sleep with pets. Day of Surgery : Do not apply any lotions/ powders the morning of surgery.  Please wear clean clothes to the hospital/surgery  center.  IF YOU HAVE ANY SKIN IRRITATION OR PROBLEMS WITH THE SURGICAL SOAP, PLEASE GET A BAR OF GOLD DIAL SOAP AND SHOWER THE NIGHT BEFORE YOUR SURGERY AND THE MORNING OF YOUR SURGERY. PLEASE LET THE NURSE KNOW MORNING OF YOUR SURGERY IF YOU HAD ANY PROBLEMS WITH THE SURGICAL SOAP.   ________________________________________________________________________                                                        QUESTIONS [facility] PRE OP NURSE PHONE [phone].

## 2022-03-24 NOTE — Progress Notes (Signed)
Spoke w/ via phone for pre-op interview---[facility] needs dos----urine pregnancy              Lab results------04/05/22 lab appt for cbc, type & screen COVID test -----patient states asymptomatic no test needed Arrive at -------0630 on Wednesday, 04/07/2022 NPO after MN NO Solid Food.  Clear liquids from MN until---0530 Med rec completed Medications to take morning of surgery -----Albuterol inhaler prn, Prevacid, Xanax prn Diabetic medication -----Patient instructed to hold Wegovy x 1 week. Last dose of Wegovy before surgery should be on 03/30/22. Patient instructed no nail polish to be worn day of surgery Patient instructed to bring photo id and insurance card day of surgery Patient aware to have Driver (ride ) / caregiver    for 24 hours after surgery - sister-in-law will be driver, husband will be caregiver Patient Special Instructions -----Extended / overnight stay instructions given. Bring inhaler on day of surgery. Pre-Op special Istructions -----none Patient verbalized understanding of instructions that were given at this phone interview. Patient denies shortness of breath, chest pain, fever, cough at this phone interview.

## 2022-04-05 ENCOUNTER — Encounter (HOSPITAL_COMMUNITY)
Admission: RE | Admit: 2022-04-05 | Discharge: 2022-04-05 | Disposition: A | Discharge status: Home / Self Care | Payer: BC Managed Care – PPO | Source: Ambulatory Visit | Attending: Obstetrics and Gynecology | Admitting: Obstetrics and Gynecology

## 2022-04-05 ENCOUNTER — Encounter: Payer: Self-pay | Admitting: Obstetrics and Gynecology

## 2022-04-05 DIAGNOSIS — Z01812 Encounter for preprocedural laboratory examination: Secondary | ICD-10-CM | POA: Insufficient documentation

## 2022-04-05 DIAGNOSIS — Z01818 Encounter for other preprocedural examination: Secondary | ICD-10-CM

## 2022-04-05 LAB — CBC
HCT: 34.9 % — ABNORMAL LOW (ref 36.0–46.0)
Hemoglobin: 10 g/dL — ABNORMAL LOW (ref 12.0–15.0)
MCH: 22 pg — ABNORMAL LOW (ref 26.0–34.0)
MCHC: 28.7 g/dL — ABNORMAL LOW (ref 30.0–36.0)
MCV: 76.7 fL — ABNORMAL LOW (ref 80.0–100.0)
Platelets: 290 10*3/uL (ref 150–400)
RBC: 4.55 MIL/uL (ref 3.87–5.11)
RDW: 16.1 % — ABNORMAL HIGH (ref 11.5–15.5)
WBC: 6.8 10*3/uL (ref 4.0–10.5)
nRBC: 0 % (ref 0.0–0.2)

## 2022-04-05 NOTE — Progress Notes (Addendum)
I routed 04/05/22 CBC results (Hgb 10.0) to Dr. Mardee Postin.

## 2022-04-06 NOTE — Anesthesia Preprocedure Evaluation (Signed)
Anesthesia Evaluation  Patient identified by MRN, date of birth, ID band Patient awake    Reviewed: Allergy & Precautions, NPO status , Patient's Chart, lab work & pertinent test results  History of Anesthesia Complications (+) PONV and history of anesthetic complications  Airway Mallampati: II  TM Distance: >3 FB Neck ROM: Full    Dental no notable dental hx.    Pulmonary asthma , former smoker   Pulmonary exam normal        Cardiovascular negative cardio ROS Normal cardiovascular exam     Neuro/Psych   Anxiety Depression    negative neurological ROS     GI/Hepatic Neg liver ROS,GERD  Medicated,,  Endo/Other    Morbid obesity (BMI 43, on Wegovy)  Renal/GU negative Renal ROS  negative genitourinary   Musculoskeletal  (+)  Fibromyalgia -  Abdominal   Peds  Hematology negative hematology ROS (+)   Anesthesia Other Findings Day of surgery medications reviewed with patient.  Reproductive/Obstetrics Endometrial hyperplasia                              Anesthesia Physical Anesthesia Plan  ASA: 3  Anesthesia Plan: General   Post-op Pain Management: Tylenol PO (pre-op)* and Toradol IV (intra-op)*   Induction: Intravenous  PONV Risk Score and Plan: 4 or greater and Treatment may vary due to age or medical condition, Ondansetron, Dexamethasone, Midazolam and Scopolamine patch - Pre-op  Airway Management Planned: Oral ETT  Additional Equipment: None  Intra-op Plan:   Post-operative Plan: Extubation in OR  Informed Consent: I have reviewed the patients History and Physical, chart, labs and discussed the procedure including the risks, benefits and alternatives for the proposed anesthesia with the patient or authorized representative who has indicated his/her understanding and acceptance.     Dental advisory given  Plan Discussed with: CRNA  Anesthesia Plan Comments:          Anesthesia Quick Evaluation

## 2022-04-07 ENCOUNTER — Other Ambulatory Visit (HOSPITAL_COMMUNITY): Payer: Self-pay

## 2022-04-07 ENCOUNTER — Ambulatory Visit (HOSPITAL_BASED_OUTPATIENT_CLINIC_OR_DEPARTMENT_OTHER): Payer: BC Managed Care – PPO | Admitting: Anesthesiology

## 2022-04-07 ENCOUNTER — Other Ambulatory Visit: Payer: Self-pay

## 2022-04-07 ENCOUNTER — Observation Stay (HOSPITAL_BASED_OUTPATIENT_CLINIC_OR_DEPARTMENT_OTHER)
Admission: RE | Admit: 2022-04-07 | Discharge: 2022-04-08 | Disposition: A | Discharge status: Home / Self Care | Payer: BC Managed Care – PPO | Attending: Obstetrics and Gynecology | Admitting: Obstetrics and Gynecology

## 2022-04-07 ENCOUNTER — Encounter (HOSPITAL_BASED_OUTPATIENT_CLINIC_OR_DEPARTMENT_OTHER)
Admission: RE | Disposition: A | Discharge status: Home / Self Care | Payer: Self-pay | Source: Home / Self Care | Attending: Obstetrics and Gynecology

## 2022-04-07 ENCOUNTER — Encounter (HOSPITAL_BASED_OUTPATIENT_CLINIC_OR_DEPARTMENT_OTHER): Payer: Self-pay | Admitting: Obstetrics and Gynecology

## 2022-04-07 DIAGNOSIS — J45909 Unspecified asthma, uncomplicated: Secondary | ICD-10-CM | POA: Diagnosis not present

## 2022-04-07 DIAGNOSIS — N8500 Endometrial hyperplasia, unspecified: Principal | ICD-10-CM

## 2022-04-07 DIAGNOSIS — Z79899 Other long term (current) drug therapy: Secondary | ICD-10-CM | POA: Insufficient documentation

## 2022-04-07 DIAGNOSIS — Z87891 Personal history of nicotine dependence: Secondary | ICD-10-CM | POA: Diagnosis not present

## 2022-04-07 DIAGNOSIS — N938 Other specified abnormal uterine and vaginal bleeding: Secondary | ICD-10-CM | POA: Diagnosis not present

## 2022-04-07 DIAGNOSIS — F418 Other specified anxiety disorders: Secondary | ICD-10-CM | POA: Diagnosis not present

## 2022-04-07 DIAGNOSIS — Z8616 Personal history of COVID-19: Secondary | ICD-10-CM | POA: Diagnosis not present

## 2022-04-07 DIAGNOSIS — N838 Other noninflammatory disorders of ovary, fallopian tube and broad ligament: Secondary | ICD-10-CM | POA: Diagnosis not present

## 2022-04-07 DIAGNOSIS — Z90710 Acquired absence of both cervix and uterus: Secondary | ICD-10-CM | POA: Diagnosis present

## 2022-04-07 DIAGNOSIS — Z01818 Encounter for other preprocedural examination: Secondary | ICD-10-CM

## 2022-04-07 DIAGNOSIS — N946 Dysmenorrhea, unspecified: Secondary | ICD-10-CM | POA: Diagnosis not present

## 2022-04-07 HISTORY — DX: Vitamin D deficiency, unspecified: E55.9

## 2022-04-07 HISTORY — DX: Presence of spectacles and contact lenses: Z97.3

## 2022-04-07 HISTORY — PX: TOTAL LAPAROSCOPIC HYSTERECTOMY WITH SALPINGECTOMY: SHX6742

## 2022-04-07 HISTORY — DX: Gastro-esophageal reflux disease without esophagitis: K21.9

## 2022-04-07 HISTORY — PX: CYSTOSCOPY: SHX5120

## 2022-04-07 LAB — POCT PREGNANCY, URINE: Preg Test, Ur: NEGATIVE

## 2022-04-07 LAB — TYPE AND SCREEN
ABO/RH(D): O NEG
Antibody Screen: NEGATIVE

## 2022-04-07 LAB — ABO/RH: ABO/RH(D): O NEG

## 2022-04-07 SURGERY — HYSTERECTOMY, TOTAL, LAPAROSCOPIC, WITH SALPINGECTOMY
Anesthesia: General | Site: Ureter

## 2022-04-07 MED ORDER — ACETAMINOPHEN 10 MG/ML IV SOLN
INTRAVENOUS | Status: AC
  Filled 2022-04-07: qty 100

## 2022-04-07 MED ORDER — SODIUM CHLORIDE 0.9 % IV SOLN
INTRAVENOUS | Status: AC
  Filled 2022-04-07: qty 100

## 2022-04-07 MED ORDER — HYDROMORPHONE HCL 1 MG/ML IJ SOLN
INTRAMUSCULAR | Status: AC
  Filled 2022-04-07: qty 1

## 2022-04-07 MED ORDER — ONDANSETRON HCL 4 MG/2ML IJ SOLN
INTRAMUSCULAR | Status: DC | PRN
  Administered 2022-04-07: 4 mg via INTRAVENOUS

## 2022-04-07 MED ORDER — OXYCODONE HCL 5 MG PO TABS
ORAL_TABLET | ORAL | Status: AC
  Filled 2022-04-07: qty 2

## 2022-04-07 MED ORDER — OXYCODONE HCL 5 MG PO TABS
10.0000 mg | ORAL_TABLET | ORAL | Status: DC | PRN
  Administered 2022-04-07 – 2022-04-08 (×4): 10 mg via ORAL

## 2022-04-07 MED ORDER — SENNOSIDES-DOCUSATE SODIUM 8.6-50 MG PO TABS
1.0000 | ORAL_TABLET | Freq: Every evening | ORAL | 0 refills | Status: AC | PRN
  Filled 2022-04-07: qty 30, 30d supply, fill #0

## 2022-04-07 MED ORDER — KETOROLAC TROMETHAMINE 15 MG/ML IJ SOLN
INTRAMUSCULAR | Status: AC
  Filled 2022-04-07: qty 1

## 2022-04-07 MED ORDER — POVIDONE-IODINE 10 % EX SWAB: 2.0000 | Freq: Once | CUTANEOUS | Status: DC

## 2022-04-07 MED ORDER — IBUPROFEN 800 MG PO TABS
800.0000 mg | ORAL_TABLET | Freq: Three times a day (TID) | ORAL | 0 refills | Status: DC
  Filled 2022-04-07: qty 60, 20d supply, fill #0

## 2022-04-07 MED ORDER — ONDANSETRON HCL 4 MG PO TABS: 4.0000 mg | ORAL_TABLET | Freq: Four times a day (QID) | ORAL | Status: DC | PRN

## 2022-04-07 MED ORDER — SENNA 8.6 MG PO TABS
ORAL_TABLET | ORAL | Status: AC
  Filled 2022-04-07: qty 1

## 2022-04-07 MED ORDER — DOCUSATE SODIUM 100 MG PO CAPS
ORAL_CAPSULE | ORAL | Status: AC
  Filled 2022-04-07: qty 1

## 2022-04-07 MED ORDER — SUCCINYLCHOLINE CHLORIDE 200 MG/10ML IV SOSY
PREFILLED_SYRINGE | INTRAVENOUS | Status: AC
  Filled 2022-04-07: qty 10

## 2022-04-07 MED ORDER — KETOROLAC TROMETHAMINE 30 MG/ML IJ SOLN
INTRAMUSCULAR | Status: DC | PRN
  Administered 2022-04-07: 30 mg via INTRAVENOUS

## 2022-04-07 MED ORDER — ONDANSETRON 4 MG PO TBDP
4.0000 mg | ORAL_TABLET | Freq: Four times a day (QID) | ORAL | 0 refills | Status: DC | PRN
  Filled 2022-04-07: qty 20, 13d supply, fill #0

## 2022-04-07 MED ORDER — FENTANYL CITRATE (PF) 100 MCG/2ML IJ SOLN
INTRAMUSCULAR | Status: DC | PRN
  Administered 2022-04-07: 25 ug via INTRAVENOUS
  Administered 2022-04-07 (×2): 50 ug via INTRAVENOUS
  Administered 2022-04-07 (×3): 25 ug via INTRAVENOUS

## 2022-04-07 MED ORDER — ROCURONIUM BROMIDE 10 MG/ML (PF) SYRINGE
PREFILLED_SYRINGE | INTRAVENOUS | Status: AC
  Filled 2022-04-07: qty 10

## 2022-04-07 MED ORDER — FENTANYL CITRATE (PF) 100 MCG/2ML IJ SOLN: 25.0000 ug | INTRAMUSCULAR | Status: DC | PRN

## 2022-04-07 MED ORDER — FENTANYL CITRATE (PF) 100 MCG/2ML IJ SOLN
INTRAMUSCULAR | Status: AC
  Filled 2022-04-07: qty 2

## 2022-04-07 MED ORDER — SUGAMMADEX SODIUM 200 MG/2ML IV SOLN
INTRAVENOUS | Status: DC | PRN
  Administered 2022-04-07: 300 mg via INTRAVENOUS

## 2022-04-07 MED ORDER — SODIUM CHLORIDE 0.9 % IV SOLN
2.0000 g | INTRAVENOUS | Status: AC
  Administered 2022-04-07 (×2): 2 g via INTRAVENOUS

## 2022-04-07 MED ORDER — OXYCODONE HCL 5 MG PO TABS
5.0000 mg | ORAL_TABLET | Freq: Once | ORAL | Status: AC | PRN
  Administered 2022-04-07: 5 mg via ORAL

## 2022-04-07 MED ORDER — SODIUM CHLORIDE 0.9 % IR SOLN
Status: DC | PRN
  Administered 2022-04-07 (×2): 1000 mL

## 2022-04-07 MED ORDER — KETOROLAC TROMETHAMINE 30 MG/ML IJ SOLN
INTRAMUSCULAR | Status: AC
  Filled 2022-04-07: qty 1

## 2022-04-07 MED ORDER — OXYCODONE HCL 5 MG/5ML PO SOLN: 5.0000 mg | Freq: Once | ORAL | Status: AC | PRN

## 2022-04-07 MED ORDER — EPHEDRINE 5 MG/ML INJ
INTRAVENOUS | Status: AC
  Filled 2022-04-07: qty 5

## 2022-04-07 MED ORDER — SCOPOLAMINE 1 MG/3DAYS TD PT72
1.0000 | MEDICATED_PATCH | Freq: Once | TRANSDERMAL | Status: DC
  Administered 2022-04-07: 1.5 mg via TRANSDERMAL

## 2022-04-07 MED ORDER — OXYCODONE HCL 5 MG PO TABS
5.0000 mg | ORAL_TABLET | ORAL | 0 refills | Status: DC | PRN
  Filled 2022-04-07: qty 12, 2d supply, fill #0

## 2022-04-07 MED ORDER — ACETAMINOPHEN 500 MG PO TABS
1000.0000 mg | ORAL_TABLET | ORAL | Status: AC
  Administered 2022-04-07: 1000 mg via ORAL

## 2022-04-07 MED ORDER — DEXAMETHASONE SODIUM PHOSPHATE 10 MG/ML IJ SOLN
INTRAMUSCULAR | Status: DC | PRN
  Administered 2022-04-07 (×2): 5 mg via INTRAVENOUS

## 2022-04-07 MED ORDER — PHENYLEPHRINE 80 MCG/ML (10ML) SYRINGE FOR IV PUSH (FOR BLOOD PRESSURE SUPPORT)
PREFILLED_SYRINGE | INTRAVENOUS | Status: AC
  Filled 2022-04-07: qty 10

## 2022-04-07 MED ORDER — SIMETHICONE 80 MG PO CHEW: 80.0000 mg | CHEWABLE_TABLET | Freq: Four times a day (QID) | ORAL | Status: DC | PRN

## 2022-04-07 MED ORDER — HYDROMORPHONE HCL 1 MG/ML IJ SOLN
0.2500 mg | INTRAMUSCULAR | Status: DC | PRN
  Administered 2022-04-07 (×4): 0.5 mg via INTRAVENOUS

## 2022-04-07 MED ORDER — ROCURONIUM BROMIDE 10 MG/ML (PF) SYRINGE
PREFILLED_SYRINGE | INTRAVENOUS | Status: DC | PRN
  Administered 2022-04-07 (×3): 10 mg via INTRAVENOUS
  Administered 2022-04-07: 70 mg via INTRAVENOUS

## 2022-04-07 MED ORDER — ACETAMINOPHEN 500 MG PO TABS
1000.0000 mg | ORAL_TABLET | Freq: Three times a day (TID) | ORAL | 0 refills | Status: AC
  Filled 2022-04-07 – 2022-04-21 (×2): qty 60, 10d supply, fill #0

## 2022-04-07 MED ORDER — ALUM & MAG HYDROXIDE-SIMETH 200-200-20 MG/5ML PO SUSP: 30.0000 mL | ORAL | Status: DC | PRN

## 2022-04-07 MED ORDER — 0.9 % SODIUM CHLORIDE (POUR BTL) OPTIME
TOPICAL | Status: DC | PRN
  Administered 2022-04-07: 1000 mL

## 2022-04-07 MED ORDER — LIDOCAINE HCL (PF) 2 % IJ SOLN
INTRAMUSCULAR | Status: AC
  Filled 2022-04-07: qty 5

## 2022-04-07 MED ORDER — LACTATED RINGERS IV SOLN: INTRAVENOUS | Status: DC

## 2022-04-07 MED ORDER — PROPOFOL 500 MG/50ML IV EMUL
INTRAVENOUS | Status: AC
  Filled 2022-04-07: qty 50

## 2022-04-07 MED ORDER — GABAPENTIN 300 MG PO CAPS
ORAL_CAPSULE | ORAL | Status: AC
  Filled 2022-04-07: qty 1

## 2022-04-07 MED ORDER — MIDAZOLAM HCL 2 MG/2ML IJ SOLN
INTRAMUSCULAR | Status: DC | PRN
  Administered 2022-04-07: 2 mg via INTRAVENOUS

## 2022-04-07 MED ORDER — GABAPENTIN 300 MG PO CAPS
300.0000 mg | ORAL_CAPSULE | ORAL | Status: AC
  Administered 2022-04-07: 300 mg via ORAL

## 2022-04-07 MED ORDER — DOCUSATE SODIUM 100 MG PO CAPS
100.0000 mg | ORAL_CAPSULE | Freq: Two times a day (BID) | ORAL | Status: DC
  Administered 2022-04-07 (×2): 100 mg via ORAL

## 2022-04-07 MED ORDER — LIDOCAINE 2% (20 MG/ML) 5 ML SYRINGE
INTRAMUSCULAR | Status: DC | PRN
  Administered 2022-04-07: 100 mg via INTRAVENOUS

## 2022-04-07 MED ORDER — ONDANSETRON HCL 4 MG/2ML IJ SOLN: 4.0000 mg | Freq: Four times a day (QID) | INTRAMUSCULAR | Status: DC | PRN

## 2022-04-07 MED ORDER — OXYCODONE HCL 5 MG PO TABS
ORAL_TABLET | ORAL | Status: AC
  Filled 2022-04-07: qty 1

## 2022-04-07 MED ORDER — PROPOFOL 10 MG/ML IV BOLUS
INTRAVENOUS | Status: DC | PRN
  Administered 2022-04-07: 200 mg via INTRAVENOUS
  Administered 2022-04-07: 100 ug/kg/min via INTRAVENOUS

## 2022-04-07 MED ORDER — ONDANSETRON HCL 4 MG/2ML IJ SOLN
INTRAMUSCULAR | Status: AC
  Filled 2022-04-07: qty 2

## 2022-04-07 MED ORDER — ACETAMINOPHEN 500 MG PO TABS
ORAL_TABLET | ORAL | Status: AC
  Filled 2022-04-07: qty 2

## 2022-04-07 MED ORDER — MIDAZOLAM HCL 2 MG/2ML IJ SOLN
INTRAMUSCULAR | Status: AC
  Filled 2022-04-07: qty 2

## 2022-04-07 MED ORDER — KETOROLAC TROMETHAMINE 15 MG/ML IJ SOLN
15.0000 mg | Freq: Four times a day (QID) | INTRAMUSCULAR | Status: DC
  Administered 2022-04-07 – 2022-04-08 (×3): 15 mg via INTRAVENOUS

## 2022-04-07 MED ORDER — SENNA 8.6 MG PO TABS
1.0000 | ORAL_TABLET | Freq: Two times a day (BID) | ORAL | Status: DC
  Administered 2022-04-07 (×2): 8.6 mg via ORAL

## 2022-04-07 MED ORDER — OXYCODONE HCL 5 MG PO TABS
5.0000 mg | ORAL_TABLET | ORAL | Status: DC | PRN
  Administered 2022-04-07: 5 mg via ORAL

## 2022-04-07 MED ORDER — CYCLOBENZAPRINE HCL 10 MG PO TABS
10.0000 mg | ORAL_TABLET | Freq: Three times a day (TID) | ORAL | Status: DC | PRN
  Administered 2022-04-07 – 2022-04-08 (×3): 10 mg via ORAL
  Filled 2022-04-07 (×4): qty 1

## 2022-04-07 MED ORDER — SCOPOLAMINE 1 MG/3DAYS TD PT72
MEDICATED_PATCH | TRANSDERMAL | Status: AC
  Filled 2022-04-07: qty 1

## 2022-04-07 MED ORDER — PROPOFOL 1000 MG/100ML IV EMUL
INTRAVENOUS | Status: AC
  Filled 2022-04-07: qty 200

## 2022-04-07 MED ORDER — CEFOXITIN SODIUM 2 G IV SOLR
INTRAVENOUS | Status: AC
  Filled 2022-04-07: qty 2

## 2022-04-07 MED ORDER — ACETAMINOPHEN 10 MG/ML IV SOLN
1000.0000 mg | Freq: Four times a day (QID) | INTRAVENOUS | Status: AC
  Administered 2022-04-07: 1000 mg via INTRAVENOUS

## 2022-04-07 MED ORDER — HYDROMORPHONE HCL 1 MG/ML IJ SOLN
0.5000 mg | INTRAMUSCULAR | Status: DC | PRN
  Administered 2022-04-07 – 2022-04-08 (×4): 0.5 mg via INTRAVENOUS

## 2022-04-07 MED ORDER — AMISULPRIDE (ANTIEMETIC) 5 MG/2ML IV SOLN: 10.0000 mg | Freq: Once | INTRAVENOUS | Status: DC | PRN

## 2022-04-07 MED ORDER — DEXAMETHASONE SODIUM PHOSPHATE 10 MG/ML IJ SOLN
INTRAMUSCULAR | Status: AC
  Filled 2022-04-07: qty 1

## 2022-04-07 MED ORDER — ACETAMINOPHEN 500 MG PO TABS
1000.0000 mg | ORAL_TABLET | Freq: Four times a day (QID) | ORAL | Status: DC
  Administered 2022-04-07 – 2022-04-08 (×3): 1000 mg via ORAL

## 2022-04-07 SURGICAL SUPPLY — 62 items
ADH SKN CLS APL DERMABOND .7 (GAUZE/BANDAGES/DRESSINGS) ×2
APL SRG 38 LTWT LNG FL B (MISCELLANEOUS)
APPLICATOR ARISTA FLEXITIP XL (MISCELLANEOUS) IMPLANT
CABLE HIGH FREQUENCY MONO STRZ (ELECTRODE) IMPLANT
COVER MAYO STAND STRL (DRAPES) ×2 IMPLANT
COVER SURGICAL LIGHT HANDLE (MISCELLANEOUS) IMPLANT
DEFOGGER SCOPE WARMER CLEARIFY (MISCELLANEOUS) ×2 IMPLANT
DERMABOND ADVANCED .7 DNX12 (GAUZE/BANDAGES/DRESSINGS) ×2 IMPLANT
DRAPE SURG IRRIG POUCH 19X23 (DRAPES) ×2 IMPLANT
DRSG COVADERM PLUS 2X2 (GAUZE/BANDAGES/DRESSINGS) IMPLANT
DURAPREP 26ML APPLICATOR (WOUND CARE) ×2 IMPLANT
GLOVE BIO SURGEON STRL SZ 6.5 (GLOVE) IMPLANT
GLOVE BIO SURGEON STRL SZ7 (GLOVE) ×4 IMPLANT
GLOVE BIOGEL PI IND STRL 7.0 (GLOVE) ×4 IMPLANT
GLOVE SURG SS PI 7.0 STRL IVOR (GLOVE) IMPLANT
GOWN STRL REUS W/ TWL LRG LVL3 (GOWN DISPOSABLE) ×8 IMPLANT
GOWN STRL REUS W/TWL LRG LVL3 (GOWN DISPOSABLE) ×16 IMPLANT
HEMOSTAT ARISTA ABSORB 3G PWDR (HEMOSTASIS) IMPLANT
HIBICLENS CHG 4% 4OZ BTL (MISCELLANEOUS) ×4 IMPLANT
HOLDER FOLEY CATH W/STRAP (MISCELLANEOUS) IMPLANT
IRRIG SUCT STRYKERFLOW 2 WTIP (MISCELLANEOUS) ×2
IRRIGATION SUCT STRKRFLW 2 WTP (MISCELLANEOUS) ×2 IMPLANT
IV NS 1000ML (IV SOLUTION) ×4
IV NS 1000ML BAXH (IV SOLUTION) IMPLANT
KIT PINK PAD W/HEAD ARE REST (MISCELLANEOUS) ×2
KIT PINK PAD W/HEAD ARM REST (MISCELLANEOUS) ×2 IMPLANT
KIT TURNOVER CYSTO (KITS) ×2 IMPLANT
L-HOOK LAP DISP 36CM (ELECTROSURGICAL) ×4
LHOOK LAP DISP 36CM (ELECTROSURGICAL) IMPLANT
MANIPULATOR VCARE STD CRV RETR (MISCELLANEOUS) IMPLANT
NDL INSUFFLATION 14GA 120MM (NEEDLE) ×2 IMPLANT
NEEDLE INSUFFLATION 14GA 120MM (NEEDLE) ×2 IMPLANT
NS IRRIG 1000ML POUR BTL (IV SOLUTION) ×2 IMPLANT
NS IRRIG 500ML POUR BTL (IV SOLUTION) IMPLANT
OCCLUDER COLPOPNEUMO (BALLOONS) ×2 IMPLANT
PACK LAPAROSCOPY BASIN (CUSTOM PROCEDURE TRAY) ×2 IMPLANT
PENCIL SMOKE EVACUATOR (MISCELLANEOUS) ×2 IMPLANT
POUCH LAPAROSCOPIC INSTRUMENT (MISCELLANEOUS) ×2 IMPLANT
PROTECTOR NERVE ULNAR (MISCELLANEOUS) ×4 IMPLANT
SCISSORS LAP 5X35 DISP (ENDOMECHANICALS) IMPLANT
SEALER TISSUE G2 CVD JAW 35 (ENDOMECHANICALS) IMPLANT
SEALER TISSUE G2 CVD JAW 45CM (ENDOMECHANICALS) ×2
SET CYSTO W/LG BORE CLAMP LF (SET/KITS/TRAYS/PACK) ×2 IMPLANT
SET TUBE FILTERED XL AIRSEAL (SET/KITS/TRAYS/PACK) IMPLANT
SET TUBE SMOKE EVAC HIGH FLOW (TUBING) ×2 IMPLANT
SUT VIC AB 0 CT1 27 (SUTURE) ×2
SUT VIC AB 0 CT1 27XBRD ANBCTR (SUTURE) IMPLANT
SUT VIC AB 4-0 PS2 18 (SUTURE) IMPLANT
SUT VICRYL 0 UR6 27IN ABS (SUTURE) IMPLANT
SUT VICRYL 4-0 PS2 18IN ABS (SUTURE) ×4 IMPLANT
SUT VLOC 180 0 9IN  GS21 (SUTURE) ×2
SUT VLOC 180 0 9IN GS21 (SUTURE) ×2 IMPLANT
SYR 10ML LL (SYRINGE) ×2 IMPLANT
SYR 50ML LL SCALE MARK (SYRINGE) ×2 IMPLANT
TOWEL OR 17X24 6PK STRL BLUE (TOWEL DISPOSABLE) IMPLANT
TRAY FOLEY W/BAG SLVR 14FR (SET/KITS/TRAYS/PACK) ×2 IMPLANT
TROCAR ADV FIXATION 11X100MM (TROCAR) IMPLANT
TROCAR ADV FIXATION 5X100MM (TROCAR) ×2 IMPLANT
TROCAR XCEL NON BLADE 8MM B8LT (ENDOMECHANICALS) ×2 IMPLANT
TROCAR XCEL NON-BLD 5MMX100MML (ENDOMECHANICALS) ×2 IMPLANT
TROCAR Z-THREAD FIOS 5X100MM (TROCAR) IMPLANT
UNDERPAD 30X36 HEAVY ABSORB (UNDERPADS AND DIAPERS) ×2 IMPLANT

## 2022-04-07 NOTE — H&P (Signed)
Kimberly Mckay is an 42 y.o. female. With endometrial hyperplasia and AUB presenting for scheduled TLH/BS/cysto. Please see below for details of her history & surgical decision making.     RETURN GYNECOLOGY VISIT   Subjective:  Kimberly Mckay is a 42 y.o. with known endometrial hyperplasia without atypia presenting for pre operative evaluation.    Was first evaluated by Dr. Elly Modena 10/15/21 for long, heavy and painful periods. EMB on 12/28/21 c/w simple hyperplasia without atypia, cervical biopsy c/w endocervical polyp. Tried megace & provera without improvement in her bleeding. Tried IUD but was ultimately removed 01/25/22 due to severe pain and worsening of bleeding. Continues to have daily bleeding.    I personally reviewed the notes from Dr. Elly Modena on 10/15/21, Dr. Gala Romney on 01/18/22, and Dr. Nelda Marseille from 01/25/22. I reviewed her labs from 10/15/21 (Hgb 12.7, CMP WNL, A1c 5.5, TSH 1.91) and her biopsy/surgical pathology from 12/28/21 as noted above. I reviewed her ultrasound from 01/25/22 - 11.4 x 5.8 x 7.4cm ante ut, nml ovaries.   PMH: BMI 45, asthma, anemia, dep/anx PSH: breast reduction, denies any history of abdominal/pelvic surgery Meds: megace 80 BID, prevacid, albuterol, ibuprofen prn All: ASA (hives), stadol, sulfas, meperidine OB: SVDx2 Pap Hx:  01/18/22 NILM/HPV negative Soc: Denies t/e/d     Past Medical History:  Diagnosis Date   Anemia 2024   due to heavy periods   Anxiety    takes xanax prn, follows w/ PCP   Asthma    Hasn't had an asthma exacerbation in many years per pt on 03/24/22.   Back pain    Complication of anesthesia    pt aspirated following upper GI   COVID-19 04/2019   Depression    Eczema    Fibromyalgia    GERD (gastroesophageal reflux disease)    takes Prevacid   PONV (postoperative nausea and vomiting)    Swelling of both lower extremities    Vitamin D deficiency    taking Vitamin D supplements   Wears contact lenses    Wears glasses      Past Surgical History:  Procedure Laterality Date   BREAST CYST EXCISION Bilateral 01/29/2021   Procedure: Excision of accessory axillary tissue;  Surgeon: Wallace Going, DO;  Location: [facility];  Service: Plastics;  Laterality: Bilateral;  1.5 hours   BREAST REDUCTION SURGERY Bilateral 09/03/2020   Procedure: BILATERAL BREAST REDUCTION WITH LIPOSUCTION;  Surgeon: Wallace Going, DO;  Location: [facility];  Service: Plastics;  Laterality: Bilateral;  4 hours total   UPPER GI ENDOSCOPY     pt states she aspirated with this procedure   WISDOM TOOTH EXTRACTION      Family History  Problem Relation Age of Onset   Diabetes Mother    Heart disease Mother    Cancer Mother    Thyroid disease Mother    Obesity Mother    Hypertension Father    Hyperlipidemia Father    Alcoholism Father     Social History:  reports that she quit smoking about 5 years ago. Her smoking use included cigarettes. She has a 10.00 pack-year smoking history. She has never used smokeless tobacco. She reports that she does not currently use alcohol. She reports that she does not use drugs.  Allergies:  Allergies  Allergen Reactions   Aspirin Hives   Butorphanol Other (See Comments)    Other reaction(s): Psychosis (intolerance) Hallucinations    Propoxyphene Hives   Sulfa Antibiotics Nausea And Vomiting  Meperidine Rash    Hives      Medications Prior to Admission  Medication Sig Dispense Refill Last Dose   ALPRAZolam (XANAX) 0.5 MG tablet Take 0.5 mg by mouth as needed for anxiety.   04/07/2022 at 0515   ferrous sulfate 325 (65 FE) MG EC tablet Take 1 tablet (325 mg total) by mouth daily with breakfast. 90 tablet 1 Past Week   ibuprofen (ADVIL) 800 MG tablet Take 1 tablet (800 mg total) by mouth every 8 (eight) hours as needed. 30 tablet 0 Past Week   lansoprazole (PREVACID) 15 MG capsule Take 15 mg by mouth daily.   04/06/2022   megestrol (MEGACE) 40 MG  tablet Take 2 tablets (80 mg total) by mouth daily. 180 tablet 0 04/06/2022   tobramycin (TOBREX) 0.3 % ophthalmic solution Place 1 drop into the right eye every 4 (four) hours. 5 mL 0 Past Week   Vitamin D, Ergocalciferol, (DRISDOL) 1.25 MG (50000 UNIT) CAPS capsule Take 1 capsule (50,000 Units total) by mouth every 7 (seven) days. 12 capsule 1 Past Week   albuterol (VENTOLIN HFA) 108 (90 Base) MCG/ACT inhaler Inhale into the lungs.   More than a month   WEGOVY 0.5 MG/0.5ML SOAJ Inject 0.5 mg into the skin once a week. Use this dose for 1 month (4 shots) and then increase to next higher dose. 2 mL 0 03/23/2022   WEGOVY 1 MG/0.5ML SOAJ Inject 1 mg into the skin once a week. Use this dose for 1 month (4 shots) and then increase to next higher dose. (Patient taking differently: Inject 1 mg into the skin once a week. Use this dose for 1 month (4 shots) and then increase to next higher dose. Haven't started this dose as of 03/24/22.) 2 mL 0     Review of Systems  Blood pressure (!) 147/89, pulse 93, temperature 97.9 F (36.6 C), temperature source Oral, resp. rate 17, height 5' 7"$  (1.702 m), weight 123.6 kg, SpO2 99 %. Physical Exam  Results for orders placed or performed during the hospital encounter of 04/07/22 (from the past 24 hour(s))  Pregnancy, urine POC     Status: None   Collection Time: 04/07/22  6:49 AM  Result Value Ref Range   Preg Test, Ur NEGATIVE NEGATIVE    No results found.  Assessment/Plan: [name] is a 42 y.o. with G2P2 with AUB secondary to simple endometrial hyperplasia without atypia   1. Simple endometrial hyperplasia We reviewed management options for simple EH without atypia including progestins, IUD and hysterectomy. She has not been able to tolerate progestins/IUD and has not had relief in her symptoms, so she would like to move forward with definitive management with hysterectomy.             - Discussed route of hysterectomy. Reviewed typically shorter  operative time and recovery with vaginal hysterectomy versus laparoscopic. Focused conversation on opportunistic salpingectomy. Reviewed overall low risk of ovarian cancer, but evidence of risk reduction with salpingectomy. Discussed that I can easily and safely remove fallopian tubes laparoscopically, but that we may not be able to remove tubes vaginally if unable to adequately visualize and clamp for safe removal. After discussion, she desires opportunistic salpingectomy and would prefer a laparoscopic approach.              - We discussed ovarian conservation. Given her age, I recommended leaving ovaries in place. She is in agreement with this recommendation             -  We discussed total vs supracervical hysterectomy. Given EH, I recommended removal of cervix to lower risk of persistent hyperplasia and bleeding. She is in agreement with this recommendation.   - Diagnosis: Endometrial hyperplasia - Planned surgery: TLH/BS/cysto - Risks of surgery include but are not limited to:  Bleeding - Can bleed enough to need transfusion or need for additional surgeries I.e. conversation to open surgery.  Infection Injury to surrounding organs/tissues (i.e. bowel/bladder/ureters, major blood vessels) Need for additional procedures - Would be specific to a complication or injury Wound complications Hospital re-admission Conversion to open surgery - reviewed some complications may necessitate or it may be the only way to complete the surgery.   VTE  Nerve injury or burns related to surgical equipment Medical complications of surgery/anesthesia including heart attack and stroke  To OR. Cefoxitin for pre op abx. Anticipate dc from pacu   Inez Catalina 04/07/2022, 8:30 AM

## 2022-04-07 NOTE — Op Note (Signed)
Kimberly Mckay PROCEDURE DATE: 04/07/2022  PREOPERATIVE DIAGNOSIS: Endometrial hyperplasia - simple without atypia  POSTOPERATIVE DIAGNOSIS: The same PROCEDURE: Total laparoscopic hysterectomy, bilateral salpingectomy, cystoscopy SURGEON:  Dr. Gale Journey ASSISTANT:  Dr. Okey Dupre.  An experienced assistant was required given the standard of surgical care given the complexity of the case.  This assistant was needed for exposure, dissection, suctioning, retraction, instrument exchange, and for overall help during the procedure.  INDICATIONS: 42 y.o. G2P2  here for definitive surgical management secondary to the indications listed under preoperative diagnoses; please see preoperative note for further details.  Risks of surgery were discussed with the patient including but not limited to: bleeding which may require transfusion or reoperation; infection which may require antibiotics; injury to bowel, bladder, ureters or other surrounding organs; need for additional procedures; thromboembolic phenomenon, incisional problems and other postoperative/anesthesia complications. Written informed consent was obtained.    FINDINGS:    ANESTHESIA:    General INTRAVENOUS FLUIDS:1200  ml ESTIMATED BLOOD LOSS:100 ml URINE OUTPUT: 200 ml   SPECIMENS: Uterus, cervix, left and right fallopian tubes COMPLICATIONS: None immediate  PROCEDURE IN DETAIL:  The patient received intravenous antibiotics and had sequential compression devices applied to her lower extremities while in the preoperative area.  She was then taken to the operating room where general anesthesia was administered and was found to be adequate.  She was placed in the dorsal lithotomy position, and was prepped and draped in a sterile manner.    After an adequate timeout was performed, a Vcare uterine manipulator was placed at this time.  A Foley catheter was inserted into her bladder and attached to constant drainage. Attention was turned  to the abdomen where an umbilical incision was made with the scalpel. The fascia was identified, elevated and incised. An S retractor was placed into the abdominal cavity and intraperitoneal entry was confirmed. An 58m Hasson trocar was introduced and appropriate entry was confirmed with the laparoscope. The abdomen was insufflated. Inspection below the point of entry revealed no evidence of bowel injury. Patient placed in steep trendelenburg. Two lateral 5 mm ports and one suprapubic 51mport were placed.   The pelvis was then carefully examined.  Attention was turned to the fallopian tubes; these were freed from the underlying mesosalpinx and the uterine attachments using the Enseal device.  The left round ligament was clamped and transected with the Enseal device. The anterior left of the broad was transected and bladder flap was started. The ureter was identified transperitoneally. A window was made in the posterior leaf of the broad to drop ureter away. The uteroovarian ligament was then clamped, sealed and transected with the Enseal and the ovary was dropped away. The posterior peritoneum was developed. Noted to have friable tissue with venous bleeding, so uterines were skeletonized and sealed.   Attention was turned to the right side and the procedure was repeated in the same fashion. Prior to skeletonizing the uterines, the bladder flap was further developed and dropped away from the colpotomy cup. The uterines were then skeletonized, sealed and cut with the Enseal. Attention was turned to the left side where the uterines were again sealed and cut.   Attention was then turned to the cervicovaginal junction, and the bovie hook was used to transect the cervix from the surrounding vagina using the ring of the Vcare as a guide. This was done circumferentially allowing total hysterectomy.  The uterus was then removed from the vagina and the vaginal cuff incision was then closed  with 2-0 V-loc in a single  running layer. Cuff examined vaginally and noted to be well approximated. The abdomen was irrigated and a small bleeding area on the L ovary was cauterized. A ray tec was used to hold pressure over the cuff. The abdominal pressure was reduced. Raytec was removed from the abdominal cavity and final hemostasis was confirmed.     Attention was turned to cytoscopy. A 70 degree scope was inserted into the bladder and the bladder was partially filled. Bilateral ureteral jets noted and bladder intact without any stitch/foreign body/abnormal appearance. The cystoscope was removed.  All trocars were removed under direct visualization, and the abdomen was desufflated. All skin incisions were closed with 4-0 Vicryl subcuticular stitches and Dermabond. The patient tolerated the procedures well.    All instruments, needles, and sponge counts were correct x 2. The patient was taken to the recovery room in stable condition.   Gale Journey, MD [place], [facility] for [employer], [place]

## 2022-04-07 NOTE — Transfer of Care (Signed)
Immediate Anesthesia Transfer of Care Note  Patient: Betti Dorin  Procedure(s) Performed: Procedure(s) (LRB): TOTAL LAPAROSCOPIC HYSTERECTOMY WITH SALPINGECTOMY (Bilateral) CYSTOSCOPY (N/A)  Patient Location: PACU  Anesthesia Type: General  Level of Consciousness: awake, oriented, sedated and patient cooperative  Airway & Oxygen Therapy: Patient Spontanous Breathing and Patient connected to face mask oxygen  Post-op Assessment: Report given to PACU RN and Post -op Vital signs reviewed and stable  Post vital signs: Reviewed and stable  Complications: No apparent anesthesia complications Last Vitals:  Vitals Value Taken Time  BP    Temp    Pulse 81 04/07/22 1231  Resp 16 04/07/22 1231  SpO2 100 % 04/07/22 1231  Vitals shown include unvalidated device data.  Last Pain:  Vitals:   04/07/22 0706  TempSrc: Oral  PainSc: 0-No pain      Patients Stated Pain Goal: 6 ([id number])  Complications: No notable events documented.

## 2022-04-07 NOTE — Anesthesia Postprocedure Evaluation (Signed)
Anesthesia Post Note  Patient: Kimberly Mckay  Procedure(s) Performed: TOTAL LAPAROSCOPIC HYSTERECTOMY WITH SALPINGECTOMY (Bilateral: Abdomen) CYSTOSCOPY (Ureter)     Patient location during evaluation: PACU Anesthesia Type: General Level of consciousness: awake and alert Pain management: pain level controlled Vital Signs Assessment: post-procedure vital signs reviewed and stable Respiratory status: spontaneous breathing, nonlabored ventilation and respiratory function stable Cardiovascular status: blood pressure returned to baseline Postop Assessment: no apparent nausea or vomiting Anesthetic complications: no   No notable events documented.  Last Vitals:  Vitals:   04/07/22 1300 04/07/22 1315  BP: (!) 140/99 139/88  Pulse: 78 (!) 59  Resp: 20 11  Temp:    SpO2: 100% 99%            Marthenia Rolling

## 2022-04-07 NOTE — Anesthesia Procedure Notes (Signed)
Procedure Name: Intubation Date/Time: 04/07/2022 8:46 AM  Performed by: Suan Halter, CRNAPre-anesthesia Checklist: Patient identified, Emergency Drugs available, Suction available and Patient being monitored Patient Re-evaluated:Patient Re-evaluated prior to induction Oxygen Delivery Method: Circle system utilized Preoxygenation: Pre-oxygenation with 100% oxygen Induction Type: IV induction Ventilation: Mask ventilation without difficulty Laryngoscope Size: Mac and 4 Grade View: Grade I Tube type: Oral Tube size: 7.0 mm Number of attempts: 1 Airway Equipment and Method: Stylet and Oral airway Placement Confirmation: ETT inserted through vocal cords under direct vision, positive ETCO2 and breath sounds checked- equal and bilateral Secured at: 22 cm Tube secured with: Tape Dental Injury: Teeth and Oropharynx as per pre-operative assessment

## 2022-04-07 NOTE — Progress Notes (Signed)
42yo POD0 s/p TLH/BS/cysto  Evaluated patient around 2pm when she was moving to extended recovery - pt reported inadequately controlled suprapubic, vaginal and rectal pain. Was able to ambulate to the bed, but required significant assistance. Vital signs appropriate - HR 70s, Bps 140s/70-80s on room air. Abdomen soft, appropriately tender for POD0 with scant blood on pad.   Discussed continued obs in extended recovery. Just spoke w/ her RN at 6:30 for clinical update - pt was able to eat, ambulate without dizziness, and void. She continues to have vaginal pain, but got some relief from flexeril. Vital signs are appropriate and her abdomen is soft.  Will admit overnight for observation/extended recovery. Plan for CBC in [place], MD [place], [facility] for [facility], [place]

## 2022-04-08 ENCOUNTER — Encounter (HOSPITAL_BASED_OUTPATIENT_CLINIC_OR_DEPARTMENT_OTHER): Payer: Self-pay | Admitting: Obstetrics and Gynecology

## 2022-04-08 ENCOUNTER — Encounter: Payer: Self-pay | Admitting: Obstetrics and Gynecology

## 2022-04-08 ENCOUNTER — Other Ambulatory Visit (HOSPITAL_COMMUNITY): Payer: Self-pay

## 2022-04-08 DIAGNOSIS — Z90710 Acquired absence of both cervix and uterus: Secondary | ICD-10-CM | POA: Diagnosis not present

## 2022-04-08 DIAGNOSIS — Z87891 Personal history of nicotine dependence: Secondary | ICD-10-CM | POA: Diagnosis not present

## 2022-04-08 DIAGNOSIS — Z8616 Personal history of COVID-19: Secondary | ICD-10-CM | POA: Diagnosis not present

## 2022-04-08 DIAGNOSIS — J45909 Unspecified asthma, uncomplicated: Secondary | ICD-10-CM | POA: Diagnosis not present

## 2022-04-08 DIAGNOSIS — N8500 Endometrial hyperplasia, unspecified: Secondary | ICD-10-CM | POA: Diagnosis not present

## 2022-04-08 DIAGNOSIS — Z79899 Other long term (current) drug therapy: Secondary | ICD-10-CM | POA: Diagnosis not present

## 2022-04-08 LAB — CBC
HCT: 32.4 % — ABNORMAL LOW (ref 36.0–46.0)
Hemoglobin: 9.4 g/dL — ABNORMAL LOW (ref 12.0–15.0)
MCH: 22.5 pg — ABNORMAL LOW (ref 26.0–34.0)
MCHC: 29 g/dL — ABNORMAL LOW (ref 30.0–36.0)
MCV: 77.5 fL — ABNORMAL LOW (ref 80.0–100.0)
Platelets: 305 10*3/uL (ref 150–400)
RBC: 4.18 MIL/uL (ref 3.87–5.11)
RDW: 16.1 % — ABNORMAL HIGH (ref 11.5–15.5)
WBC: 11.4 10*3/uL — ABNORMAL HIGH (ref 4.0–10.5)
nRBC: 0 % (ref 0.0–0.2)

## 2022-04-08 LAB — SURGICAL PATHOLOGY

## 2022-04-08 MED ORDER — ACETAMINOPHEN 500 MG PO TABS
ORAL_TABLET | ORAL | Status: AC
  Filled 2022-04-08: qty 2

## 2022-04-08 MED ORDER — PHENAZOPYRIDINE HCL 200 MG PO TABS
200.0000 mg | ORAL_TABLET | Freq: Three times a day (TID) | ORAL | 0 refills | Status: DC | PRN
  Filled 2022-04-08: qty 15, 5d supply, fill #0

## 2022-04-08 MED ORDER — OXYCODONE HCL 5 MG PO TABS
ORAL_TABLET | ORAL | Status: AC
  Filled 2022-04-08: qty 2

## 2022-04-08 MED ORDER — KETOROLAC TROMETHAMINE 15 MG/ML IJ SOLN
INTRAMUSCULAR | Status: AC
  Filled 2022-04-08: qty 1

## 2022-04-08 MED ORDER — HYDROMORPHONE HCL 2 MG PO TABS: 2.0000 mg | ORAL_TABLET | ORAL | Status: DC | PRN

## 2022-04-08 MED ORDER — HYDROMORPHONE HCL 2 MG PO TABS
2.0000 mg | ORAL_TABLET | Freq: Once | ORAL | Status: AC
  Administered 2022-04-08: 2 mg via ORAL

## 2022-04-08 MED ORDER — HYDROMORPHONE HCL 2 MG PO TABS
ORAL_TABLET | ORAL | Status: AC
  Filled 2022-04-08: qty 1

## 2022-04-08 MED ORDER — HYDROMORPHONE HCL 2 MG PO TABS
2.0000 mg | ORAL_TABLET | ORAL | 0 refills | Status: AC | PRN
  Filled 2022-04-08: qty 15, 3d supply, fill #0

## 2022-04-08 MED ORDER — HYDROMORPHONE HCL 1 MG/ML IJ SOLN
INTRAMUSCULAR | Status: AC
  Filled 2022-04-08: qty 1

## 2022-04-08 NOTE — Progress Notes (Signed)
Pt c/o of left hand pins and needles, is able to move fingers and feel pressures when I touch her left hand.   Will continue to monitor.

## 2022-04-08 NOTE — Progress Notes (Signed)
1 Day Post-Op Procedure(s) (LRB): TOTAL LAPAROSCOPIC HYSTERECTOMY WITH SALPINGECTOMY (Bilateral) CYSTOSCOPY (N/A)  Subjective: Patient reports continued vaginal/rectal pain that is not at all alleviated with oxycodone regardless of dose. Gets relief with IV dilaudid. Also reports burning with urination and requests rx for pyridium. Was able to ambulate to restroom throughout the night, is voiding without issue, and tolerating crackers and liquids. Denies nausea/vomiting. Scant vaginal bleeding.  Objective: I have reviewed patient's vital signs, intake and output, medications, and labs.    04/08/2022    8:19 AM 04/08/2022    6:30 AM 04/08/2022    2:15 AM  Vitals with BMI  Systolic [id number]  Diastolic 73 62 72  Pulse 63 63 66   General: alert, cooperative, and no distress Resp: normal respiratory effort Cardio: normal rate GI: normal findings: soft, mildly tender in the bilateral lower quadrants without rebound and incisions clean, dry, and intact - 1 umbilical covered w/ primipore and 3 port sites closed w/ glue. Vaginal Bleeding: minimal blood on pad per pt & rn report (exam deferred)  Hgb 9.4 this AM  Assessment: POD1 s/p Procedure(s): TOTAL LAPAROSCOPIC HYSTERECTOMY WITH SALPINGECTOMY (Bilateral) CYSTOSCOPY (N/A): progressing appropriately  Plan: - VSS, UOP adequate and Hgb appropriate for EBL (10 > EBL 100cc > 9.4). Abdomen soft & appropriately tender for POD1 - Meeting appropriate post op milestones. Tolerating PO, ambulating & voiding spontaneously.  - Suspect vaginal/rectal pain is muscle spasm related to surgery and uterine manipulator use. No evidence of inappropriate post op bleeding/hematoma at this time. - Will trial PO dilaudid. If adequate pain relief, anticipate discharge home today   LOS: 0 days   Inez Catalina, MD 04/08/2022, 8:29 AM

## 2022-04-08 NOTE — Discharge Summary (Signed)
Gynecology Physician Postoperative Discharge Summary  Patient ID: Kimberly Mckay MRN: [id number] DOB/AGE: 11-21-1980 42 y.o.  Admit Date: 04/07/2022 Discharge Date: 04/08/2022  Preoperative Diagnoses: Simple endometrial hyperplasia without atypia  Procedures: Procedure(s) (LRB): TOTAL LAPAROSCOPIC HYSTERECTOMY WITH SALPINGECTOMY (Bilateral) CYSTOSCOPY (N/A)  Hospital Course:  Kimberly Mckay is a 42 y.o. G2P2  admitted for scheduled surgery.  She underwent an uncomplicated TLH/BS/cystoscopy. For further details about surgery, please refer to the operative report. Patient was admitted for observation overnight for difficult to control vaginal/rectal pain. Her pain was ultimately appropriately controlled with scheduled tylenol, toradol and PO dilaudid prn. Her post operative hemoglobin was appropriate for her EBL (10 > EBL 100cc > 9.4) and her abdomen was soft and appropriately tender for POD1. By time of discharge on POD#1, her pain was controlled on oral pain medications; she was ambulating, voiding without difficulty, and tolerating regular diet. She was deemed stable for discharge to home.   Significant Labs:    Latest Ref Rng & Units 04/08/2022    5:15 AM 04/05/2022    9:02 AM 02/26/2022    3:36 PM  CBC  WBC 4.0 - 10.5 K/uL 11.4  6.8  8.5   Hemoglobin 12.0 - 15.0 g/dL 9.4  10.0  12.5   Hematocrit 36.0 - 46.0 % 32.4  34.9  38.9   Platelets 150 - 400 K/uL 305  290  295     Discharge Exam: Blood pressure 118/73, pulse 63, temperature 98.2 F (36.8 C), resp. rate 12, height 5' 7"$  (1.702 m), weight 123.6 kg, SpO2 94 %. General appearance: alert and no distress  Resp: normal respiratory effort on room air Cardio: regular rate and rhythm  GI: soft, mildly tender in the lower quadrants with no rebound or guarding Incision: umbilical incision w/ primipore dressing in place, 3 lap sites c/d/i with surgical glue. No drainage noted. Pelvic: scant blood on pad per patient & RN  Extremities:  extremities normal, atraumatic, no cyanosis or edema and Homans sign is negative, no sign of DVT  Discharged Condition: Stable  Disposition: Discharge disposition: 01-Home or Self Care      Discharge Instructions     Activity as tolerated - No lifting > 10-15lbs  Complete by: As directed    Call MD for:  difficulty breathing, headache or visual disturbances   Complete by: As directed    Call MD for:  persistant nausea and vomiting   Complete by: As directed    Call MD for:  redness, tenderness, or signs of infection (pain, swelling, redness, odor or green/yellow discharge around incision site)   Complete by: As directed    Call MD for:  severe uncontrolled pain   Complete by: As directed    Call MD for:  temperature >100.4   Complete by: As directed    Diet general   Complete by: As directed    Discharge wound care:   Complete by: As directed    Leave the dressing over your belly button on for 24-48 hours.  The rest of your incisions are covered with surgical glue. You can get it wet after 24 hours. The glue will fall off on it's own.   Sexual Activity Restrictions   Complete by: As directed    Avoid sexual activity for 12 weeks      Allergies as of 04/08/2022       Reactions   Aspirin Hives   Butorphanol Other (See Comments)   Other reaction(s): Psychosis (intolerance) Hallucinations   Propoxyphene Hives  Sulfa Antibiotics Nausea And Vomiting   Meperidine Rash   Hives        Medication List     STOP taking these medications    megestrol 40 MG tablet Commonly known as: MEGACE       TAKE these medications    acetaminophen 500 MG tablet Commonly known as: TYLENOL Take 2 tablets (1,000 mg total) by mouth every 8 (eight) hours. Notes to patient: Next dose is due at 2 pm as needed for pain   albuterol 108 (90 Base) MCG/ACT inhaler Commonly known as: VENTOLIN HFA Inhale into the lungs.   ALPRAZolam 0.5 MG tablet Commonly known as: XANAX Take 0.5 mg  by mouth as needed for anxiety.   ferrous sulfate 325 (65 FE) MG EC tablet Take 1 tablet (325 mg total) by mouth daily with breakfast.   HYDROmorphone 2 MG tablet Commonly known as: DILAUDID Take 1 tablet (2 mg total) by mouth every 4 (four) hours as needed for up to 7 days for severe pain. Notes to patient: Next dose is due at 1215 pm as needed for pain   ibuprofen 800 MG tablet Commonly known as: ADVIL Take 1 tablet (800 mg total) by mouth every 8 (eight) hours. What changed:  when to take this reasons to take this Notes to patient: Next dose is due at 2 pm as needed for pain.    lansoprazole 15 MG capsule Commonly known as: PREVACID Take 15 mg by mouth daily.   ondansetron 4 MG disintegrating tablet Commonly known as: ZOFRAN-ODT Dissolve 1 tablet (4 mg total) by mouth every 6 (six) hours as needed for nausea.   phenazopyridine 200 MG tablet Commonly known as: Pyridium Take 1 tablet (200 mg total) by mouth 3 (three) times daily as needed for pain (urethral spasm).   senna-docusate 8.6-50 MG tablet Commonly known as: Senokot-S Take 1 tablet by mouth at bedtime as needed for mild constipation.   tobramycin 0.3 % ophthalmic solution Commonly known as: Tobrex Place 1 drop into the right eye every 4 (four) hours.   Vitamin D (Ergocalciferol) 1.25 MG (50000 UNIT) Caps capsule Commonly known as: DRISDOL Take 1 capsule (50,000 Units total) by mouth every 7 (seven) days.   Wegovy 0.5 MG/0.5ML Soaj Generic drug: Semaglutide-Weight Management Inject 0.5 mg into the skin once a week. Use this dose for 1 month (4 shots) and then increase to next higher dose. What changed: Another medication with the same name was changed. Make sure you understand how and when to take each.   Wegovy 1 MG/0.5ML Soaj Generic drug: Semaglutide-Weight Management Inject 1 mg into the skin once a week. Use this dose for 1 month (4 shots) and then increase to next higher dose. What changed: additional  instructions               Discharge Care Instructions  (From admission, onward)           Start     Ordered   04/07/22 0000  Discharge wound care:       Comments: Leave the dressing over your belly button on for 24-48 hours.  The rest of your incisions are covered with surgical glue. You can get it wet after 24 hours. The glue will fall off on it's own.   04/07/22 1212           Future Appointments  Date Time Provider [place]  04/20/2022  8:10 AM Donella Stade, PA-C PCK-PCK None  05/07/2022  1:30  PM Dillingham, Loel Lofty, DO PSS-PSS None  05/20/2022 12:50 PM GI-BCG MM 2 GI-BCGMM GI-BREAST CE    Follow-up [place] for [facility] Follow up in 4 week(s).   Specialty: Obstetrics and Gynecology Why: We will let you know your post operative appointment date & time. Please call or send a MyChart message if you have questions or concerns before your appointment. Contact information: [place], [place] [phone]                Total discharge time: 30 minutes   Signed: Gale Journey, MD [place], [facility], [place] for [facility], [place]

## 2022-04-17 ENCOUNTER — Encounter: Payer: Self-pay | Admitting: Obstetrics and Gynecology

## 2022-04-18 ENCOUNTER — Encounter: Payer: Self-pay | Admitting: Obstetrics and Gynecology

## 2022-04-20 ENCOUNTER — Ambulatory Visit (INDEPENDENT_AMBULATORY_CARE_PROVIDER_SITE_OTHER): Payer: BC Managed Care – PPO | Admitting: Physician Assistant

## 2022-04-20 ENCOUNTER — Encounter: Payer: Self-pay | Admitting: Physician Assistant

## 2022-04-20 VITALS — BP 119/75 | HR 82 | Ht 67.0 in | Wt 274.0 lb

## 2022-04-20 DIAGNOSIS — Z90710 Acquired absence of both cervix and uterus: Secondary | ICD-10-CM | POA: Diagnosis not present

## 2022-04-20 DIAGNOSIS — N949 Unspecified condition associated with female genital organs and menstrual cycle: Secondary | ICD-10-CM

## 2022-04-20 DIAGNOSIS — R300 Dysuria: Secondary | ICD-10-CM | POA: Diagnosis not present

## 2022-04-20 DIAGNOSIS — Z6841 Body Mass Index (BMI) 40.0 and over, adult: Secondary | ICD-10-CM

## 2022-04-20 DIAGNOSIS — N898 Other specified noninflammatory disorders of vagina: Secondary | ICD-10-CM | POA: Diagnosis not present

## 2022-04-20 LAB — POCT URINALYSIS DIP (CLINITEK)
Bilirubin, UA: NEGATIVE
Glucose, UA: NEGATIVE mg/dL
Ketones, POC UA: NEGATIVE mg/dL
Nitrite, UA: NEGATIVE
POC PROTEIN,UA: 30 — AB
Spec Grav, UA: 1.025 (ref 1.010–1.025)
Urobilinogen, UA: 0.2 E.U./dL
pH, UA: 5.5 (ref 5.0–8.0)

## 2022-04-20 MED ORDER — FLUCONAZOLE 150 MG PO TABS: 150.0000 mg | ORAL_TABLET | Freq: Once | ORAL | 0 refills | Status: DC

## 2022-04-20 MED ORDER — FLUCONAZOLE 150 MG PO TABS: 150.0000 mg | ORAL_TABLET | Freq: Once | ORAL | 0 refills | Status: AC

## 2022-04-20 MED ORDER — FERROUS SULFATE 325 (65 FE) MG PO TBEC: 325.0000 mg | DELAYED_RELEASE_TABLET | Freq: Every day | ORAL | 1 refills | Status: AC

## 2022-04-20 MED ORDER — VITAMIN D (ERGOCALCIFEROL) 1.25 MG (50000 UNIT) PO CAPS: 50000.0000 [IU] | ORAL_CAPSULE | ORAL | 1 refills | Status: AC

## 2022-04-20 NOTE — Progress Notes (Signed)
Established Patient Office Visit  Subjective   Patient ID: Afua Bost, female    DOB: Oct 04, 1980  Age: 42 y.o. MRN: [id number]  Chief Complaint  Patient presents with   Obesity   Follow-up    HPI Pt is a 42 yo obese female who presents to the clinic to follow up on wegovy for weight. She had Total hysterectomy on 04/07/2022.   She is doing well on wegovy .'5mg'$  weekly. She has lost 10lbs. She feels less hungry and more satisfied. She did have some nausea after last injection. She has '1mg'$  at home.   She continues to have a lot of lower abdominal pain and soreness from hysterectomy on 2/14. She has noticed over the last few days burning with urination and feeling "like she has a deep vaginal itch". She has had some odor. Denies any noticeable discharge. She has used summers eve products to clean with. She has only taken a shower. She has had no follow up with GYN. No fever, chills, body aches. She is only taking tylenol and ibuprofen which helps. Pt has hx of yeast infections. She contacted GYN and told her to get urine culture and wet prep done here.   .. Active Ambulatory Problems    Diagnosis Date Noted   Other fatigue 03/20/2020   SOBOE (shortness of breath on exertion) 03/20/2020   Mood disorder ([place])- emotional eating 03/20/2020   Anemia 03/20/2020   Other constipation 03/20/2020   At risk for impaired metabolic function [id number]   Vitamin D deficiency 04/02/2020   At risk for diabetes mellitus 04/02/2020   Absolute anemia 04/02/2020   Insulin resistance 04/15/2020   Class 3 severe obesity due to excess calories without serious comorbidity with body mass index (BMI) of 40.0 to 44.9 in adult [facility]) 04/15/2020   Back pain 04/18/2020   Neck pain 04/18/2020   Symptomatic mammary hypertrophy 04/18/2020   Polyphagia 06/16/2020   Endometrial hyperplasia, simple 01/18/2022   Generalized anxiety disorder 01/27/2022   Elevated blood pressure reading 01/27/2022    Endometrial hyperplasia 04/07/2022   Status post hysterectomy 04/07/2022   Dysuria 04/20/2022   Resolved Ambulatory Problems    Diagnosis Date Noted   Prediabetes 03/20/2020   Past Medical History:  Diagnosis Date   Anxiety    Asthma    Complication of anesthesia    COVID-19 04/2019   Depression    Eczema    Fibromyalgia    GERD (gastroesophageal reflux disease)    PONV (postoperative nausea and vomiting)    Swelling of both lower extremities    Wears contact lenses    Wears glasses       ROS See HPI.    Objective:     BP 119/75   Pulse 82   Ht '5\' 7"'$  (1.702 m)   Wt 274 lb (124.3 kg)   SpO2 96%   BMI 42.91 kg/m  BP Readings from Last 3 Encounters:  04/20/22 119/75  04/08/22 118/73  04/05/22 (!) 122/95   Wt Readings from Last 3 Encounters:  04/20/22 274 lb (124.3 kg)  04/07/22 272 lb 8 oz (123.6 kg)  04/05/22 276 lb 6 oz (125.4 kg)    .Marland Kitchen Results for orders placed or performed in visit on 04/20/22  POCT URINALYSIS DIP (CLINITEK)  Result Value Ref Range   Color, UA yellow yellow   Clarity, UA clear clear   Glucose, UA negative negative mg/dL   Bilirubin, UA negative negative   Ketones, POC UA negative negative mg/dL  Spec Grav, UA 1.025 1.010 - 1.025   Blood, UA moderate (A) negative   pH, UA 5.5 5.0 - 8.0   POC PROTEIN,UA =30 (A) negative, trace   Urobilinogen, UA 0.2 0.2 or 1.0 E.U./dL   Nitrite, UA Negative Negative   Leukocytes, UA Small (1+) (A) Negative     Physical Exam Constitutional:      Appearance: Normal appearance.  HENT:     Head: Normocephalic.  Cardiovascular:     Rate and Rhythm: Normal rate and regular rhythm.  Pulmonary:     Effort: Pulmonary effort is normal.     Breath sounds: Normal breath sounds.  Neurological:     General: No focal deficit present.     Mental Status: She is alert and oriented to person, place, and time.  Psychiatric:        Mood and Affect: Mood normal.         Assessment & Plan:   Marland KitchenMarland KitchenTakeia was seen today for obesity and follow-up.  Diagnoses and all orders for this visit:  Vaginal burning -     POCT URINALYSIS DIP (CLINITEK) -     Urine Culture -     SureSwab Advanced Vaginitis Plus,TMA  Class 3 severe obesity due to excess calories without serious comorbidity with body mass index (BMI) of 40.0 to 44.9 in adult [facility])  Status post hysterectomy  Dysuria  Other orders -     Discontinue: fluconazole (DIFLUCAN) 150 MG tablet; Take 1 tablet (150 mg total) by mouth once for 1 dose. -     fluconazole (DIFLUCAN) 150 MG tablet; Take 1 tablet (150 mg total) by mouth once for 1 dose. -     ferrous sulfate 325 (65 FE) MG EC tablet; Take 1 tablet (325 mg total) by mouth daily with breakfast. -     Vitamin D, Ergocalciferol, (DRISDOL) 1.25 MG (50000 UNIT) CAPS capsule; Take 1 capsule (50,000 Units total) by mouth every 7 (seven) days.   Lost 10lbs and tolerating wegovy well Increase to '1mg'$  weekly ok to use zofran for any nausea  Call if tolerating and will send 1.'7mg'$  weekly for next dose UA with blood, protein, leukocytes Will send for culture Hx of yeast infection will send diflucan Has been using summers eve and some concern for BV Will order self sure swab Continue using tylenol and ibuprofen for pain Follow up sooner with GYN with fever, chills.    Iran Planas, PA-C

## 2022-04-20 NOTE — Patient Instructions (Signed)
Diflucan given to pharmacy.  Go ahead and increase to '1mg'$  of wegovy.  Let me know if tolerate and can send 1.'7mg'$ .

## 2022-04-21 ENCOUNTER — Encounter: Payer: Self-pay | Admitting: Physician Assistant

## 2022-04-21 ENCOUNTER — Other Ambulatory Visit (HOSPITAL_COMMUNITY): Payer: Self-pay

## 2022-04-21 LAB — URINE CULTURE
MICRO NUMBER:: 14620346
Result:: NO GROWTH
SPECIMEN QUALITY:: ADEQUATE

## 2022-04-21 LAB — SURESWAB® ADVANCED VAGINITIS PLUS,TMA
C. trachomatis RNA, TMA: NOT DETECTED
CANDIDA SPECIES: NOT DETECTED
Candida glabrata: NOT DETECTED
N. gonorrhoeae RNA, TMA: NOT DETECTED
SURESWAB(R) ADV BACTERIAL VAGINOSIS(BV),TMA: NEGATIVE
TRICHOMONAS VAGINALIS (TV),TMA: NOT DETECTED

## 2022-04-21 NOTE — Telephone Encounter (Signed)
Can we give her one sample pen of ozempic? To titrate her to the '1mg'$  dose.

## 2022-04-23 NOTE — Progress Notes (Signed)
Negative for BV, yeast.

## 2022-05-03 ENCOUNTER — Encounter: Payer: Self-pay | Admitting: Obstetrics and Gynecology

## 2022-05-04 MED ORDER — WEGOVY 1.7 MG/0.75ML ~~LOC~~ SOAJ: 1.7000 mg | SUBCUTANEOUS | 0 refills | Status: DC

## 2022-05-07 ENCOUNTER — Ambulatory Visit: Payer: BC Managed Care – PPO | Admitting: Plastic Surgery

## 2022-05-12 ENCOUNTER — Encounter: Payer: Self-pay | Admitting: Obstetrics and Gynecology

## 2022-05-12 ENCOUNTER — Ambulatory Visit (INDEPENDENT_AMBULATORY_CARE_PROVIDER_SITE_OTHER): Payer: BC Managed Care – PPO | Admitting: Obstetrics and Gynecology

## 2022-05-12 VITALS — BP 107/73 | HR 75 | Ht 67.0 in | Wt 269.0 lb

## 2022-05-12 DIAGNOSIS — Z9889 Other specified postprocedural states: Secondary | ICD-10-CM

## 2022-05-12 DIAGNOSIS — M6289 Other specified disorders of muscle: Secondary | ICD-10-CM

## 2022-05-12 NOTE — Progress Notes (Signed)
   RETURN GYNECOLOGY VISIT - POST OPERATIVE  Subjective:  Kimberly Mckay is a 42 y.o. who is 5 weeks s/p TLH/BS/cysto presenting for post op care.   Reports a difficult recovery since surgery. Did not realize her uterus would be removed through her vagina and is frustrated with how much vaginal and rectal pain she's had since surgery. Vaginal pain is intermittent. Has an intermittent abdominal pain between her umbilicus and her pubic bone. Having a lot of fatigue. Voiding spontaneously, tolerating a regular diet, having bowel movements but struggling with constipation (on [place])  Pathology was reviewed - benign uterus, tubes and cervix.  FINAL MICROSCOPIC DIAGNOSIS:   A. FALLOPIAN TUBE, LEFT, SALPINGECTOMY:  -  Overall unremarkable fallopian tube with incidental paratubal cyst.   B. FALLOPIAN TUBE, RIGHT, SALPINGECTOMY:  -  Unremarkable fallopian tube.   C. UTERUS AND CERVIX, HYSTERECTOMY:  -  Unremarkable cervix, negative for dysplasia.  -  Disordered proliferative endometrium with stromal  pseudodecidualization suggestive of steroid effect, negative for  atypia/hyperplasia.  - Unremarkable myometrium.   Objective:   Vitals:   05/12/22 0822  BP: 107/73  Pulse: 75  Weight: 269 lb (122 kg)  Height: 5\' 7"  (1.702 m)    General:  Alert, oriented and cooperative. Patient is in no acute distress.  Skin: Skin is warm and dry. No rash noted.   Cardiovascular: Normal heart rate noted  Respiratory: Normal respiratory effort, no problems with respiration noted  Abdomen: Soft, mild tenderness between the umbilical and suprapubic port sites. No rebound or guarding. Incisions are well healed.  Pelvic: Declined   Assessment and Plan:  Kimberly Mckay is a 42 y.o. who is 5 weeks s/p TLH/BS/cysto presenting for post op care.   1. Post-operative state Resume normal activity. No lifting restrictions Continue pelvic rest for a total of 12 weeks postoperatively. Discussed that  she will no longer require paps Recommended pelvic floor physical therapy - minimize internal component until 12 weeks post op  2. Pelvic floor dysfunction - Ambulatory referral to Physical Therapy  Return if symptoms worsen or fail to improve.  Future Appointments  Date Time Provider [place]  05/20/2022 12:50 PM GI-BCG MM 2 GI-BCGMM GI-BREAST CE  06/04/2022 11:15 AM Dillingham, Loel Lofty, DO PSS-PSS None  07/15/2022  1:00 PM Donella Stade, PA-C PCK-PCK None   Inez Catalina, MD

## 2022-05-18 ENCOUNTER — Ambulatory Visit: Payer: Self-pay | Admitting: Physical Therapy

## 2022-05-19 ENCOUNTER — Encounter: Payer: Self-pay | Admitting: Family Medicine

## 2022-05-19 ENCOUNTER — Ambulatory Visit (INDEPENDENT_AMBULATORY_CARE_PROVIDER_SITE_OTHER): Payer: BC Managed Care – PPO | Admitting: Family Medicine

## 2022-05-19 VITALS — BP 118/75 | HR 77 | Ht 67.0 in | Wt 269.2 lb

## 2022-05-19 DIAGNOSIS — R300 Dysuria: Secondary | ICD-10-CM

## 2022-05-19 DIAGNOSIS — N3000 Acute cystitis without hematuria: Secondary | ICD-10-CM

## 2022-05-19 LAB — POCT URINALYSIS DIP (CLINITEK)
Bilirubin, UA: NEGATIVE
Blood, UA: NEGATIVE
Glucose, UA: 100 mg/dL — AB
Leukocytes, UA: NEGATIVE
Nitrite, UA: POSITIVE — AB
POC PROTEIN,UA: 30 — AB
Spec Grav, UA: 1.025 (ref 1.010–1.025)
Urobilinogen, UA: 1 E.U./dL
pH, UA: 5 (ref 5.0–8.0)

## 2022-05-19 MED ORDER — NITROFURANTOIN MONOHYD MACRO 100 MG PO CAPS: 100.0000 mg | ORAL_CAPSULE | Freq: Two times a day (BID) | ORAL | 0 refills | Status: DC

## 2022-05-19 NOTE — Progress Notes (Signed)
Established Patient Office Visit  Subjective   Patient ID: Marzella Biglow, female    DOB: August 03, 1980  Age: 42 y.o. MRN: [id number]  Chief Complaint  Patient presents with   Dysuria   Urinary Urgency    She states that this started 2 days ago. It is especially worse at night when she goes to bed. She states that she is on medicine for bladder spasms. She states that she has frequent UTI's and bladders spasms. She denies blood in urine, fever, nausea or vomiting.     HPI  Presents today for an acute visit with complaint of urinary frequency and urgency worse at night. Pain is worse when urine stream stops.  Symptoms have been present for 2 days, worse at night.  Associated symptoms include: has dermatitis in vaginal area per surgeon (to note)  Pertinent negatives: no fever or chills, no flank pain  Pain severity: 7/10 in urethra  Treatments tried include : pyridium per surgeon  Treatment effective : has only taken one dose, unsure if this has helped.  Chart review:  04/07/22: total abdominal hysterectomy.  05/12/22: post op visit with surgeon.    Review of Systems  Constitutional:  Negative for chills and fever.  Genitourinary:  Positive for frequency and urgency. Negative for flank pain and hematuria.  Psychiatric/Behavioral:  Negative for suicidal ideas.       Objective:     BP 118/75   Pulse 77   Ht 5\' 7"  (1.702 m)   Wt 269 lb 3.2 oz (122.1 kg)   LMP  (LMP Unknown) Comment: Patient has been bleeding continously for months.  SpO2 98%   BMI 42.16 kg/m  BP Readings from Last 3 Encounters:  05/19/22 118/75  05/12/22 107/73  04/20/22 119/75      Physical Exam Vitals and nursing note reviewed.  Constitutional:      Appearance: Normal appearance. She is obese.  Cardiovascular:     Rate and Rhythm: Regular rhythm.     Heart sounds: Normal heart sounds.  Pulmonary:     Effort: Pulmonary effort is normal.     Breath sounds: Normal breath sounds.  Abdominal:      Tenderness: There is abdominal tenderness (suprapubic area). There is no right CVA tenderness or left CVA tenderness.     Comments: S/p abdominal hysterectomy one month ago, continues to have surgical pain.  Skin:    General: Skin is dry.     Capillary Refill: Capillary refill takes less than 2 seconds.  Neurological:     General: No focal deficit present.     Mental Status: She is alert. Mental status is at baseline.  Psychiatric:        Mood and Affect: Mood normal.        Behavior: Behavior normal.        Thought Content: Thought content normal.        Judgment: Judgment normal.      Results for orders placed or performed in visit on 05/19/22  POCT URINALYSIS DIP (CLINITEK)  Result Value Ref Range   Color, UA orange (A) yellow   Clarity, UA clear clear   Glucose, UA =100 (A) negative mg/dL   Bilirubin, UA negative negative   Ketones, POC UA trace (5) (A) negative mg/dL   Spec Grav, UA 1.025 1.010 - 1.025   Blood, UA negative negative   pH, UA 5.0 5.0 - 8.0   POC PROTEIN,UA =30 (A) negative, trace   Urobilinogen, UA 1.0 0.2  or 1.0 E.U./dL   Nitrite, UA Positive (A) Negative   Leukocytes, UA Negative Negative      The 10-year ASCVD risk score (Arnett DK, et al., 2019) is: 1.2%    Assessment & Plan:   Problem List Items Addressed This Visit     Dysuria   Relevant Orders   POCT URINALYSIS DIP (CLINITEK) (Completed)   Acute cystitis without hematuria - Primary  Urinary frequency and urgency worse at night. Urine dip positive for nitrites. No fever or chills, no flank pain. She does have abdominal tenderness she is s/p abdominal hysterectomy one  month ago.  Will send urine for culture. She will start nitrofurantoin 100 mg BID for 10 days. Increase water intake.    Relevant Medications   nitrofurantoin, macrocrystal-monohydrate, (MACROBID) 100 MG capsule   Other Relevant Orders   Urine Culture  Agrees with plan of care discussed.  Questions answered. No  interaction found with Macrobid and [id number].  Follow-up at this office or with PCP if symptoms do not improve.   Return if symptoms worsen or fail to improve.    Chalmers Guest, FNP

## 2022-05-19 NOTE — Patient Instructions (Signed)
You were prescribed an antibiotic today. Please complete the full course.  Acetaminophen or ibuprofen for fever according to package instructions, do not exceed recommended doses.  Adequate fluids to avoid dehydration. Lots of rest while you are recovering.  Follow-up if your symptoms do not improve.     

## 2022-05-20 ENCOUNTER — Ambulatory Visit: Payer: BC Managed Care – PPO

## 2022-05-21 LAB — URINE CULTURE

## 2022-05-24 ENCOUNTER — Encounter: Payer: Self-pay | Admitting: Family Medicine

## 2022-05-28 ENCOUNTER — Ambulatory Visit: Payer: BC Managed Care – PPO | Admitting: Physician Assistant

## 2022-05-31 MED ORDER — SEMAGLUTIDE-WEIGHT MANAGEMENT 2.4 MG/0.75ML ~~LOC~~ SOAJ: 2.4000 mg | SUBCUTANEOUS | 0 refills | Status: DC

## 2022-05-31 NOTE — Addendum Note (Signed)
Addended by: Jomarie Longs on: 05/31/2022 08:58 AM   Modules accepted: Orders

## 2022-05-31 NOTE — Addendum Note (Signed)
Addended by: Jomarie Longs on: 05/31/2022 03:56 PM   Modules accepted: Orders

## 2022-05-31 NOTE — Addendum Note (Signed)
Addended by: Chalmers Cater on: 05/31/2022 08:29 AM   Modules accepted: Orders

## 2022-06-04 ENCOUNTER — Ambulatory Visit: Payer: BC Managed Care – PPO | Admitting: Plastic Surgery

## 2022-06-04 ENCOUNTER — Telehealth: Payer: Self-pay | Admitting: Emergency Medicine

## 2022-06-04 ENCOUNTER — Ambulatory Visit: Payer: Self-pay

## 2022-06-04 DIAGNOSIS — S0990XA Unspecified injury of head, initial encounter: Secondary | ICD-10-CM | POA: Diagnosis not present

## 2022-06-04 DIAGNOSIS — W208XXA Other cause of strike by thrown, projected or falling object, initial encounter: Secondary | ICD-10-CM | POA: Diagnosis not present

## 2022-06-04 DIAGNOSIS — Z043 Encounter for examination and observation following other accident: Secondary | ICD-10-CM | POA: Diagnosis not present

## 2022-06-04 DIAGNOSIS — S0083XA Contusion of other part of head, initial encounter: Secondary | ICD-10-CM | POA: Diagnosis not present

## 2022-06-04 DIAGNOSIS — M542 Cervicalgia: Secondary | ICD-10-CM | POA: Diagnosis not present

## 2022-06-04 DIAGNOSIS — S4992XA Unspecified injury of left shoulder and upper arm, initial encounter: Secondary | ICD-10-CM | POA: Diagnosis not present

## 2022-06-04 DIAGNOSIS — M25512 Pain in left shoulder: Secondary | ICD-10-CM | POA: Diagnosis not present

## 2022-06-04 DIAGNOSIS — S025XXA Fracture of tooth (traumatic), initial encounter for closed fracture: Secondary | ICD-10-CM | POA: Diagnosis not present
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
## 2022-06-21 ENCOUNTER — Ambulatory Visit: Payer: BC Managed Care – PPO | Admitting: Physical Therapy

## 2022-07-02 ENCOUNTER — Ambulatory Visit: Payer: BC Managed Care – PPO

## 2022-07-14 ENCOUNTER — Encounter: Payer: Self-pay | Admitting: Physician Assistant

## 2022-07-14 ENCOUNTER — Ambulatory Visit (INDEPENDENT_AMBULATORY_CARE_PROVIDER_SITE_OTHER): Payer: BC Managed Care – PPO | Admitting: Physician Assistant

## 2022-07-14 ENCOUNTER — Other Ambulatory Visit (HOSPITAL_COMMUNITY): Payer: Self-pay

## 2022-07-14 VITALS — BP 120/80 | HR 80 | Ht 67.0 in | Wt 257.0 lb

## 2022-07-14 DIAGNOSIS — T887XXA Unspecified adverse effect of drug or medicament, initial encounter: Secondary | ICD-10-CM | POA: Diagnosis not present

## 2022-07-14 DIAGNOSIS — R5383 Other fatigue: Secondary | ICD-10-CM | POA: Insufficient documentation

## 2022-07-14 DIAGNOSIS — E538 Deficiency of other specified B group vitamins: Secondary | ICD-10-CM | POA: Diagnosis not present

## 2022-07-14 MED ORDER — ONDANSETRON 8 MG PO TBDP
8.0000 mg | ORAL_TABLET | Freq: Three times a day (TID) | ORAL | 3 refills | Status: DC | PRN
Start: 2022-07-14 — End: 2022-07-14
  Filled 2022-07-14: qty 20, 30d supply, fill #0

## 2022-07-14 MED ORDER — ONDANSETRON 8 MG PO TBDP: 8.0000 mg | ORAL_TABLET | Freq: Three times a day (TID) | ORAL | 3 refills | Status: DC | PRN

## 2022-07-14 MED ORDER — CYANOCOBALAMIN 1000 MCG/ML IJ SOLN
1000.0000 ug | Freq: Once | INTRAMUSCULAR | Status: AC
Start: 2022-07-14 — End: 2022-07-14
  Administered 2022-07-14: 1000 ug via INTRAMUSCULAR

## 2022-07-14 MED ORDER — SEMAGLUTIDE-WEIGHT MANAGEMENT 2.4 MG/0.75ML ~~LOC~~ SOAJ: 2.4000 mg | SUBCUTANEOUS | 1 refills | Status: DC

## 2022-07-14 NOTE — Patient Instructions (Signed)
Vitamin D 5000 units a day B12  Folic acid 

## 2022-07-14 NOTE — Progress Notes (Signed)
Established Patient Office Visit  Subjective   Patient ID: Kimberly Mckay, female    DOB: Apr 30, 1980  Age: 42 y.o. MRN: [id number]  Chief Complaint  Patient presents with   Follow-up    HPI Pt is a 42 yo obese female who presents to the clinic to follow-up on weight loss with ONGEXB.  Patient is doing amazing on Wegovy 2.4 mg weekly.  In the last 3 months she has lost another 12 pounds.  She notices that she is eating less and able to be a little more active.  She denies any constipation issues.  She does have some skin sensitivity but not bothersome.  Her goal weight is 200.  She occasionally does get nauseated after an injection or if she eats the wrong thing.  She does request a refill for Zofran.  She has noticed her energy is low.  She is not taking vitamin D, folate or B12.  All of those have been low in the past.   .. Active Ambulatory Problems    Diagnosis Date Noted   Other fatigue 03/20/2020   SOBOE (shortness of breath on exertion) 03/20/2020   Mood disorder (HCC)- emotional eating 03/20/2020   Anemia 03/20/2020   Other constipation 03/20/2020   At risk for impaired metabolic function 03/20/2020   Vitamin D deficiency 04/02/2020   At risk for diabetes mellitus 04/02/2020   Absolute anemia 04/02/2020   Insulin resistance 04/15/2020   Class 3 severe obesity due to excess calories without serious comorbidity with body mass index (BMI) of 40.0 to 44.9 in adult [facility]) 04/15/2020   Back pain 04/18/2020   Neck pain 04/18/2020   Symptomatic mammary hypertrophy 04/18/2020   Polyphagia 06/16/2020   Endometrial hyperplasia, simple 01/18/2022   Generalized anxiety disorder 01/27/2022   Elevated blood pressure reading 01/27/2022   Endometrial hyperplasia 04/07/2022   Status post hysterectomy 04/07/2022   Dysuria 04/20/2022   Acute cystitis without hematuria 05/19/2022   B12 deficiency 07/14/2022   No energy 07/14/2022   Resolved Ambulatory Problems    Diagnosis Date  Noted   Prediabetes 03/20/2020   Past Medical History:  Diagnosis Date   Anxiety    Asthma    Complication of anesthesia    COVID-19 04/2019   Depression    Eczema    Fibromyalgia    GERD (gastroesophageal reflux disease)    PONV (postoperative nausea and vomiting)    Swelling of both lower extremities    Wears contact lenses    Wears glasses      Review of Systems  All other systems reviewed and are negative.     Objective:     BP 120/80   Pulse 80   Ht 5\' 7"  (1.702 m)   Wt 257 lb (116.6 kg)   LMP  (LMP Unknown) Comment: Patient has been bleeding continously for months.  SpO2 99%   BMI 40.25 kg/m  BP Readings from Last 3 Encounters:  07/14/22 120/80  05/19/22 118/75  05/12/22 107/73   Wt Readings from Last 3 Encounters:  07/14/22 257 lb (116.6 kg)  05/19/22 269 lb 3.2 oz (122.1 kg)  05/12/22 269 lb (122 kg)      Physical Exam Constitutional:      Appearance: Normal appearance. She is obese.  HENT:     Head: Normocephalic.  Cardiovascular:     Rate and Rhythm: Normal rate and regular rhythm.  Pulmonary:     Effort: Pulmonary effort is normal.     Breath sounds:  Normal breath sounds.  Neurological:     General: No focal deficit present.     Mental Status: She is alert and oriented to person, place, and time.  Psychiatric:        Mood and Affect: Mood normal.        The 10-year ASCVD risk score (Arnett DK, et al., 2019) is: 1.2%    Assessment & Plan:  Marland KitchenMarland KitchenTrini was seen today for follow-up.  Diagnoses and all orders for this visit:  Class 3 severe obesity due to excess calories without serious comorbidity with body mass index (BMI) of 40.0 to 44.9 in adult [facility]) -     Semaglutide-Weight Management 2.4 MG/0.75ML SOAJ; Inject 2.4 mg into the skin once a week.  B12 deficiency -     cyanocobalamin (VITAMIN B12) injection 1,000 mcg  No energy  Medication side effect -     ondansetron (ZOFRAN-ODT) 8 MG disintegrating tablet; Take 1  tablet (8 mg total) by mouth every 8 (eight) hours as needed for nausea.   Patient is doing great with weight loss.  Continue with Wegovy 2.4 mg for the next 6 months.  Try to incorporate more activity at least 150 minutes a week.  Encouraged her to do some low resistance training to help with building muscle. Zofran for as needed nausea.   Discussed some of her low energy could be her calorie deficit.  She does have a history of B12 deficiency, vitamin D deficiency, folate deficiency.  I encouraged her to start over-the-counter vitamins for this.  We did give her a B12 shot today in office.  Increasing her exercise can also help with energy.  She is sleeping well. Vitamin D 5000 units B12 Folic acid  Tandy Gaw, PA-C

## 2022-07-15 ENCOUNTER — Ambulatory Visit: Payer: BC Managed Care – PPO | Admitting: Physician Assistant

## 2022-07-16 ENCOUNTER — Other Ambulatory Visit (HOSPITAL_COMMUNITY): Payer: Self-pay

## 2022-08-19 DIAGNOSIS — M25512 Pain in left shoulder: Secondary | ICD-10-CM | POA: Diagnosis not present

## 2022-08-19 DIAGNOSIS — M7542 Impingement syndrome of left shoulder: Secondary | ICD-10-CM | POA: Diagnosis not present

## 2022-09-09 ENCOUNTER — Encounter: Payer: Self-pay | Admitting: Physician Assistant

## 2022-09-23 ENCOUNTER — Telehealth: Payer: Self-pay

## 2022-09-23 NOTE — Telephone Encounter (Addendum)
Initiated Prior authorization NGE:XBMWUX 2.4MG /0.75ML auto-injectors Via: Covermymeds Case/Key:[id number] Status: approved  as of 09/23/22 Reason:Authorization Expiration Date: March 27, 2023. Notified Pt via: pt Informed via phone call, preferred, pt is aware she is approved till Mar 27 2023, and need to schedule a fup appt with pcp for medication management

## 2022-10-28 ENCOUNTER — Other Ambulatory Visit: Payer: Self-pay | Admitting: Physician Assistant

## 2022-10-28 DIAGNOSIS — Z0000 Encounter for general adult medical examination without abnormal findings: Secondary | ICD-10-CM

## 2022-10-28 DIAGNOSIS — M7542 Impingement syndrome of left shoulder: Secondary | ICD-10-CM | POA: Diagnosis not present

## 2022-11-02 ENCOUNTER — Ambulatory Visit: Payer: BC Managed Care – PPO

## 2022-11-05 DIAGNOSIS — M25812 Other specified joint disorders, left shoulder: Secondary | ICD-10-CM | POA: Diagnosis not present

## 2022-11-12 DIAGNOSIS — M19012 Primary osteoarthritis, left shoulder: Secondary | ICD-10-CM | POA: Diagnosis not present

## 2022-11-12 DIAGNOSIS — M7522 Bicipital tendinitis, left shoulder: Secondary | ICD-10-CM | POA: Diagnosis not present

## 2022-11-12 DIAGNOSIS — M7542 Impingement syndrome of left shoulder: Secondary | ICD-10-CM | POA: Diagnosis not present

## 2022-11-24 DIAGNOSIS — M25712 Osteophyte, left shoulder: Secondary | ICD-10-CM | POA: Diagnosis not present

## 2022-11-24 DIAGNOSIS — Z7985 Long-term (current) use of injectable non-insulin antidiabetic drugs: Secondary | ICD-10-CM | POA: Diagnosis not present

## 2022-11-24 DIAGNOSIS — Z882 Allergy status to sulfonamides status: Secondary | ICD-10-CM | POA: Diagnosis not present

## 2022-11-24 DIAGNOSIS — Z7984 Long term (current) use of oral hypoglycemic drugs: Secondary | ICD-10-CM | POA: Diagnosis not present

## 2022-11-24 DIAGNOSIS — M7522 Bicipital tendinitis, left shoulder: Secondary | ICD-10-CM | POA: Diagnosis not present

## 2022-11-24 DIAGNOSIS — E119 Type 2 diabetes mellitus without complications: Secondary | ICD-10-CM | POA: Diagnosis not present

## 2022-11-24 DIAGNOSIS — Z91148 Patient's other noncompliance with medication regimen for other reason: Secondary | ICD-10-CM | POA: Diagnosis not present

## 2022-11-24 DIAGNOSIS — K219 Gastro-esophageal reflux disease without esophagitis: Secondary | ICD-10-CM | POA: Diagnosis not present

## 2022-11-24 DIAGNOSIS — E669 Obesity, unspecified: Secondary | ICD-10-CM | POA: Diagnosis not present

## 2022-11-24 DIAGNOSIS — Z87891 Personal history of nicotine dependence: Secondary | ICD-10-CM | POA: Diagnosis not present

## 2022-11-24 DIAGNOSIS — Z886 Allergy status to analgesic agent status: Secondary | ICD-10-CM | POA: Diagnosis not present

## 2022-11-24 DIAGNOSIS — G8918 Other acute postprocedural pain: Secondary | ICD-10-CM | POA: Diagnosis not present

## 2022-11-24 DIAGNOSIS — X58XXXA Exposure to other specified factors, initial encounter: Secondary | ICD-10-CM | POA: Diagnosis not present

## 2022-11-24 DIAGNOSIS — S43432A Superior glenoid labrum lesion of left shoulder, initial encounter: Secondary | ICD-10-CM | POA: Diagnosis not present

## 2022-11-24 DIAGNOSIS — Z888 Allergy status to other drugs, medicaments and biological substances status: Secondary | ICD-10-CM | POA: Diagnosis not present

## 2022-11-24 DIAGNOSIS — M19012 Primary osteoarthritis, left shoulder: Secondary | ICD-10-CM | POA: Diagnosis not present

## 2022-11-24 DIAGNOSIS — J45909 Unspecified asthma, uncomplicated: Secondary | ICD-10-CM | POA: Diagnosis not present

## 2022-11-24 DIAGNOSIS — M7542 Impingement syndrome of left shoulder: Secondary | ICD-10-CM | POA: Diagnosis not present

## 2022-11-24 DIAGNOSIS — Z885 Allergy status to narcotic agent status: Secondary | ICD-10-CM | POA: Diagnosis not present

## 2022-11-24 DIAGNOSIS — M25812 Other specified joint disorders, left shoulder: Secondary | ICD-10-CM | POA: Diagnosis not present

## 2022-11-24 DIAGNOSIS — Z6835 Body mass index (BMI) 35.0-35.9, adult: Secondary | ICD-10-CM | POA: Diagnosis not present

## 2022-11-25 DIAGNOSIS — M7522 Bicipital tendinitis, left shoulder: Secondary | ICD-10-CM | POA: Diagnosis not present

## 2022-11-25 DIAGNOSIS — M7542 Impingement syndrome of left shoulder: Secondary | ICD-10-CM | POA: Diagnosis not present

## 2022-11-25 DIAGNOSIS — M19012 Primary osteoarthritis, left shoulder: Secondary | ICD-10-CM | POA: Diagnosis not present

## 2022-12-02 DIAGNOSIS — M7522 Bicipital tendinitis, left shoulder: Secondary | ICD-10-CM | POA: Diagnosis not present

## 2022-12-02 DIAGNOSIS — M7542 Impingement syndrome of left shoulder: Secondary | ICD-10-CM | POA: Diagnosis not present

## 2022-12-02 DIAGNOSIS — M19012 Primary osteoarthritis, left shoulder: Secondary | ICD-10-CM | POA: Diagnosis not present

## 2022-12-06 DIAGNOSIS — M7522 Bicipital tendinitis, left shoulder: Secondary | ICD-10-CM | POA: Diagnosis not present

## 2022-12-06 DIAGNOSIS — M19012 Primary osteoarthritis, left shoulder: Secondary | ICD-10-CM | POA: Diagnosis not present

## 2022-12-06 DIAGNOSIS — M7542 Impingement syndrome of left shoulder: Secondary | ICD-10-CM | POA: Diagnosis not present

## 2022-12-09 DIAGNOSIS — M7542 Impingement syndrome of left shoulder: Secondary | ICD-10-CM | POA: Diagnosis not present

## 2022-12-09 DIAGNOSIS — M19012 Primary osteoarthritis, left shoulder: Secondary | ICD-10-CM | POA: Diagnosis not present

## 2022-12-09 DIAGNOSIS — M7522 Bicipital tendinitis, left shoulder: Secondary | ICD-10-CM | POA: Diagnosis not present

## 2022-12-16 DIAGNOSIS — M19012 Primary osteoarthritis, left shoulder: Secondary | ICD-10-CM | POA: Diagnosis not present

## 2022-12-16 DIAGNOSIS — M7522 Bicipital tendinitis, left shoulder: Secondary | ICD-10-CM | POA: Diagnosis not present

## 2022-12-16 DIAGNOSIS — M7542 Impingement syndrome of left shoulder: Secondary | ICD-10-CM | POA: Diagnosis not present

## 2022-12-23 DIAGNOSIS — M7522 Bicipital tendinitis, left shoulder: Secondary | ICD-10-CM | POA: Diagnosis not present

## 2022-12-23 DIAGNOSIS — M19012 Primary osteoarthritis, left shoulder: Secondary | ICD-10-CM | POA: Diagnosis not present

## 2022-12-23 DIAGNOSIS — M7542 Impingement syndrome of left shoulder: Secondary | ICD-10-CM | POA: Diagnosis not present

## 2022-12-27 DIAGNOSIS — M7522 Bicipital tendinitis, left shoulder: Secondary | ICD-10-CM | POA: Diagnosis not present

## 2022-12-27 DIAGNOSIS — M19012 Primary osteoarthritis, left shoulder: Secondary | ICD-10-CM | POA: Diagnosis not present

## 2022-12-27 DIAGNOSIS — Z4889 Encounter for other specified surgical aftercare: Secondary | ICD-10-CM | POA: Diagnosis not present

## 2022-12-27 DIAGNOSIS — M25812 Other specified joint disorders, left shoulder: Secondary | ICD-10-CM | POA: Diagnosis not present

## 2023-01-03 DIAGNOSIS — Z4889 Encounter for other specified surgical aftercare: Secondary | ICD-10-CM | POA: Diagnosis not present

## 2023-01-03 DIAGNOSIS — M25812 Other specified joint disorders, left shoulder: Secondary | ICD-10-CM | POA: Diagnosis not present

## 2023-01-03 DIAGNOSIS — M7522 Bicipital tendinitis, left shoulder: Secondary | ICD-10-CM | POA: Diagnosis not present

## 2023-01-03 DIAGNOSIS — M19012 Primary osteoarthritis, left shoulder: Secondary | ICD-10-CM | POA: Diagnosis not present

## 2023-01-06 DIAGNOSIS — M7522 Bicipital tendinitis, left shoulder: Secondary | ICD-10-CM | POA: Diagnosis not present

## 2023-01-06 DIAGNOSIS — M25812 Other specified joint disorders, left shoulder: Secondary | ICD-10-CM | POA: Diagnosis not present

## 2023-01-06 DIAGNOSIS — M19012 Primary osteoarthritis, left shoulder: Secondary | ICD-10-CM | POA: Diagnosis not present

## 2023-01-06 DIAGNOSIS — Z4889 Encounter for other specified surgical aftercare: Secondary | ICD-10-CM | POA: Diagnosis not present

## 2023-01-13 DIAGNOSIS — M19012 Primary osteoarthritis, left shoulder: Secondary | ICD-10-CM | POA: Diagnosis not present

## 2023-01-13 DIAGNOSIS — M7522 Bicipital tendinitis, left shoulder: Secondary | ICD-10-CM | POA: Diagnosis not present

## 2023-01-13 DIAGNOSIS — M25812 Other specified joint disorders, left shoulder: Secondary | ICD-10-CM | POA: Diagnosis not present

## 2023-01-13 DIAGNOSIS — Z4889 Encounter for other specified surgical aftercare: Secondary | ICD-10-CM | POA: Diagnosis not present

## 2023-01-14 ENCOUNTER — Ambulatory Visit (INDEPENDENT_AMBULATORY_CARE_PROVIDER_SITE_OTHER): Payer: BC Managed Care – PPO | Admitting: Physician Assistant

## 2023-01-14 ENCOUNTER — Encounter: Payer: Self-pay | Admitting: Physician Assistant

## 2023-01-14 VITALS — BP 127/83 | HR 60 | Ht 67.0 in | Wt 220.0 lb

## 2023-01-14 DIAGNOSIS — E538 Deficiency of other specified B group vitamins: Secondary | ICD-10-CM | POA: Diagnosis not present

## 2023-01-14 DIAGNOSIS — E6609 Other obesity due to excess calories: Secondary | ICD-10-CM

## 2023-01-14 DIAGNOSIS — E559 Vitamin D deficiency, unspecified: Secondary | ICD-10-CM

## 2023-01-14 DIAGNOSIS — Z131 Encounter for screening for diabetes mellitus: Secondary | ICD-10-CM

## 2023-01-14 DIAGNOSIS — E66811 Obesity, class 1: Secondary | ICD-10-CM | POA: Insufficient documentation

## 2023-01-14 DIAGNOSIS — Z79899 Other long term (current) drug therapy: Secondary | ICD-10-CM

## 2023-01-14 DIAGNOSIS — D649 Anemia, unspecified: Secondary | ICD-10-CM

## 2023-01-14 DIAGNOSIS — Z6834 Body mass index (BMI) 34.0-34.9, adult: Secondary | ICD-10-CM | POA: Diagnosis not present

## 2023-01-14 DIAGNOSIS — Z13220 Encounter for screening for lipoid disorders: Secondary | ICD-10-CM

## 2023-01-14 NOTE — Progress Notes (Signed)
Established Patient Office Visit  Subjective   Patient ID: Kimberly Mckay, female    DOB: [birth year]  Age: 42 y.o. MRN: [id number]  Chief Complaint  Patient presents with   Medical Management of Chronic Issues    Wgt management 37 pounds down from 07/14/22    HPI Pt is a 42 yo obese female who presents to the clinic to follow up on wegovy. She is doing great on weight loss. She has lost 37lbs since 06/2022. She has lost 80lbs total. She is walking for exercise. She denies any side effects or concerns with medication. She has refills until march 2025. She does want to recheck her labs and see how they are trending.    .. Active Ambulatory Problems    Diagnosis Date Noted   Other fatigue 03/20/2020   SOBOE (shortness of breath on exertion) 03/20/2020   Mood disorder (HCC)- emotional eating 03/20/2020   Anemia 03/20/2020   Other constipation 03/20/2020   At risk for impaired metabolic function 03/20/2020   Vitamin D deficiency 04/02/2020   At risk for diabetes mellitus 04/02/2020   Absolute anemia 04/02/2020   Insulin resistance 04/15/2020   Class 3 severe obesity due to excess calories without serious comorbidity with body mass index (BMI) of 40.0 to 44.9 in adult [facility]) 04/15/2020   Back pain 04/18/2020   Neck pain 04/18/2020   Symptomatic mammary hypertrophy 04/18/2020   Polyphagia 06/16/2020   Endometrial hyperplasia, simple 01/18/2022   Generalized anxiety disorder 01/27/2022   Elevated blood pressure reading 01/27/2022   Endometrial hyperplasia 04/07/2022   Status post hysterectomy 04/07/2022   Dysuria 04/20/2022   Acute cystitis without hematuria 05/19/2022   B12 deficiency 07/14/2022   No energy 07/14/2022   Class 1 obesity due to excess calories without serious comorbidity with body mass index (BMI) of 34.0 to 34.9 in adult 01/14/2023   Resolved Ambulatory Problems    Diagnosis Date Noted   Prediabetes 03/20/2020   Past Medical History:  Diagnosis Date    Anxiety    Asthma    Complication of anesthesia    COVID-19 04/2019   Depression    Eczema    Fibromyalgia    GERD (gastroesophageal reflux disease)    PONV (postoperative nausea and vomiting)    Swelling of both lower extremities    Wears contact lenses    Wears glasses      Review of Systems  All other systems reviewed and are negative.     Objective:     BP 127/83   Pulse 60   Ht 5\' 7"  (1.702 m)   Wt 220 lb (99.8 kg)   LMP  (LMP Unknown) Comment: Patient has been bleeding continously for months.  SpO2 99%   BMI 34.46 kg/m  BP Readings from Last 3 Encounters:  01/14/23 127/83  07/14/22 120/80  05/19/22 118/75   Wt Readings from Last 3 Encounters:  01/14/23 220 lb (99.8 kg)  07/14/22 257 lb (116.6 kg)  05/19/22 269 lb 3.2 oz (122.1 kg)      Physical Exam Constitutional:      Appearance: Normal appearance. She is obese.  HENT:     Head: Normocephalic.  Cardiovascular:     Rate and Rhythm: Normal rate and regular rhythm.  Pulmonary:     Effort: Pulmonary effort is normal.     Breath sounds: Normal breath sounds.  Musculoskeletal:     Right lower leg: No edema.     Left lower leg: No edema.  Neurological:     General: No focal deficit present.     Mental Status: She is alert and oriented to person, place, and time.  Psychiatric:        Mood and Affect: Mood normal.        Assessment & Plan:  Marland KitchenMarland KitchenChristina "Chrissy" was seen today for medical management of chronic issues.  Diagnoses and all orders for this visit:  Class 1 obesity due to excess calories without serious comorbidity with body mass index (BMI) of 34.0 to 34.9 in adult -     Lipid panel -     CMP14+EGFR -     TSH -     VITAMIN D 25 Hydroxy (Vit-D Deficiency, Fractures) -     B12 and Folate Panel -     CBC w/Diff/Platelet -     Fe+TIBC+Fer  B12 deficiency -     B12 and Folate Panel  Anemia, unspecified type -     CBC w/Diff/Platelet -     Fe+TIBC+Fer  Vitamin D deficiency -      VITAMIN D 25 Hydroxy (Vit-D Deficiency, Fractures)  Medication management -     Lipid panel -     CMP14+EGFR -     TSH -     VITAMIN D 25 Hydroxy (Vit-D Deficiency, Fractures) -     B12 and Folate Panel -     CBC w/Diff/Platelet -     Fe+TIBC+Fer  Screening for diabetes mellitus -     CMP14+EGFR  Screening for lipid disorders -     Lipid panel   Labs ordered to recheck hemoglobin/vitamin D/kidney function/lipid level Next goal to get under 200 Increase exercise to 150 minutes a week Start slowly adding back in strength training  Pt declined all vaccines today covid and flu.    Return in about 3 months (around 04/25/2023).    Tandy Gaw, PA-C

## 2023-01-15 LAB — CBC WITH DIFFERENTIAL/PLATELET
Basophils Absolute: 0.1 10*3/uL (ref 0.0–0.2)
Basos: 1 %
EOS (ABSOLUTE): 0.1 10*3/uL (ref 0.0–0.4)
Eos: 1 %
Hematocrit: 45.5 % (ref 34.0–46.6)
Hemoglobin: 14.1 g/dL (ref 11.1–15.9)
Immature Grans (Abs): 0 10*3/uL (ref 0.0–0.1)
Immature Granulocytes: 0 %
Lymphocytes Absolute: 1.8 10*3/uL (ref 0.7–3.1)
Lymphs: 24 %
MCH: 25.5 pg — ABNORMAL LOW (ref 26.6–33.0)
MCHC: 31 g/dL — ABNORMAL LOW (ref 31.5–35.7)
MCV: 82 fL (ref 79–97)
Monocytes Absolute: 0.3 10*3/uL (ref 0.1–0.9)
Monocytes: 4 %
Neutrophils Absolute: 5.4 10*3/uL (ref 1.4–7.0)
Neutrophils: 70 %
Platelets: 270 10*3/uL (ref 150–450)
RBC: 5.54 x10E6/uL — ABNORMAL HIGH (ref 3.77–5.28)
RDW: 15.4 % (ref 11.7–15.4)
WBC: 7.7 10*3/uL (ref 3.4–10.8)

## 2023-01-15 LAB — CMP14+EGFR
ALT: 14 [IU]/L (ref 0–32)
AST: 23 [IU]/L (ref 0–40)
Albumin: 4.4 g/dL (ref 3.9–4.9)
Alkaline Phosphatase: 123 [IU]/L — ABNORMAL HIGH (ref 44–121)
BUN/Creatinine Ratio: 16 (ref 9–23)
BUN: 10 mg/dL (ref 6–24)
Bilirubin Total: 0.3 mg/dL (ref 0.0–1.2)
CO2: 20 mmol/L (ref 20–29)
Calcium: 9.7 mg/dL (ref 8.7–10.2)
Chloride: 104 mmol/L (ref 96–106)
Creatinine, Ser: 0.63 mg/dL (ref 0.57–1.00)
Globulin, Total: 3 g/dL (ref 1.5–4.5)
Glucose: 88 mg/dL (ref 70–99)
Potassium: 4.2 mmol/L (ref 3.5–5.2)
Sodium: 143 mmol/L (ref 134–144)
Total Protein: 7.4 g/dL (ref 6.0–8.5)
eGFR: 114 mL/min/{1.73_m2} (ref >59)

## 2023-01-15 LAB — B12 AND FOLATE PANEL
Folate: 4 ng/mL (ref >3.0)
Vitamin B-12: 416 pg/mL (ref 232–1245)

## 2023-01-15 LAB — IRON,TIBC AND FERRITIN PANEL
Ferritin: 49 ng/mL (ref 15–150)
Iron Saturation: 11 % — ABNORMAL LOW (ref 15–55)
Iron: 33 ug/dL (ref 27–159)
Total Iron Binding Capacity: 292 ug/dL (ref 250–450)
UIBC: 259 ug/dL (ref 131–425)

## 2023-01-15 LAB — LIPID PANEL
Chol/HDL Ratio: 3.5 ratio (ref 0.0–4.4)
Cholesterol, Total: 163 mg/dL (ref 100–199)
HDL: 47 mg/dL (ref >39)
LDL Chol Calc (NIH): 96 mg/dL (ref 0–99)
Triglycerides: 113 mg/dL (ref 0–149)
VLDL Cholesterol Cal: 20 mg/dL (ref 5–40)

## 2023-01-15 LAB — TSH: TSH: 1.38 u[IU]/mL (ref 0.450–4.500)

## 2023-01-15 LAB — VITAMIN D 25 HYDROXY (VIT D DEFICIENCY, FRACTURES): Vit D, 25-Hydroxy: 31.5 ng/mL (ref 30.0–100.0)

## 2023-01-17 DIAGNOSIS — M19012 Primary osteoarthritis, left shoulder: Secondary | ICD-10-CM | POA: Diagnosis not present

## 2023-01-17 DIAGNOSIS — M25812 Other specified joint disorders, left shoulder: Secondary | ICD-10-CM | POA: Diagnosis not present

## 2023-01-17 DIAGNOSIS — M7522 Bicipital tendinitis, left shoulder: Secondary | ICD-10-CM | POA: Diagnosis not present

## 2023-01-17 DIAGNOSIS — Z4889 Encounter for other specified surgical aftercare: Secondary | ICD-10-CM | POA: Diagnosis not present

## 2023-01-17 NOTE — Progress Notes (Signed)
Kimberly Mckay,   Vitamin d in normal range. I would increase by 1000 units daily to improve.  B12 better. Stay on oral b12.  Cholesterol looks good.  Thyroid looks good.  Not anemic! Stay on iron supplement.

## 2023-01-19 DIAGNOSIS — Z4889 Encounter for other specified surgical aftercare: Secondary | ICD-10-CM | POA: Diagnosis not present

## 2023-01-19 DIAGNOSIS — M7522 Bicipital tendinitis, left shoulder: Secondary | ICD-10-CM | POA: Diagnosis not present

## 2023-01-19 DIAGNOSIS — M19012 Primary osteoarthritis, left shoulder: Secondary | ICD-10-CM | POA: Diagnosis not present

## 2023-01-19 DIAGNOSIS — M25812 Other specified joint disorders, left shoulder: Secondary | ICD-10-CM | POA: Diagnosis not present

## 2023-01-31 DIAGNOSIS — M7522 Bicipital tendinitis, left shoulder: Secondary | ICD-10-CM | POA: Diagnosis not present

## 2023-01-31 DIAGNOSIS — Z4889 Encounter for other specified surgical aftercare: Secondary | ICD-10-CM | POA: Diagnosis not present

## 2023-01-31 DIAGNOSIS — M25812 Other specified joint disorders, left shoulder: Secondary | ICD-10-CM | POA: Diagnosis not present

## 2023-01-31 DIAGNOSIS — M19012 Primary osteoarthritis, left shoulder: Secondary | ICD-10-CM | POA: Diagnosis not present

## 2023-02-25 ENCOUNTER — Other Ambulatory Visit: Payer: Self-pay | Admitting: Physician Assistant

## 2023-03-01 DIAGNOSIS — M19012 Primary osteoarthritis, left shoulder: Secondary | ICD-10-CM | POA: Diagnosis not present

## 2023-03-01 DIAGNOSIS — M25812 Other specified joint disorders, left shoulder: Secondary | ICD-10-CM | POA: Diagnosis not present

## 2023-03-01 DIAGNOSIS — Z4889 Encounter for other specified surgical aftercare: Secondary | ICD-10-CM | POA: Diagnosis not present

## 2023-03-01 DIAGNOSIS — M7522 Bicipital tendinitis, left shoulder: Secondary | ICD-10-CM | POA: Diagnosis not present

## 2023-03-09 DIAGNOSIS — M25812 Other specified joint disorders, left shoulder: Secondary | ICD-10-CM | POA: Diagnosis not present

## 2023-03-09 DIAGNOSIS — M19012 Primary osteoarthritis, left shoulder: Secondary | ICD-10-CM | POA: Diagnosis not present

## 2023-03-09 DIAGNOSIS — Z4889 Encounter for other specified surgical aftercare: Secondary | ICD-10-CM | POA: Diagnosis not present

## 2023-03-09 DIAGNOSIS — M7522 Bicipital tendinitis, left shoulder: Secondary | ICD-10-CM | POA: Diagnosis not present

## 2023-03-12 ENCOUNTER — Encounter: Payer: Self-pay | Admitting: Physician Assistant

## 2023-03-21 MED ORDER — FLUCONAZOLE 150 MG PO TABS: 150.0000 mg | ORAL_TABLET | Freq: Once | ORAL | 0 refills | Status: AC

## 2023-04-06 DIAGNOSIS — Z4889 Encounter for other specified surgical aftercare: Secondary | ICD-10-CM | POA: Diagnosis not present

## 2023-04-06 DIAGNOSIS — M25812 Other specified joint disorders, left shoulder: Secondary | ICD-10-CM | POA: Diagnosis not present

## 2023-04-06 DIAGNOSIS — M19012 Primary osteoarthritis, left shoulder: Secondary | ICD-10-CM | POA: Diagnosis not present

## 2023-04-06 DIAGNOSIS — M7522 Bicipital tendinitis, left shoulder: Secondary | ICD-10-CM | POA: Diagnosis not present

## 2023-04-13 DIAGNOSIS — M19012 Primary osteoarthritis, left shoulder: Secondary | ICD-10-CM | POA: Diagnosis not present

## 2023-04-13 DIAGNOSIS — M25812 Other specified joint disorders, left shoulder: Secondary | ICD-10-CM | POA: Diagnosis not present

## 2023-04-13 DIAGNOSIS — Z4889 Encounter for other specified surgical aftercare: Secondary | ICD-10-CM | POA: Diagnosis not present

## 2023-04-13 DIAGNOSIS — M7522 Bicipital tendinitis, left shoulder: Secondary | ICD-10-CM | POA: Diagnosis not present

## 2023-04-20 DIAGNOSIS — M7522 Bicipital tendinitis, left shoulder: Secondary | ICD-10-CM | POA: Diagnosis not present

## 2023-04-20 DIAGNOSIS — M25812 Other specified joint disorders, left shoulder: Secondary | ICD-10-CM | POA: Diagnosis not present

## 2023-04-20 DIAGNOSIS — M19012 Primary osteoarthritis, left shoulder: Secondary | ICD-10-CM | POA: Diagnosis not present

## 2023-04-20 DIAGNOSIS — Z4889 Encounter for other specified surgical aftercare: Secondary | ICD-10-CM | POA: Diagnosis not present

## 2023-04-26 DIAGNOSIS — Z4789 Encounter for other orthopedic aftercare: Secondary | ICD-10-CM | POA: Diagnosis not present

## 2023-05-13 ENCOUNTER — Ambulatory Visit (INDEPENDENT_AMBULATORY_CARE_PROVIDER_SITE_OTHER): Payer: BC Managed Care – PPO | Admitting: Physician Assistant

## 2023-05-13 ENCOUNTER — Encounter: Payer: Self-pay | Admitting: Physician Assistant

## 2023-05-13 VITALS — BP 110/79 | HR 62 | Ht 67.0 in | Wt 212.8 lb

## 2023-05-13 DIAGNOSIS — R232 Flushing: Secondary | ICD-10-CM | POA: Insufficient documentation

## 2023-05-13 DIAGNOSIS — E66811 Obesity, class 1: Secondary | ICD-10-CM | POA: Diagnosis not present

## 2023-05-13 DIAGNOSIS — N898 Other specified noninflammatory disorders of vagina: Secondary | ICD-10-CM | POA: Diagnosis not present

## 2023-05-13 DIAGNOSIS — E6609 Other obesity due to excess calories: Secondary | ICD-10-CM | POA: Insufficient documentation

## 2023-05-13 DIAGNOSIS — Z6833 Body mass index (BMI) 33.0-33.9, adult: Secondary | ICD-10-CM

## 2023-05-13 NOTE — Patient Instructions (Addendum)
 Continue wegovy 2.4mg  weekly Coconut oil for vaginal dryness

## 2023-05-14 LAB — ESTRADIOL: Estradiol: 519 pg/mL

## 2023-05-14 LAB — FSH/LH
FSH: 5.4 m[IU]/mL
LH: 12.2 m[IU]/mL

## 2023-05-14 LAB — PROGESTERONE: Progesterone: 0.2 ng/mL

## 2023-05-15 ENCOUNTER — Encounter: Payer: Self-pay | Admitting: Physician Assistant

## 2023-05-15 NOTE — Progress Notes (Addendum)
   Established Patient Office Visit  Subjective   Patient ID: Kimberly Mckay, female    DOB: 1980-09-14  Age: 43 y.o. MRN: [id number]  Chief Complaint  Patient presents with   Weight Loss    Wegovy  f/u    HPI Pt is a 43 yo obese female who presents to the clinic to follow up on weight. She has lost over 60lbs on wegovy  and another 8lbs in the last 6 months. She is active and keeping a healthy diet. She is tolerating medication well. Her goal weight is under 200lbs.   Pt has had a lot of issues with hot flashes and vaginal dryness over the last 3 months. Sex has been really painful. She wonders about menopause and wants hormones checked. Had hysterectomy but left ovaries.   ROS See HPI.    Objective:     BP 110/79 (BP Location: Left Arm, Patient Position: Sitting, Cuff Size: Normal)   Pulse 62   Ht 5\' 7"  (1.702 m)   Wt 212 lb 12 oz (96.5 kg)   LMP  (LMP Unknown) Comment: Patient has been bleeding continously for months.  SpO2 97%   BMI 33.32 kg/m  BP Readings from Last 3 Encounters:  05/13/23 110/79  01/14/23 127/83  07/14/22 120/80   Wt Readings from Last 3 Encounters:  05/13/23 212 lb 12 oz (96.5 kg)  01/14/23 220 lb (99.8 kg)  07/14/22 257 lb (116.6 kg)      Physical Exam Constitutional:      Appearance: Normal appearance. She is obese.  Cardiovascular:     Rate and Rhythm: Normal rate and regular rhythm.  Pulmonary:     Effort: Pulmonary effort is normal.     Breath sounds: Normal breath sounds.  Neurological:     General: No focal deficit present.     Mental Status: She is alert and oriented to person, place, and time.  Psychiatric:        Mood and Affect: Mood normal.        The 10-year ASCVD risk score (Arnett DK, et al., 2019) is: 0.4%    Assessment & Plan:  Aaron AasAaron AasChristina "Chrissy" was seen today for weight loss.  Diagnoses and all orders for this visit:  Class 1 obesity due to excess calories without serious comorbidity with body mass  index (BMI) of 33.0 to 33.9 in adult  Hot flashes -     FSH/LH -     Progesterone  -     Estradiol   Vaginal dryness -     FSH/LH -     Progesterone  -     Estradiol    Hormones checked today to evaluate for hot flashes and vaginal dryness. Discussed Veozah vs HRT.   Pt has lost 60lbs overall and 8lbs in the last month on wegovy .  Risk vs benefits reviewed with patient and she has no concerns Continue wegovy  2.4mg  weekly.  Encouraged patient to continue to exercise at least 150 minutes a week and keep to a 1200 calorie diet with 80g of protein.  Follow up in 6 months to recheck weight, side effects, and compliance.    Return in about 6 months (around 11/13/2023).    Mavin Dyke, PA-C

## 2023-05-16 ENCOUNTER — Encounter: Payer: Self-pay | Admitting: Physician Assistant

## 2023-05-16 NOTE — Progress Notes (Signed)
 Your estrogen levels are elevated if anything but not low. You are no where close to menopause. Your progesterone levels are on the low side. We could supplement with some progesterone to see if helped your vaginal symptoms.   I would like to recheck estradiol and progesterone if we decided to supplement in 3 months. Thoughts?

## 2023-05-18 MED ORDER — PROGESTERONE MICRONIZED 100 MG PO CAPS: 100.0000 mg | ORAL_CAPSULE | Freq: Every day | ORAL | 5 refills | Status: DC

## 2023-05-31 ENCOUNTER — Ambulatory Visit (INDEPENDENT_AMBULATORY_CARE_PROVIDER_SITE_OTHER): Admitting: Physician Assistant

## 2023-05-31 ENCOUNTER — Encounter: Payer: Self-pay | Admitting: Physician Assistant

## 2023-05-31 VITALS — BP 125/81 | HR 74 | Ht 67.0 in | Wt 214.0 lb

## 2023-05-31 DIAGNOSIS — J309 Allergic rhinitis, unspecified: Secondary | ICD-10-CM

## 2023-05-31 DIAGNOSIS — H1013 Acute atopic conjunctivitis, bilateral: Secondary | ICD-10-CM | POA: Diagnosis not present

## 2023-05-31 DIAGNOSIS — J302 Other seasonal allergic rhinitis: Secondary | ICD-10-CM | POA: Diagnosis not present

## 2023-05-31 MED ORDER — METHYLPREDNISOLONE 4 MG PO TBPK: ORAL_TABLET | ORAL | 0 refills | Status: DC

## 2023-05-31 MED ORDER — AZELASTINE HCL 0.05 % OP SOLN: 1.0000 [drp] | Freq: Two times a day (BID) | OPHTHALMIC | 11 refills | Status: DC

## 2023-05-31 NOTE — Progress Notes (Unsigned)
 Acute Office Visit  Subjective:     Patient ID: Kimberly Mckay, female    DOB: [birth year], 43 y.o.   MRN: [id number]  Chief Complaint  Patient presents with   Medical Management of Chronic Issues    Allergies     HPI Patient is in today for itchy watery red eyes that have been matted shut in the morning with the increase in pollen for the last 2 weeks but worsening. She has a cough from sinus drainage. She is blowing out clear mucus. She feels very congested. She is using zyrtec and flonase twice a day. She denies any fever, chills, body aches. It rained yesterday and feels a little better.    ROS See HPI     Objective:    BP 125/81   Pulse 74   Ht 5\' 7"  (1.702 m)   Wt 214 lb (97.1 kg)   LMP  (LMP Unknown) Comment: Patient has been bleeding continously for months.  SpO2 99%   BMI 33.52 kg/m  BP Readings from Last 3 Encounters:  05/31/23 125/81  05/13/23 110/79  01/14/23 127/83   Wt Readings from Last 3 Encounters:  05/31/23 214 lb (97.1 kg)  05/13/23 212 lb 12 oz (96.5 kg)  01/14/23 220 lb (99.8 kg)      Physical Exam Constitutional:      Appearance: Normal appearance. She is obese.  HENT:     Head: Normocephalic.     Right Ear: Tympanic membrane, ear canal and external ear normal. There is no impacted cerumen.     Left Ear: Tympanic membrane, ear canal and external ear normal. There is no impacted cerumen.     Nose: Congestion and rhinorrhea present.     Mouth/Throat:     Mouth: Mucous membranes are moist.     Pharynx: No oropharyngeal exudate or posterior oropharyngeal erythema.  Eyes:     General:        Right eye: Discharge present.        Left eye: Discharge present.    Extraocular Movements: Extraocular movements intact.     Pupils: Pupils are equal, round, and reactive to light.     Comments: Injected conjunctiva with watery discharge and slightly swollen appearance to upper and lower eyelids  Cardiovascular:     Rate and Rhythm: Normal  rate and regular rhythm.  Pulmonary:     Effort: Pulmonary effort is normal.     Breath sounds: Normal breath sounds.  Musculoskeletal:     Cervical back: No tenderness.     Right lower leg: No edema.     Left lower leg: No edema.  Lymphadenopathy:     Cervical: Cervical adenopathy present.  Neurological:     General: No focal deficit present.     Mental Status: She is alert and oriented to person, place, and time.  Psychiatric:        Mood and Affect: Mood normal.          Assessment & Plan:  Marland KitchenMarland KitchenChristina "Kimberly Mckay" was seen today for medical management of chronic issues.  Diagnoses and all orders for this visit:  Allergic conjunctivitis and rhinitis, bilateral -     azelastine (OPTIVAR) 0.05 % ophthalmic solution; Place 1 drop into both eyes 2 (two) times daily. -     methylPREDNISolone (MEDROL DOSEPAK) 4 MG TBPK tablet; Take as directed by package insert.  Seasonal allergies -     azelastine (OPTIVAR) 0.05 % ophthalmic solution; Place 1 drop into both eyes  2 (two) times daily. -     methylPREDNISolone (MEDROL DOSEPAK) 4 MG TBPK tablet; Take as directed by package insert.  Continue zyrtec and flonase Start medrol dose pack and optivar eye drops Cool compresses over eyes Avoid being outside on high pollen days  Kimberly Gaw, PA-C

## 2023-05-31 NOTE — Patient Instructions (Addendum)
 Continue zyrtec and flonase Adding optivar for eyes  Medrol dose pack  Allergic Conjunctivitis, Adult  Allergic conjunctivitis is inflammation of the clear membrane that covers the white part of your eye and the inner surface of your eyelid. This area is called the conjunctiva. This condition can make your eye red or pink. It can also make your eye feel itchy. This condition is not contagious. This means that it cannot be spread from one person to another person. What are the causes? This condition is caused by allergens. These are things that can cause an allergic reaction in some people. Common allergens include: Outdoor allergens, such as: Pollen, including pollen from grass and weeds. Mold. Car fumes. Indoor allergens, such as: Dust. Smoke. Mold. Proteins in a pet's pee (urine), saliva, or dander. Proteins that build up in contact lenses. What increases the risk? You are more likely to develop this condition if you have a family history of these things: Allergies. Conditions that you get because of allergens, such as asthma or inflammation of the skin (eczema). What are the signs or symptoms? Symptoms of this condition include eyes that are: Itchy. Red. Watery. Puffy. Your eyes may also: Sting or burn. Have clear fluid draining from them. Have thick mucus coming from them. This happens in severe cases. How is this treated? Treatment for this condition may include: Using cold, wet cloths (cold compresses) to soothe itching and swelling. Washing the face, hair, and clothing to remove allergens. Using eye drops. These may include: Eye drops that block allergies. Eye drops that reduce swelling and irritation. Steroid eye drops if other treatments have not worked. Oral antihistamine medicines. These medicines lessen your allergies. You may need these if eye drops do not help or are difficult to use. Air purifier at home and work. Wrap around sunglasses. This may help to  block allergens from reaching the eye. Not wearing contact lenses, if the doctor has found that contact lenses caused your symptoms. Use daily wear disposal contact lenses instead. Follow these instructions at home: Eye care Place a cool, clean washcloth on your eye for 10-20 minutes. Do this 3-4 times a day. Do not touch or rub your eyes. Do not wear contact lenses until the inflammation is gone. Wear glasses instead. Do not wear eye makeup until the inflammation is gone. General instructions Try not to be around things that you are allergic to. Take or apply over-the-counter and prescription medicines only as told by your doctor. These include any eye drops. Drink enough fluid to keep your pee pale yellow. Keep all follow-up visits. Contact a doctor if: Your symptoms get worse. Your symptoms do not get better with treatment. You have mild eye pain. You are sensitive to light. You have spots or blisters on your eyes. You have pus coming from your eye. You have a fever. Get help right away if: You have redness, swelling, or other symptoms in only one eye. You cannot see well. You have other vision changes. You have very bad eye pain. Summary Allergic conjunctivitis is caused by allergens. It can make your eye red or pink, and it can make your eye feel itchy. This condition cannot be spread from one person to another person. Avoid things that you are allergic to. Take or apply over-the-counter and prescription medicines only as told by your doctor. These include any eye drops. Contact your doctor if your symptoms get worse or they do not get better with treatment. This information is not intended to  replace advice given to you by your health care provider. Make sure you discuss any questions you have with your health care provider. Document Revised: 04/17/2021 Document Reviewed: 04/17/2021 Elsevier Patient Education  2024 [employer].

## 2023-06-01 ENCOUNTER — Encounter: Payer: Self-pay | Admitting: Physician Assistant

## 2023-06-03 ENCOUNTER — Encounter: Payer: Self-pay | Admitting: Physician Assistant

## 2023-06-03 MED ORDER — AMOXICILLIN-POT CLAVULANATE 875-125 MG PO TABS: 1.0000 | ORAL_TABLET | Freq: Two times a day (BID) | ORAL | 0 refills | Status: DC

## 2023-06-06 ENCOUNTER — Ambulatory Visit (INDEPENDENT_AMBULATORY_CARE_PROVIDER_SITE_OTHER): Admitting: Plastic Surgery

## 2023-06-06 ENCOUNTER — Encounter: Payer: Self-pay | Admitting: Plastic Surgery

## 2023-06-06 VITALS — BP 107/74 | HR 68 | Ht 67.0 in | Wt 209.0 lb

## 2023-06-06 DIAGNOSIS — G8929 Other chronic pain: Secondary | ICD-10-CM

## 2023-06-06 DIAGNOSIS — M793 Panniculitis, unspecified: Secondary | ICD-10-CM | POA: Insufficient documentation

## 2023-06-06 DIAGNOSIS — M542 Cervicalgia: Secondary | ICD-10-CM | POA: Diagnosis not present

## 2023-06-06 DIAGNOSIS — E88819 Insulin resistance, unspecified: Secondary | ICD-10-CM

## 2023-06-06 DIAGNOSIS — M546 Pain in thoracic spine: Secondary | ICD-10-CM

## 2023-06-06 DIAGNOSIS — F39 Unspecified mood [affective] disorder: Secondary | ICD-10-CM

## 2023-06-06 NOTE — Progress Notes (Signed)
 Patient ID: Kimberly Mckay, female    DOB: 05-Oct-1980, 43 y.o.   MRN: [id number]   Chief Complaint  Patient presents with   Consult    The patient is a 43 year old female here for evaluation of her abdomen.  She complains of neck and back pain that has been persistent.  The biggest issue is dryness and skin irritation at her skin folds.  She has been very diligent about weight loss.  She is 5 feet 7 inches tall and 209 pounds.  She has lost 100 pounds in the last year with help from her primary physician.  She has not been smoking for over 5 years.  She has had C-section in the past and healed well after.  She has tried different techniques for alleviating the pain and the rashes.  They are temporary and none are long-term.  She is planning on losing another 20 to 30 pounds.    Review of Systems  Constitutional: Negative.   HENT: Negative.    Eyes: Negative.   Respiratory: Negative.    Cardiovascular: Negative.   Gastrointestinal: Negative.   Endocrine: Negative.   Genitourinary: Negative.   Musculoskeletal:  Positive for back pain and neck pain.  Skin:  Positive for rash.  Hematological: Negative.     Past Medical History:  Diagnosis Date   Anemia 2024   due to heavy periods   Anxiety    takes xanax prn, follows w/ PCP   Asthma    Hasn't had an asthma exacerbation in many years per pt on 03/24/22.   Back pain    Complication of anesthesia    pt aspirated following upper GI   COVID-19 04/2019   Depression    Eczema    Fibromyalgia    GERD (gastroesophageal reflux disease)    takes Prevacid   PONV (postoperative nausea and vomiting)    Swelling of both lower extremities    Vitamin D deficiency    taking Vitamin D supplements   Wears contact lenses    Wears glasses     Past Surgical History:  Procedure Laterality Date   BREAST CYST EXCISION Bilateral 01/29/2021   Procedure: Excision of accessory axillary tissue;  Surgeon: Peggye Form, DO;   Location: [facility];  Service: Plastics;  Laterality: Bilateral;  1.5 hours   BREAST REDUCTION SURGERY Bilateral 09/03/2020   Procedure: BILATERAL BREAST REDUCTION WITH LIPOSUCTION;  Surgeon: Peggye Form, DO;  Location: [facility];  Service: Plastics;  Laterality: Bilateral;  4 hours total   CYSTOSCOPY N/A 04/07/2022   Procedure: CYSTOSCOPY;  Surgeon: Lennart Pall, MD;  Location: [facility];  Service: Gynecology;  Laterality: N/A;   TOTAL LAPAROSCOPIC HYSTERECTOMY WITH SALPINGECTOMY Bilateral 04/07/2022   Procedure: TOTAL LAPAROSCOPIC HYSTERECTOMY WITH SALPINGECTOMY;  Surgeon: Lennart Pall, MD;  Location: [facility];  Service: Gynecology;  Laterality: Bilateral;   UPPER GI ENDOSCOPY     pt states she aspirated with this procedure   WISDOM TOOTH EXTRACTION        Current Outpatient Medications:    acetaminophen (TYLENOL) 500 MG tablet, Take 2 tablets (1,000 mg total) by mouth every 8 (eight) hours., Disp: 60 tablet, Rfl: 0   albuterol (VENTOLIN HFA) 108 (90 Base) MCG/ACT inhaler, Inhale into the lungs., Disp: , Rfl:    ALPRAZolam (XANAX) 0.5 MG tablet, Take 0.5 mg by mouth as needed for anxiety., Disp: , Rfl:    amoxicillin-clavulanate (AUGMENTIN) 875-125 MG  tablet, Take 1 tablet by mouth 2 (two) times daily., Disp: 20 tablet, Rfl: 0   azelastine (OPTIVAR) 0.05 % ophthalmic solution, Place 1 drop into both eyes 2 (two) times daily., Disp: 6 mL, Rfl: 11   ferrous sulfate 325 (65 FE) MG EC tablet, Take 1 tablet (325 mg total) by mouth daily with breakfast., Disp: 90 tablet, Rfl: 1   ibuprofen (ADVIL) 800 MG tablet, Take 1 tablet (800 mg total) by mouth every 8 (eight) hours., Disp: 60 tablet, Rfl: 0   lansoprazole (PREVACID) 15 MG capsule, Take 15 mg by mouth daily., Disp: , Rfl:    methylPREDNISolone (MEDROL DOSEPAK) 4 MG TBPK tablet, Take as directed by package insert., Disp: 21 tablet, Rfl: 0   ondansetron  (ZOFRAN-ODT) 4 MG disintegrating tablet, Dissolve 1 tablet (4 mg total) by mouth every 6 (six) hours as needed for nausea., Disp: 20 tablet, Rfl: 0   ondansetron (ZOFRAN-ODT) 8 MG disintegrating tablet, Take 1 tablet (8 mg total) by mouth every 8 (eight) hours as needed for nausea., Disp: 20 tablet, Rfl: 3   progesterone (PROMETRIUM) 100 MG capsule, Take 1 capsule (100 mg total) by mouth daily., Disp: 30 capsule, Rfl: 5   senna-docusate (SENOKOT-S) 8.6-50 MG tablet, Take 1 tablet by mouth at bedtime as needed for mild constipation., Disp: 30 tablet, Rfl: 0   Vitamin D, Ergocalciferol, (DRISDOL) 1.25 MG (50000 UNIT) CAPS capsule, Take 1 capsule (50,000 Units total) by mouth every 7 (seven) days., Disp: 12 capsule, Rfl: 1   WEGOVY 2.4 MG/0.75ML SOAJ, INJECT ONE pen into the skin every week, Disp: 9 mL, Rfl: 1   Objective:   Vitals:   06/06/23 0904  BP: 107/74  Pulse: 68  SpO2: 98%    Physical Exam Vitals and nursing note reviewed.  Constitutional:      Appearance: Normal appearance.  HENT:     Head: Normocephalic and atraumatic.  Cardiovascular:     Rate and Rhythm: Normal rate.     Pulses: Normal pulses.  Pulmonary:     Effort: Pulmonary effort is normal.  Abdominal:     General: There is no distension.     Palpations: Abdomen is soft.     Tenderness: There is no abdominal tenderness.  Musculoskeletal:        General: No swelling, tenderness or deformity.  Skin:    General: Skin is warm.     Capillary Refill: Capillary refill takes less than 2 seconds.  Neurological:     Mental Status: She is alert and oriented to person, place, and time.  Psychiatric:        Mood and Affect: Mood normal.        Behavior: Behavior normal.        Thought Content: Thought content normal.        Judgment: Judgment normal.    Assessment & Plan:  Insulin resistance  Mood disorder (HCC)- emotional eating  Chronic bilateral thoracic back pain  Neck pain  Panniculitis  I believe the  patient will be a very good candidate for a panniculectomy.  I encouraged her to continue on her journey and when she gets closer to come back and see [place].  She will have a better long-term result if she is at her desired or closer to her desired weight prior to doing the surgery.  She may want to add the upper abdominal part onto that.  We can get her a quote at her next visit.  Pictures were obtained of the  patient and placed in the chart with the patient's or guardian's permission.   Lindaann Requena Presley Summerlin, DO

## 2023-06-15 ENCOUNTER — Telehealth: Payer: Self-pay

## 2023-06-15 ENCOUNTER — Other Ambulatory Visit (HOSPITAL_COMMUNITY): Payer: Self-pay

## 2023-06-15 NOTE — Telephone Encounter (Signed)
 Pharmacy Patient Advocate Encounter   Received notification from Patient Pharmacy that prior authorization for Wegovy  is required/requested.   Insurance verification completed.   The patient is insured through [facility] .   Per test claim: PA required; PA submitted to above mentioned insurance via CoverMyMeds Key/confirmation #/EOC [employer] Status is pending

## 2023-06-16 ENCOUNTER — Other Ambulatory Visit (HOSPITAL_COMMUNITY): Payer: Self-pay

## 2023-06-21 ENCOUNTER — Institutional Professional Consult (permissible substitution): Payer: BC Managed Care – PPO | Admitting: Plastic Surgery

## 2023-06-21 NOTE — Telephone Encounter (Signed)
 Pharmacy Patient Advocate Encounter  Received notification from [facility] that Prior Authorization for Wegovy  has been DENIED.  Full denial letter will be uploaded to the media tab. See denial reason below.   PA #/Case ID/Reference #: [id number] [id number]

## 2023-06-22 ENCOUNTER — Telehealth: Payer: Self-pay | Admitting: Pharmacist

## 2023-06-22 ENCOUNTER — Other Ambulatory Visit (HOSPITAL_COMMUNITY): Payer: Self-pay

## 2023-06-22 NOTE — Telephone Encounter (Signed)
 Based on the insurance denial, "We denied your request because we did not see what we need to approve the drug you asked for, (Wegovy ). We may be able to approve this drug in a certain situation (if the person that provides your medical care or treatment has talked to you about the risks [adverse drug events], benefits, what you should expect, and the need to continue watching your health and keep up with changes to your lifestyle while taking this drug). We do not see that this applies to you."  This language is something new we are starting to see with [employer] renewals for Wegovy . They are now requiring that the medical record includes documentation confirming that the patient is continuing lifestyle modifications such as diet and exercise, that the provider has reviewed the risks and benefits of treatment, and that the patient understands the expectations and need for continued monitoring. If this information can be added to the most recent chart note, we will be able to complete and submit the appeal.  Thank you, Dene Fines, PharmD Clinical Pharmacist  [place]  Direct Dial: [phone]

## 2023-06-23 ENCOUNTER — Other Ambulatory Visit (HOSPITAL_COMMUNITY): Payer: Self-pay

## 2023-06-24 ENCOUNTER — Other Ambulatory Visit (HOSPITAL_COMMUNITY): Payer: Self-pay

## 2023-06-24 ENCOUNTER — Telehealth: Payer: Self-pay

## 2023-06-24 NOTE — Telephone Encounter (Signed)
 Pharmacy Patient Advocate Encounter   Received notification from Pt Calls Messages that prior authorization for Wegovy  2.4 is required/requested.   Please see appeals encounter 06/22/23

## 2023-06-27 ENCOUNTER — Telehealth: Payer: Self-pay

## 2023-06-27 NOTE — Telephone Encounter (Signed)
 Patient was sent a Mychart msg with an explanation of the denial for Wegovy .

## 2023-06-27 NOTE — Telephone Encounter (Signed)
 Copied from CRM [id number]. Topic: Referral - Prior Authorization Question >> Jun 27, 2023  1:20 PM Karole Pacer C wrote: Reason for CRM: Patient would like a call back regarding a prior auth she needs for WEGOVY  2.4 MG/0.75ML SOAJ. Patient states she has not got a reply back on mychart and she would like to speak with a nurse instead [phone]

## 2023-06-28 ENCOUNTER — Telehealth: Payer: Self-pay | Admitting: Pharmacist

## 2023-06-28 NOTE — Telephone Encounter (Signed)
 Appeal has been submitted for Wegovy . Will advise when response is received, please be advised that most companies may take 30 days to make a decision. Appeal letter and supporting documentation have been faxed to [phone] on 06/28/2023 @1 :51 pm.  Thank you, Dene Fines, PharmD Clinical Pharmacist  [place]  Direct Dial: [phone]

## 2023-06-28 NOTE — Telephone Encounter (Signed)
 Duplicate msg. This request is being handled by the appeals. No further action is required. Please review other telephone encounters for additional information.

## 2023-06-28 NOTE — Telephone Encounter (Signed)
 An appeal has been submitted and documented in separate encounter. Thank you

## 2023-07-04 ENCOUNTER — Other Ambulatory Visit (HOSPITAL_COMMUNITY): Payer: Self-pay

## 2023-07-13 ENCOUNTER — Other Ambulatory Visit (HOSPITAL_COMMUNITY): Payer: Self-pay

## 2023-07-14 ENCOUNTER — Other Ambulatory Visit (HOSPITAL_COMMUNITY): Payer: Self-pay

## 2023-07-19 ENCOUNTER — Other Ambulatory Visit (HOSPITAL_COMMUNITY): Payer: Self-pay

## 2023-07-28 ENCOUNTER — Other Ambulatory Visit (HOSPITAL_COMMUNITY): Payer: Self-pay

## 2023-08-02 NOTE — Telephone Encounter (Signed)
 Patient is requesting an update on an appeal for Wegovy  sent on 06/28/23. Thanks in advance.

## 2023-08-03 ENCOUNTER — Telehealth: Payer: Self-pay | Admitting: Pharmacist

## 2023-08-03 NOTE — Telephone Encounter (Signed)
 Appeal has been submitted for Wegovy . Will advise when response is received or follow up in 1 week. Please be advised that most companies may take 30 days to make a decision. Appeal letter and supporting documentation have been faxed to [phone] on 08/03/2023 @10 :06 am.  Thank you, Dene Fines, PharmD Clinical Pharmacist  [place]  Direct Dial: [phone]

## 2023-08-03 NOTE — Telephone Encounter (Signed)
 Appeal voided because patient filed an appeal.  Second appeal completed and documented in another telephone encounter.

## 2023-08-11 NOTE — Telephone Encounter (Signed)
 Per the patient, [place] out of [place]. She has agreed to schedule a Nurse visit on 08/19/23 at 930 am for a weight check. Patient informed that appeals process is pended until there is documentation of her current weight. Verbalized understanding.

## 2023-08-11 NOTE — Telephone Encounter (Signed)
 Spoke with [employer], they are requesting a current weight.  I have let the patient know as well!  Are you able to get her in and document her weight?  Thanks, Amalia Badder

## 2023-08-19 ENCOUNTER — Ambulatory Visit

## 2023-08-23 ENCOUNTER — Other Ambulatory Visit: Payer: Self-pay | Admitting: Physician Assistant

## 2023-08-23 DIAGNOSIS — E66813 Obesity, class 3: Secondary | ICD-10-CM

## 2023-08-31 ENCOUNTER — Ambulatory Visit (INDEPENDENT_AMBULATORY_CARE_PROVIDER_SITE_OTHER): Admitting: Physician Assistant

## 2023-08-31 VITALS — BP 131/80 | HR 81 | Ht 67.0 in | Wt 207.8 lb

## 2023-08-31 DIAGNOSIS — E66811 Obesity, class 1: Secondary | ICD-10-CM | POA: Diagnosis not present

## 2023-08-31 DIAGNOSIS — Z6832 Body mass index (BMI) 32.0-32.9, adult: Secondary | ICD-10-CM

## 2023-08-31 DIAGNOSIS — E6609 Other obesity due to excess calories: Secondary | ICD-10-CM

## 2023-08-31 MED ORDER — ZEPBOUND 10 MG/0.5ML ~~LOC~~ SOAJ: 10.0000 mg | SUBCUTANEOUS | 1 refills | Status: DC

## 2023-08-31 MED ORDER — ZEPBOUND 7.5 MG/0.5ML ~~LOC~~ SOAJ: 7.5000 mg | SUBCUTANEOUS | 0 refills | Status: DC

## 2023-08-31 NOTE — Patient Instructions (Signed)
 Start 7.5mg  weekly for one month then increase to 10mg  weekly.   Tirzepatide  Injection (Weight Management) What is this medication? TIRZEPATIDE  (tir ZEP a tide) promotes weight loss. It may also be used to maintain weight loss.  It works by decreasing appetite. It can be used to treat sleep apnea. Changes to diet and exercise are often combined with this medication. This medicine may be used for other purposes; ask your health care provider or pharmacist if you have questions. COMMON BRAND NAME(S): Zepbound  What should I tell my care team before I take this medication? They need to know if you have any of these conditions: Diabetes Eye disease caused by diabetes Gallbladder disease Have or have had depression Have or have had pancreatitis Having surgery Kidney disease Personal or family history of MEN 2, a condition that causes endocrine gland tumors Personal or family history of thyroid  cancer Stomach or intestine problems, such as problems digesting food Suicidal thoughts, plans, or attempt An unusual or allergic reaction to tirzepatide , other medications, foods, dyes, or preservatives Pregnant or trying to get pregnant Breastfeeding How should I use this medication? This medication is injected under the skin. You will be taught how to prepare and give it. Take it as directed on the prescription label. Keep taking it unless your care team tells you to stop. It is important that you put your used needles and syringes in a special sharps container. Do not put them in a trash can. If you do not have a sharps container, call your pharmacist or care team to get one. A special MedGuide will be given to you by the pharmacist with each prescription and refill. Be sure to read this information carefully each time. This medication comes with INSTRUCTIONS FOR USE. Ask your pharmacist for directions on how to use this medication. Read the information carefully. Talk to your pharmacist or care team  if you have questions. Talk to your care team about the use of this medication in children. Special care may be needed. Overdosage: If you think you have taken too much of this medicine contact a poison control center or emergency room at once. NOTE: This medicine is only for you. Do not share this medicine with others. What if I miss a dose? If you miss a dose, take it as soon as you can unless it is more than 4 days (96 hours) late. If it is more than 4 days late, skip the missed dose. Take the next dose at the normal time. Do not take 2 doses within 3 days (72 hours) of each other. What may interact with this medication? Certain medications for diabetes, such as insulin , glyburide, glipizide This medication may affect how other medications work. Talk with your care team about all of the medications you take. They may suggest changes to your treatment plan to lower the risk of side effects and to make sure your medications work as intended. This list may not describe all possible interactions. Give your health care provider a list of all the medicines, herbs, non-prescription drugs, or dietary supplements you use. Also tell them if you smoke, drink alcohol, or use illegal drugs. Some items may interact with your medicine. What should I watch for while using this medication? Visit your care team for regular checks on your progress. Tell your care team if your condition does not start to get better or if it gets worse. Tell your care team if you are taking medication to treat diabetes, such as insulin   or glipizide. This may increase your risk of low blood sugar. Know the symptoms of low blood sugar and how to treat it. Talk to your care team about your risk of cancer. You may be more at risk for certain types of cancer if you take this medication. Talk to your care team right away if you have a lump or swelling in your neck, hoarseness that does not go away, trouble swallowing, shortness of breath, or  trouble breathing. Make sure you stay hydrated while taking this medication. Drink water often. Eat fruits and veggies that have a high water content. Drink more water when it is hot or you are active. Talk to your care team right away if you have fever, infection, vomiting, diarrhea, or if you sweat a lot while taking this medication. The loss of too much body fluid may make it dangerous for you to take this medication. If you are going to need surgery or a procedure, tell your care team that you are taking this medication. Estrogen and progestin hormones that you take by mouth may not work as well while you are taking this medication. Switch to a non-oral contraceptive or add a barrier contraceptive for 4 weeks after starting this medication and after each dose increase. Talk to your care team about contraceptive options. They can help you find the option that works for you. Do not take this medication without first talking to your care team if you may be or could become pregnant. Your care team can help you find the option that works for you. Weight loss is not recommended during pregnancy. Talk to your care team if you are breastfeeding. When recommended, this medication may be taken. Its use during breastfeeding has not been well studied. Your care team may suggest other options. What side effects may I notice from receiving this medication? Side effects that you should report to your care team as soon as possible: Allergic reactions--skin rash, itching, hives, swelling of the face, lips, tongue, or throat Change in vision Dehydration--increased thirst, dry mouth, feeling faint or lightheaded, headache, dark yellow or brown urine Fast or irregular heartbeat Gallbladder problems--severe stomach pain, nausea, vomiting, fever Kidney injury--decrease in the amount of urine, swelling of the ankles, hands, or feet Pancreatitis--severe stomach pain that spreads to your back or gets worse after eating or  when touched, fever, nausea, vomiting Thoughts of suicide or self-harm, worsening mood, feelings of depression Thyroid  cancer--new mass or lump in the neck, pain or trouble swallowing, trouble breathing, hoarseness Side effects that usually do not require medical attention (report these to your care team if they continue or are bothersome): Constipation Diarrhea Loss of appetite Nausea Upset stomach This list may not describe all possible side effects. Call your doctor for medical advice about side effects. You may report side effects to FDA at 1-800-FDA-1088. Where should I keep my medication? Keep out of the reach of children and pets. Store in a refrigerator or at room temperature up to 30 degrees C (86 degrees F). Keep it in the original container. Protect from light. Refrigeration (preferred): Store in the refrigerator. Do not freeze. Get rid of any unused medication after the expiration date. Room temperature: This medication may be stored at room temperature for up to 21 days. If it is stored at room temperature, get rid of any unused medication after 21 days or after it expires, whichever is first. To get rid of medications that are no longer needed or have expired: Take the  medication to a medication take-back program. Check with your pharmacy or law enforcement to find a location. If you cannot return the medication, ask your pharmacist or care team how to get rid of this medication safely. NOTE: This sheet is a summary. It may not cover all possible information. If you have questions about this medicine, talk to your doctor, pharmacist, or health care provider.  2025 Elsevier/Gold Standard (2023-03-08 00:00:00)

## 2023-08-31 NOTE — Progress Notes (Signed)
   Established Patient Office Visit  Subjective   Patient ID: Kimberly Mckay, female    DOB: 1980/12/22  Age: 43 y.o. MRN: [id number]  Chief Complaint  Patient presents with   Medical Management of Chronic Issues    HPI Pt is a 43 yo obese female who presents to the clinic to follow up on weight loss. She has been on wegovy  for 1 year and 6 months and lost 100lbs. She tolerates medication well but feels like she has plateau in weight loss. She would like to switch to zepbound . She is exercising daily. She takes 4 walks a day. She is counting her calories with my fitness pal and logging 1600 a day. She wants to be under 200 with BMI of under 27. She is tolerating medication well with no nausea or constipation that is not treatable.     Review of Systems  All other systems reviewed and are negative.     Objective:     BP 131/80   Pulse 81   Ht 5' 7 (1.702 m)   Wt 207 lb 12.8 oz (94.3 kg)   LMP  (LMP Unknown) Comment: Patient has been bleeding continously for months.  SpO2 99%   BMI 32.55 kg/m  BP Readings from Last 3 Encounters:  08/31/23 131/80  06/06/23 107/74  05/31/23 125/81   Wt Readings from Last 3 Encounters:  08/31/23 207 lb 12.8 oz (94.3 kg)  06/06/23 209 lb (94.8 kg)  05/31/23 214 lb (97.1 kg)      Physical Exam Constitutional:      Appearance: Normal appearance. She is obese.  HENT:     Head: Normocephalic.  Cardiovascular:     Rate and Rhythm: Normal rate and regular rhythm.  Pulmonary:     Effort: Pulmonary effort is normal.     Breath sounds: Normal breath sounds.  Neurological:     General: No focal deficit present.     Mental Status: She is alert and oriented to person, place, and time.  Psychiatric:        Mood and Affect: Mood normal.       The 10-year ASCVD risk score (Arnett DK, et al., 2019) is: 0.6%    Assessment & Plan:  SABRASABRAClarise Mckay was seen today for medical management of chronic issues.  Diagnoses and all  orders for this visit:  Class 1 obesity due to excess calories without serious comorbidity with body mass index (BMI) of 32.0 to 32.9 in adult -     tirzepatide  (ZEPBOUND ) 7.5 MG/0.5ML Pen; Inject 7.5 mg into the skin once a week. -     tirzepatide  (ZEPBOUND ) 10 MG/0.5ML Pen; Inject 10 mg into the skin once a week.   Pt has lost 100lbs on wegovy  over past 1 year and 6 months but is not at goal weight and would like to switch to zepbound  Start at zepbound  7.5mg  weekly for one month and then transition to 10mg  weekly Continue with daily exercise(walking) Goal calories 1400 with 80g of protein Current goal to get under 200lbs  Return in about 3 months (around 12/01/2023) for weight follow up .    Kimberly Gorby, PA-C

## 2023-09-01 ENCOUNTER — Encounter: Payer: Self-pay | Admitting: Physician Assistant

## 2023-09-02 ENCOUNTER — Telehealth: Payer: Self-pay

## 2023-09-02 ENCOUNTER — Other Ambulatory Visit (HOSPITAL_COMMUNITY): Payer: Self-pay

## 2023-09-02 ENCOUNTER — Encounter: Payer: Self-pay | Admitting: Physician Assistant

## 2023-09-02 NOTE — Telephone Encounter (Signed)
 Pharmacy Patient Advocate Encounter   Received notification from Patient Advice Request messages that prior authorization for Zepbound  7.5mg /0.47ml is required/requested.   Insurance verification completed.   The patient is insured through [employer] .   Per test claim: PA required; PA submitted to above mentioned insurance via CoverMyMeds Key/confirmation #/[id number] Status is pending

## 2023-09-05 ENCOUNTER — Other Ambulatory Visit (HOSPITAL_COMMUNITY): Payer: Self-pay

## 2023-09-06 ENCOUNTER — Ambulatory Visit: Admitting: Plastic Surgery

## 2023-09-07 NOTE — Telephone Encounter (Signed)
 Place a call to the insurance to check the status of the prior authorization.   Per representative Merlynn, the status is pending and review should be determined by 09/08/23  Phone# [phone]

## 2023-09-08 ENCOUNTER — Other Ambulatory Visit (HOSPITAL_COMMUNITY): Payer: Self-pay

## 2023-09-12 ENCOUNTER — Encounter: Payer: Self-pay | Admitting: Physician Assistant

## 2023-09-12 ENCOUNTER — Other Ambulatory Visit (HOSPITAL_COMMUNITY): Payer: Self-pay

## 2023-09-12 NOTE — Telephone Encounter (Signed)
 Pharmacy Patient Advocate Encounter  Received notification from [facility] that Prior Authorization for Zepbound  7.5mg /0.44ml has been APPROVED from 09/12/23 to 04/23/24   PA #/Case ID/Reference #: [id number]  Approval letter indexed to media tab

## 2023-10-25 ENCOUNTER — Other Ambulatory Visit: Payer: Self-pay | Admitting: Physician Assistant

## 2023-10-25 DIAGNOSIS — Z0000 Encounter for general adult medical examination without abnormal findings: Secondary | ICD-10-CM

## 2023-11-10 ENCOUNTER — Ambulatory Visit

## 2023-11-15 ENCOUNTER — Ambulatory Visit: Admitting: Physician Assistant

## 2023-11-22 NOTE — Progress Notes (Signed)
 Doriann Zuch                                          MRN: [id number]   11/22/2023   The [facility] Quality Team Specialist reviewed this patient medical record for the purposes of chart review for care gap closure. The following were reviewed: chart review for care gap closure-kidney health evaluation for diabetes:eGFR  and uACR.    [facility] Quality Team

## 2023-12-02 ENCOUNTER — Encounter: Payer: Self-pay | Admitting: Physician Assistant

## 2023-12-02 ENCOUNTER — Ambulatory Visit (INDEPENDENT_AMBULATORY_CARE_PROVIDER_SITE_OTHER): Admitting: Physician Assistant

## 2023-12-02 VITALS — BP 108/52 | HR 91 | Ht 67.0 in | Wt 198.0 lb

## 2023-12-02 DIAGNOSIS — E88819 Insulin resistance, unspecified: Secondary | ICD-10-CM

## 2023-12-02 DIAGNOSIS — E66811 Obesity, class 1: Secondary | ICD-10-CM

## 2023-12-02 DIAGNOSIS — Z9189 Other specified personal risk factors, not elsewhere classified: Secondary | ICD-10-CM | POA: Diagnosis not present

## 2023-12-02 DIAGNOSIS — E6609 Other obesity due to excess calories: Secondary | ICD-10-CM

## 2023-12-02 DIAGNOSIS — Z7689 Persons encountering health services in other specified circumstances: Secondary | ICD-10-CM

## 2023-12-02 DIAGNOSIS — Z6831 Body mass index (BMI) 31.0-31.9, adult: Secondary | ICD-10-CM

## 2023-12-02 MED ORDER — ZEPBOUND 12.5 MG/0.5ML ~~LOC~~ SOAJ: 12.5000 mg | SUBCUTANEOUS | 5 refills | Status: AC

## 2023-12-02 NOTE — Progress Notes (Signed)
 Established Patient Office Visit  Subjective   Patient ID: Kimberly Mckay, female    DOB: 06-27-1980  Age: 43 y.o. MRN: [id number]  Chief Complaint  Patient presents with   Medical Management of Chronic Issues    HPI Discussed the use of AI scribe software for clinical note transcription with the patient, who gave verbal consent to proceed.  History of Present Illness Kimberly Mckay is a 43 year old female who presents for follow-up regarding her weight management treatment with Zepbound .  Pharmacologic weight management - Currently on second month of Zepbound  at a dose of 10 mg, with one injection remaining at this dose - Increased hunger by the end of the week, particularly on Thursdays - No significant medication side effects - Prefers to refill medication in 30-day increments to avoid insurance complications  Physical activity - Engages in frequent walking, facilitated by recent move to a neighborhood conducive to walking - Attends weekly gym sessions for weight training  Nutritional intake - Consumes three meals per day - Adequate protein intake  Weight loss progression - Has lost over 100 pounds, starting from over 300 pounds and currently weighing 198 pounds - Approaching two-year mark of weight loss at the end of December - Initial goal weight was 175 pounds; now considering a goal of 180-185 pounds  Peripheral edema - No swelling     ROS See HPI.    Objective:     BP (!) 108/52   Pulse 91   Ht 5' 7 (1.702 m)   Wt 198 lb (89.8 kg)   LMP  (LMP Unknown) Comment: Patient has been bleeding continously for months.  SpO2 99%   BMI 31.01 kg/m  BP Readings from Last 3 Encounters:  12/02/23 (!) 108/52  08/31/23 131/80  06/06/23 107/74   Wt Readings from Last 3 Encounters:  12/02/23 198 lb (89.8 kg)  08/31/23 207 lb 12.8 oz (94.3 kg)  06/06/23 209 lb (94.8 kg)      Physical Exam Constitutional:      Appearance: Normal  appearance.  HENT:     Head: Normocephalic.  Cardiovascular:     Rate and Rhythm: Normal rate and regular rhythm.  Pulmonary:     Breath sounds: Normal breath sounds.  Neurological:     General: No focal deficit present.     Mental Status: She is alert and oriented to person, place, and time.  Psychiatric:        Mood and Affect: Mood normal.      The 10-year ASCVD risk score (Arnett DK, et al., 2019) is: 0.4%    Assessment & Plan:  Launi Asencio was seen today for medical management of chronic issues.  Diagnoses and all orders for this visit:  Encounter for weight management  Class 1 obesity due to excess calories without serious comorbidity with body mass index (BMI) of 31.0 to 31.9 in adult -     tirzepatide  (ZEPBOUND ) 12.5 MG/0.5ML Pen; Inject 12.5 mg into the skin once a week.  Insulin  resistance  At risk for diabetes mellitus  At risk for impaired metabolic function   Assessment & Plan Obesity, class 1 Class 1 obesity with significant weight loss from over 300 pounds to 198 pounds. Considering Zepbound  dose increase due to hunger at end of dosing cycle. Goal weight 185 pounds. - Increase Zepbound  dose to 12.5 mg for three months. - Continue regular physical activity. - Monitor for Zepbound  side effects. - Set goal weight at 185 pounds.  Bassel Gaskill, PA-C

## 2023-12-29 ENCOUNTER — Ambulatory Visit

## 2024-01-03 ENCOUNTER — Ambulatory Visit: Admitting: Plastic Surgery

## 2024-01-09 ENCOUNTER — Encounter: Payer: Self-pay | Admitting: Plastic Surgery

## 2024-01-09 ENCOUNTER — Ambulatory Visit (INDEPENDENT_AMBULATORY_CARE_PROVIDER_SITE_OTHER): Admitting: Plastic Surgery

## 2024-01-09 VITALS — BP 126/87 | HR 64 | Ht 67.0 in | Wt 191.0 lb

## 2024-01-09 DIAGNOSIS — M546 Pain in thoracic spine: Secondary | ICD-10-CM

## 2024-01-09 DIAGNOSIS — M793 Panniculitis, unspecified: Secondary | ICD-10-CM

## 2024-01-09 DIAGNOSIS — R21 Rash and other nonspecific skin eruption: Secondary | ICD-10-CM

## 2024-01-09 DIAGNOSIS — M542 Cervicalgia: Secondary | ICD-10-CM | POA: Diagnosis not present

## 2024-01-09 DIAGNOSIS — G8929 Other chronic pain: Secondary | ICD-10-CM

## 2024-01-09 NOTE — Progress Notes (Signed)
   Subjective:    Patient ID: Kimberly Mckay, female    DOB: [birth year], 43 y.o.   MRN: [id number]  The patient is a 43 year old female here for reevaluation of her abdomen.  She complains of neck and back pain that has been persistent.  There was some improvement with her breast surgery but the skin breakdown in her skin folds has continued in her abdomen.  She has lost a significant amount of weight with her starting weight in the 300 range.  She is now 190 pounds and is 5 feet 7 inches tall.  She feels comfortable with her current weight.  She is not smoking.  She has had good results from her previous surgery.  She will upload some pictures of skin breakdown in her abdominal area.      Review of Systems  Constitutional:  Positive for activity change. Negative for appetite change.  Eyes: Negative.   Respiratory: Negative.    Cardiovascular: Negative.   Gastrointestinal: Negative.   Endocrine: Negative.   Genitourinary: Negative.   Musculoskeletal:  Positive for back pain and neck pain.  Skin:  Positive for rash.  Hematological: Negative.        Objective:   Physical Exam Vitals reviewed.  Constitutional:      Appearance: Normal appearance.  HENT:     Head: Atraumatic.  Cardiovascular:     Rate and Rhythm: Normal rate.     Pulses: Normal pulses.  Pulmonary:     Effort: Pulmonary effort is normal.  Musculoskeletal:        General: No swelling or deformity.  Skin:    General: Skin is warm.     Capillary Refill: Capillary refill takes less than 2 seconds.     Coloration: Skin is not jaundiced.     Findings: No bruising.  Neurological:     Mental Status: She is alert and oriented to person, place, and time.  Psychiatric:        Mood and Affect: Mood normal.        Behavior: Behavior normal.        Thought Content: Thought content normal.        Judgment: Judgment normal.         Assessment & Plan:     ICD-10-CM   1. Chronic bilateral thoracic back pain   M54.6    G89.29     2. Neck pain  M54.2     3. Panniculitis  M79.3       Patient is a good candidate for a panniculectomy with liposuction and add on abdominal upper portion excision.  I will also send her a quote.  Pictures were obtained of the patient and placed in the chart with the patient's or guardian's permission.

## 2024-03-07 ENCOUNTER — Ambulatory Visit

## 2024-03-07 ENCOUNTER — Ambulatory Visit: Admitting: Physician Assistant

## 2024-03-07 VITALS — BP 124/80 | HR 80

## 2024-03-07 DIAGNOSIS — R1013 Epigastric pain: Secondary | ICD-10-CM

## 2024-03-07 DIAGNOSIS — R6339 Other feeding difficulties: Secondary | ICD-10-CM | POA: Insufficient documentation

## 2024-03-07 DIAGNOSIS — R110 Nausea: Secondary | ICD-10-CM | POA: Diagnosis not present

## 2024-03-07 MED ORDER — OMEPRAZOLE 40 MG PO CPDR: DELAYED_RELEASE_CAPSULE | ORAL | 1 refills | Status: AC

## 2024-03-07 NOTE — Progress Notes (Signed)
 "  Acute Office Visit  Subjective:     Patient ID: Kimberly Mckay, female    DOB: [birth year], 44 y.o.   MRN: [id number]  Chief Complaint  Patient presents with   Abdominal Pain    HPI Discussed the use of AI scribe software for clinical note transcription with the patient, who gave verbal consent to proceed.  History of Present Illness Kimberly Mckay is a 44 year old female with a history of gastritis who presents with gnawing abdominal pain.  Abdominal pain and gastrointestinal symptoms - Gnawing abdominal pain since Friday in the epigastric area - History of similar symptoms six years ago, diagnosed as gastritis after endoscopy - Esophageal dysfunction noted previously (esophagus not shutting properly) - Morning nausea without vomiting - Abdominal tenderness and pressure - Symptoms worsened by food, especially fried foods - Constipation present - No black or tarry stools - Decreased oral intake since Thursday due to pain - Able to tolerate salmon and rice without discomfort last night - denies any melena or hematochezia   Gastroesophageal reflux and medication use - History of acid reflux, especially after discontinuing previous medication about a year ago - Previously took Prilosec 20 mg daily, increased to 40 mg daily on advice of a friend who is a PA - Obtained Pepcid AC this morning but has not yet taken it  Nonsteroidal anti-inflammatory drug (nsaid) use and dietary changes - History of frequent ibuprofen  use, now discontinued - Reduced soda intake  Gallbladder concerns - Mother expressed concern about gallbladder disease - No symptoms suggestive of gallbladder pathology reported  She is on tirizepitide for weight loss but has not had any issues other than constipation.   Upcoming surgery - Scheduled for surgery soon to remove excess skin from weight loss.     ROS See HPI.      Objective:    BP 124/80 (Cuff Size: Normal)   Pulse  80   LMP  (LMP Unknown) Comment: Patient has been bleeding continously for months.  SpO2 100%  BP Readings from Last 3 Encounters:  03/07/24 124/80  01/09/24 126/87  12/02/23 (!) 108/52   Wt Readings from Last 3 Encounters:  01/09/24 191 lb (86.6 kg)  12/02/23 198 lb (89.8 kg)  08/31/23 207 lb 12.8 oz (94.3 kg)      Physical Exam Constitutional:      Appearance: She is well-developed.  HENT:     Head: Normocephalic.  Cardiovascular:     Rate and Rhythm: Normal rate.  Pulmonary:     Effort: Pulmonary effort is normal.  Abdominal:     General: Bowel sounds are decreased. There is no distension.     Palpations: Abdomen is soft.     Tenderness: There is abdominal tenderness in the right upper quadrant and epigastric area. There is guarding. There is no right CVA tenderness, left CVA tenderness or rebound. Negative signs include Murphy's sign, Rovsing's sign, McBurney's sign and obturator sign.     Hernia: No hernia is present.     Comments: Epigastric guarding noted.   Neurological:     Mental Status: She is alert.          Assessment & Plan:  SABRASABRAKimaya Mckay was seen today for abdominal pain.  Diagnoses and all orders for this visit:  Epigastric pain -     CBC w/Diff/Platelet -     CMP14+EGFR -     Lipase -     omeprazole  (PRILOSEC) 40 MG capsule; Take one tablet  twice a day for 2 weeks then decrease to once a day. -     US  Abdomen Complete; Future  Nausea -     CBC w/Diff/Platelet -     CMP14+EGFR -     Lipase -     omeprazole  (PRILOSEC) 40 MG capsule; Take one tablet twice a day for 2 weeks then decrease to once a day. -     US  Abdomen Complete; Future  Pain provoked by eating -     CBC w/Diff/Platelet -     CMP14+EGFR -     Lipase -     omeprazole  (PRILOSEC) 40 MG capsule; Take one tablet twice a day for 2 weeks then decrease to once a day. -     US  Abdomen Complete; Future    Assessment & Plan Unclear etiology of symptoms but likely gastritis.  GLP-1 could be making symptoms worse.  No red flag symptoms today.  Recurrent gastritis with nausea and abdominal tenderness, likely exacerbated by ibuprofen  and soda. Differential includes gallbladder issues, but symptoms align with gastritis.  - CBC/CMP/Lipase to rule out pancreatitis, blood loss, liver enzyme inflammation.  - Ordered abdominal ultrasound for gallbladder and pancreas evaluation. - Prescribed omeprazole  40 mg BID for two weeks, then reduce to QD. - Advised Pepcid AC with meals. - Zofran  as needed for nausea.  - Ordered labs for pre-surgery stability. - Advised avoiding ibuprofen  and soda. - Recommended dietary modifications to avoid fried and spicy foods.    Return in about 4 weeks (around 04/04/2024).  Joie Hipps, PA-C   "

## 2024-03-07 NOTE — Patient Instructions (Signed)
 Get labs and ultrasound Increase omeprazole  to 40mg  twice a day for 2 weeks then decrease to once a day.  Start pepcid twice a day OTC.

## 2024-03-08 LAB — CBC WITH DIFFERENTIAL/PLATELET
Basophils Absolute: 0 x10E3/uL (ref 0.0–0.2)
Basos: 1 %
EOS (ABSOLUTE): 0.1 x10E3/uL (ref 0.0–0.4)
Eos: 2 %
Hematocrit: 42.1 % (ref 34.0–46.6)
Hemoglobin: 14.3 g/dL (ref 11.1–15.9)
Immature Grans (Abs): 0 x10E3/uL (ref 0.0–0.1)
Immature Granulocytes: 0 %
Lymphocytes Absolute: 1.9 x10E3/uL (ref 0.7–3.1)
Lymphs: 29 %
MCH: 28.6 pg (ref 26.6–33.0)
MCHC: 34 g/dL (ref 31.5–35.7)
MCV: 84 fL (ref 79–97)
Monocytes Absolute: 0.4 x10E3/uL (ref 0.1–0.9)
Monocytes: 6 %
Neutrophils Absolute: 4.1 x10E3/uL (ref 1.4–7.0)
Neutrophils: 61 %
Platelets: 198 x10E3/uL (ref 150–450)
RBC: 5 x10E6/uL (ref 3.77–5.28)
RDW: 13.4 % (ref 11.7–15.4)
WBC: 6.6 x10E3/uL (ref 3.4–10.8)

## 2024-03-08 LAB — CMP14+EGFR
ALT: 6 IU/L (ref 0–32)
AST: 16 IU/L (ref 0–40)
Albumin: 4 g/dL (ref 3.9–4.9)
Alkaline Phosphatase: 76 IU/L (ref 41–116)
BUN/Creatinine Ratio: 11 (ref 9–23)
BUN: 8 mg/dL (ref 6–24)
Bilirubin Total: 0.3 mg/dL (ref 0.0–1.2)
CO2: 20 mmol/L (ref 20–29)
Calcium: 9 mg/dL (ref 8.7–10.2)
Chloride: 106 mmol/L (ref 96–106)
Creatinine, Ser: 0.76 mg/dL (ref 0.57–1.00)
Globulin, Total: 2.8 g/dL (ref 1.5–4.5)
Glucose: 77 mg/dL (ref 70–99)
Potassium: 4.1 mmol/L (ref 3.5–5.2)
Sodium: 140 mmol/L (ref 134–144)
Total Protein: 6.8 g/dL (ref 6.0–8.5)
eGFR: 100 mL/min/1.73

## 2024-03-08 LAB — LIPASE: Lipase: 54 U/L (ref 14–72)

## 2024-03-09 ENCOUNTER — Ambulatory Visit: Payer: Self-pay | Admitting: Physician Assistant

## 2024-03-09 DIAGNOSIS — K8020 Calculus of gallbladder without cholecystitis without obstruction: Secondary | ICD-10-CM

## 2024-03-09 NOTE — Progress Notes (Signed)
 Kimberly Mckay,   WBC normal. No signs of infection.  Normal lipase.   Labs look good.

## 2024-03-09 NOTE — Progress Notes (Signed)
 Chrissy,   No acute gallbladder inflammation. You do have gallbladder stones which could be causing some of your GI issues but I suspect most recent GI issues are more due to stomach inflammation and not gallbladder.

## 2024-03-20 ENCOUNTER — Encounter: Payer: Self-pay | Admitting: Physician Assistant

## 2024-03-20 ENCOUNTER — Ambulatory Visit: Admitting: Physician Assistant

## 2024-03-20 VITALS — BP 114/75 | HR 99 | Temp 97.2°F | Ht 67.0 in | Wt 181.5 lb

## 2024-03-20 DIAGNOSIS — R6889 Other general symptoms and signs: Secondary | ICD-10-CM | POA: Diagnosis not present

## 2024-03-20 DIAGNOSIS — R051 Acute cough: Secondary | ICD-10-CM

## 2024-03-20 DIAGNOSIS — J101 Influenza due to other identified influenza virus with other respiratory manifestations: Secondary | ICD-10-CM

## 2024-03-20 LAB — POC SOFIA 2 FLU + SARS ANTIGEN FIA
Influenza A, POC: NEGATIVE
Influenza B, POC: POSITIVE — AB
SARS Coronavirus 2 Ag: NEGATIVE

## 2024-03-20 MED ORDER — HYDROCODONE BIT-HOMATROP MBR 5-1.5 MG/5ML PO SOLN: 5.0000 mL | Freq: Three times a day (TID) | ORAL | 0 refills | Status: DC | PRN

## 2024-03-20 MED ORDER — METHYLPREDNISOLONE 4 MG PO TBPK: ORAL_TABLET | ORAL | 0 refills | Status: AC

## 2024-03-20 NOTE — Patient Instructions (Addendum)
 Hycodan as needed for cough, it can make you sleepy.   Medrol  dose pack for inflammation. Ok to start flonase.   Influenza, Adult Influenza is also called the flu. It's an infection that affects your respiratory tract. This includes your nose, throat, windpipe, and lungs. The flu is contagious. This means it spreads easily from person to person. It causes symptoms that are like a cold. It can also cause a high fever and body aches. What are the causes? The flu is caused by the influenza virus. You can get it by: Breathing in droplets that are in the air after an infected person coughs or sneezes. Touching something that has the virus on it and then touching your mouth, nose, or eyes. What increases the risk? You may be more likely to get the flu if: You don't wash your hands often. You're near a lot of people during cold and flu season. You touch your mouth, eyes, or nose without washing your hands first. You don't get a flu shot each year. You may also be more at risk for the flu and serious problems, such as a lung infection called pneumonia, if: You're older than 65. You're pregnant. Your immune system is weak. Your immune system is your body's defense system. You have a long-term, or chronic, condition, such as: Heart, kidney, or lung disease. Diabetes. A liver disorder. Asthma. You're very overweight. You have anemia. This is when you don't have enough red blood cells in your body. What are the signs or symptoms? Flu symptoms often start all of a sudden. They may last 4-14 days and include: Fever and chills. Headaches, body aches, or muscle aches. Sore throat. Cough. Runny or stuffy nose. Discomfort in your chest. Not wanting to eat as much as normal. Feeling weak or tired. Feeling dizzy. Nausea or vomiting. How is this diagnosed? The flu may be diagnosed based on your symptoms and medical history. You may also have a physical exam. A swab may be taken from your nose or  throat and tested for the virus. How is this treated? If the flu is found early, you can be treated with antiviral medicine. This may be given to you by mouth or through an IV. It can help you feel less sick and get better faster. Taking care of yourself at home can also help your symptoms get better. Your health care provider may tell you to: Take over-the-counter medicines. Drink lots of fluids. The flu often goes away on its own. If you have very bad symptoms or problems caused by the flu, you may need to be treated in a hospital. Follow these instructions at home: Activity Rest as needed. Get lots of sleep. Stay home from work or school as told by your provider. Leave home only to go see your provider. Do not leave home for other reasons until you don't have a fever for 24 hours without taking medicine. Eating and drinking Take an oral rehydration solution (ORS). This is a drink that is sold at pharmacies and stores. Drink enough fluid to keep your pee pale yellow. Try to drink small amounts of clear fluids. These include water, ice chips, fruit juice mixed with water, and low-calorie sports drinks. Try to eat bland foods that are easy to digest. These include bananas, applesauce, rice, lean meats, toast, and crackers. Avoid drinks that have a lot of sugar or caffeine in them. These include energy drinks, regular sports drinks, and soda. Do not drink alcohol. Do not eat spicy  or fatty foods. General instructions     Take your medicines only as told by your provider. Use a cool mist humidifier to add moisture to the air in your home. This can make it easier for you to breathe. You should also clean the humidifier every day. To do so: Empty the water. Pour clean water in. Cover your mouth and nose when you cough or sneeze. Wash your hands with soap and water often and for at least 20 seconds. It's extra important to do so after you cough or sneeze. If you can't use soap and water, use  hand sanitizer. How is this prevented?  Get a flu shot every year. Ask your provider when you should get your flu shot. Stay away from people who are sick during fall and winter. Fall and winter are cold and flu season. Contact a health care provider if: You get new symptoms. You have chest pain. You have watery poop, also called diarrhea. You have a fever. Your cough gets worse. You start to have more mucus. You feel like you may vomit, or you vomit. Get help right away if: You become short of breath or have trouble breathing. Your skin or nails turn blue. You have very bad pain or stiffness in your neck. You get a sudden headache or pain in your face or ear. You vomit each time you eat or drink. These symptoms may be an emergency. Call 911 right away. Do not wait to see if the symptoms will go away. Do not drive yourself to the hospital. This information is not intended to replace advice given to you by your health care provider. Make sure you discuss any questions you have with your health care provider. Document Revised: 11/11/2022 Document Reviewed: 03/18/2022 Elsevier Patient Education  2024 [employer].

## 2024-03-20 NOTE — Progress Notes (Signed)
 "  Acute Office Visit  Subjective:     Patient ID: Kimberly Mckay, female    DOB: 1980-04-13, 44 y.o.   MRN: [id number]  Chief Complaint  Patient presents with   Medical Management of Chronic Issues    Cough fever body ache    HPI .Discussed the use of AI scribe software for clinical note transcription with the patient, who gave verbal consent to proceed.  History of Present Illness Kimberly Mckay is a 44 year old female who presents with facial pain, sore throat, and cough.  Facial pain and sinus pressure - Facial pain present since 04-13-2024, most pronounced in the morning - Maxillary and frontal sinus pressure - No colored nasal discharge; clear nasal drainage - Has used Tylenol  for pain relief - Has used Flonase nasal spray in the past with benefit for nasal passageways  Sore throat - Severe sore throat described as 'on fire' - Onset since April 13, 2024 - No prior similar episodes recently  Cough - Persistent cough since April 13, 2024 - Cough produces clear mucus without colored discharge - Has used Robitussin for symptom relief  General illness trajectory and exposures - Symptoms have worsened since onset - Has been mostly resting and sleeping - No known exposure to influenza or COVID-19 - No history of influenza or COVID-19 infection this year - Volunteers and is around many people     ROS See HPI.     Objective:    BP 114/75   Pulse 99   Temp (!) 97.2 F (36.2 C) (Oral)   Ht 5' 7 (1.702 m)   Wt 181 lb 8 oz (82.3 kg)   LMP  (LMP Unknown) Comment: Patient has been bleeding continously for months.  SpO2 100%   BMI 28.43 kg/m  BP Readings from Last 3 Encounters:  03/20/24 114/75  03/07/24 124/80  01/09/24 126/87   Wt Readings from Last 3 Encounters:  03/20/24 181 lb 8 oz (82.3 kg)  01/09/24 191 lb (86.6 kg)  12/02/23 198 lb (89.8 kg)    .SABRA Results for orders placed or performed in visit on 03/20/24  POC SOFIA 2 FLU + SARS  ANTIGEN FIA   Collection Time: 03/20/24 11:05 AM  Result Value Ref Range   Influenza A, POC Negative Negative   Influenza B, POC Positive (A) Negative   SARS Coronavirus 2 Ag Negative Negative     Physical Exam Constitutional:      Appearance: She is ill-appearing.  HENT:     Head: Normocephalic.     Right Ear: Tympanic membrane, ear canal and external ear normal. There is no impacted cerumen.     Left Ear: Tympanic membrane, ear canal and external ear normal. There is no impacted cerumen.     Nose: Nose normal.     Mouth/Throat:     Mouth: Mucous membranes are moist.     Pharynx: Posterior oropharyngeal erythema present. No oropharyngeal exudate.     Comments: Cobblestoning over oropharynx Eyes:     General:        Right eye: No discharge.        Left eye: No discharge.     Extraocular Movements: Extraocular movements intact.     Conjunctiva/sclera: Conjunctivae normal.     Pupils: Pupils are equal, round, and reactive to light.  Cardiovascular:     Rate and Rhythm: Normal rate and regular rhythm.  Pulmonary:     Effort: Pulmonary effort is normal.     Breath sounds: Normal breath  sounds. No wheezing or rhonchi.  Musculoskeletal:     Cervical back: Normal range of motion and neck supple. Tenderness present.     Right lower leg: No edema.     Left lower leg: No edema.  Lymphadenopathy:     Cervical: Cervical adenopathy present.  Neurological:     General: No focal deficit present.     Mental Status: She is oriented to person, place, and time.  Psychiatric:        Mood and Affect: Mood normal.          Assessment & Plan:  .Kimberly Mckay was seen today for medical management of chronic issues.  Diagnoses and all orders for this visit:  Influenza B -     POC SOFIA 2 FLU + SARS ANTIGEN FIA -     methylPREDNISolone  (MEDROL  DOSEPAK) 4 MG TBPK tablet; Take as directed by package insert. -     HYDROcodone  bit-homatropine (HYCODAN) 5-1.5 MG/5ML syrup; Take 5 mLs  by mouth every 8 (eight) hours as needed for cough.  Acute cough -     POC SOFIA 2 FLU + SARS ANTIGEN FIA -     methylPREDNISolone  (MEDROL  DOSEPAK) 4 MG TBPK tablet; Take as directed by package insert. -     HYDROcodone  bit-homatropine (HYCODAN) 5-1.5 MG/5ML syrup; Take 5 mLs by mouth every 8 (eight) hours as needed for cough.  Flu-like symptoms -     POC SOFIA 2 FLU + SARS ANTIGEN FIA -     methylPREDNISolone  (MEDROL  DOSEPAK) 4 MG TBPK tablet; Take as directed by package insert. -     HYDROcodone  bit-homatropine (HYCODAN) 5-1.5 MG/5ML syrup; Take 5 mLs by mouth every 8 (eight) hours as needed for cough.    Assessment & Plan Influenza B with viral sinusitis Out of window for anti-viral  Discussed potential for secondary bacterial infection if symptoms persist beyond a week or if colored nasal discharge develops. - Prescribed cough syrup with hydrocodone  for symptomatic relief and to aid sleep. - Recommended Flonase nasal spray to keep nasal passageways open. - Advised use of NyQuil or DayQuil for symptomatic relief. - Prescribed a short course of steroids to reduce inflammation. - Instructed to monitor for fever and remain home until fever-free for 24 hours. - Advised to report if symptoms persist beyond a week or if colored nasal discharge develops, as this may indicate a secondary bacterial infection requiring antibiotics.    Return if symptoms worsen or fail to improve.  Elmire Amrein, PA-C   "

## 2024-03-21 ENCOUNTER — Ambulatory Visit: Admitting: Student

## 2024-03-21 VITALS — BP 109/74 | HR 103 | Ht 67.0 in | Wt 180.0 lb

## 2024-03-21 DIAGNOSIS — M793 Panniculitis, unspecified: Secondary | ICD-10-CM | POA: Diagnosis not present

## 2024-03-21 MED ORDER — CEPHALEXIN 500 MG PO CAPS: 500.0000 mg | ORAL_CAPSULE | Freq: Four times a day (QID) | ORAL | 0 refills | Status: AC

## 2024-03-21 MED ORDER — OXYCODONE HCL 5 MG PO TABS: 5.0000 mg | ORAL_TABLET | Freq: Three times a day (TID) | ORAL | 0 refills | Status: AC | PRN

## 2024-03-21 MED ORDER — ONDANSETRON HCL 4 MG PO TABS: 4.0000 mg | ORAL_TABLET | Freq: Three times a day (TID) | ORAL | 0 refills | Status: AC | PRN

## 2024-03-21 NOTE — Progress Notes (Cosign Needed)
 "    Patient ID: Kimberly Mckay, female    DOB: [birth year], 44 y.o.   MRN: [id number]  Chief Complaint  Patient presents with   Pre-op Exam      ICD-10-CM   1. Panniculitis  M79.3        History of Present Illness: Kimberly Mckay is a 44 y.o.  female  with a history of panniculitis.  She presents for preoperative evaluation for upcoming procedure, panniculectomy, scheduled for 04/11/2024 with Dr. Lowery.  The patient has had problems with anesthesia.  Patient reports that she has had postoperative nausea and vomiting in the past.  Otherwise denies any issues with anesthesia.  She denies any history of cardiac disease.  She denies taking any blood thinners.  Patient reports she is not a smoker.  Patient denies taking any birth control or hormone replacement.  She denies any history of greater than 3 miscarriages.  She denies any personal family history of blood clots or clotting diseases.  She denies any recent surgeries, traumas or infections.  She denies any history of stroke or heart attack.  She denies any history of chron's disease or ulcerative colitis.  She denies any history of COPD.  She does report she has asthma, but this is well-controlled and she has not had any attacks recently.  She denies any varicosities to her lower extremities.  Patient does report she had the flu about a week and a half ago, but this is resolved.  She denies any other recent fevers, chills or changes in her health.  Per chart review, patient has history of anemia.  Her most recent hemoglobin was 14.3 on 03/07/2024.  Summary of Previous Visit: Patient was seen for evaluation of her abdomen by Dr. Lowery on 06/06/2023.  At this visit, patient complained of neck and back pain as well as dryness and skin irritation at her skin folds.  Patient reported that she had lost about 100 pounds.  Patient at this time was planning on losing another 20-30 pounds.  Patient was found to be a good candidate  for panniculectomy.  It was encouraged that she continue with her weight loss.  Patient was then seen again on 01/09/2024.  At this visit, patient reported she felt comfortable with her weight.  She was found to be a good candidate for panniculectomy.  Job: Runs a teaching laboratory technician, works at [employer] job, planning to take the day of and the day after surgery off.  Discussed with the patient she will restrictions for 6 weeks postoperatively.  She expressed understanding.  PMH Significant for: Panniculitis, anemia, GAD   Past Medical History: Allergies: Allergies[1]  Current Medications: Current Medications[2]  Past Medical Problems: Past Medical History:  Diagnosis Date   Anemia 2024   due to heavy periods   Anxiety    takes xanax  prn, follows w/ PCP   Asthma    Hasn't had an asthma exacerbation in many years per pt on 03/24/22.   Back pain    Complication of anesthesia    pt aspirated following upper GI   COVID-19 04/2019   Depression    Eczema    Fibromyalgia    GERD (gastroesophageal reflux disease)    takes Prevacid   PONV (postoperative nausea and vomiting)    Swelling of both lower extremities    Vitamin D  deficiency    taking Vitamin D  supplements   Wears contact lenses    Wears glasses     Past Surgical History:  Past Surgical History:  Procedure Laterality Date   BREAST CYST EXCISION Bilateral 01/29/2021   Procedure: Excision of accessory axillary tissue;  Surgeon: Lowery Estefana RAMAN, DO;  Location: [facility];  Service: Plastics;  Laterality: Bilateral;  1.5 hours   BREAST REDUCTION SURGERY Bilateral 09/03/2020   Procedure: BILATERAL BREAST REDUCTION WITH LIPOSUCTION;  Surgeon: Lowery Estefana RAMAN, DO;  Location: [facility];  Service: Plastics;  Laterality: Bilateral;  4 hours total   CYSTOSCOPY N/A 04/07/2022   Procedure: CYSTOSCOPY;  Surgeon: Erik Kieth BROCKS, MD;  Location: [facility];  Service: Gynecology;   Laterality: N/A;   TOTAL LAPAROSCOPIC HYSTERECTOMY WITH SALPINGECTOMY Bilateral 04/07/2022   Procedure: TOTAL LAPAROSCOPIC HYSTERECTOMY WITH SALPINGECTOMY;  Surgeon: Erik Kieth BROCKS, MD;  Location: [facility];  Service: Gynecology;  Laterality: Bilateral;   UPPER GI ENDOSCOPY     pt states she aspirated with this procedure   WISDOM TOOTH EXTRACTION      Social History: Social History   Socioeconomic History   Marital status: Married    Spouse name: Alaia Lordi   Number of children: 2   Years of education: Not on file   Highest education level: 12th grade  Occupational History   Occupation: Occupational Psychologist  Tobacco Use   Smoking status: Former    Current packs/day: 0.00    Average packs/day: 0.5 packs/day for 20.0 years (10.0 ttl pk-yrs)    Types: Cigarettes    Start date: 1999    Quit date: 2019    Years since quitting: 7.0   Smokeless tobacco: Never  Vaping Use   Vaping status: Never Used  Substance and Sexual Activity   Alcohol use: Not Currently   Drug use: No   Sexual activity: Not on file  Other Topics Concern   Not on file  Social History Narrative   Not on file   Social Drivers of Health   Tobacco Use: Medium Risk (03/20/2024)   Patient History    Smoking Tobacco Use: Former    Smokeless Tobacco Use: Never    Passive Exposure: Not on file  Financial Resource Strain: Low Risk (03/07/2024)   Overall Financial Resource Strain (CARDIA)    Difficulty of Paying Living Expenses: Not hard at all  Food Insecurity: No Food Insecurity (03/07/2024)   Epic    Worried About Programme Researcher, Broadcasting/film/video in the Last Year: Never true    Ran Out of Food in the Last Year: Never true  Transportation Needs: No Transportation Needs (03/07/2024)   Epic    Lack of Transportation (Medical): No    Lack of Transportation (Non-Medical): No  Physical Activity: Sufficiently Active (03/07/2024)   Exercise Vital Sign    Days of Exercise per Week: 5 days     Minutes of Exercise per Session: 40 min  Stress: Stress Concern Present (03/07/2024)   [employer] of Occupational Health - Occupational Stress Questionnaire    Feeling of Stress: To some extent  Social Connections: Moderately Integrated (03/07/2024)   Social Connection and Isolation Panel    Frequency of Communication with Friends and Family: More than three times a week    Frequency of Social Gatherings with Friends and Family: More than three times a week    Attends Religious Services: More than 4 times per year    Active Member of [employer] or Organizations: No    Attends Banker Meetings: Not on file    Marital Status: Married  Catering Manager  Violence: Not At Risk (08/18/2022)   [facility]   HITS    Over the last 12 months how often did your partner physically hurt you?: Never    Over the last 12 months how often did your partner insult you or talk down to you?: Never    Over the last 12 months how often did your partner threaten you with physical harm?: Never    Over the last 12 months how often did your partner scream or curse at you?: Never  Depression (PHQ2-9): Low Risk (03/20/2024)   Depression (PHQ2-9)    PHQ-2 Score: 0  Alcohol Screen: Not on file  Housing: Low Risk (03/07/2024)   Epic    Unable to Pay for Housing in the Last Year: No    Number of Times Moved in the Last Year: 1    Homeless in the Last Year: No  Utilities: Not At Risk (08/18/2022)   Received [facility]    Threatened with loss of utilities: No  Health Literacy: Not on file    Family History: Family History  Problem Relation Age of Onset   Diabetes Mother    Heart disease Mother    Cancer Mother    Thyroid  disease Mother    Obesity Mother    Hypertension Father    Hyperlipidemia Father    Alcoholism Father     Review of Systems: Does report flu 1.5 weeks ago.  Otherwise denies any recent fevers, chills or changes in her health  Physical  Exam: Vital Signs BP 109/74 (BP Location: Left Wrist, Patient Position: Sitting, Cuff Size: Small)   Pulse (!) 103   Ht 5' 7 (1.702 m)   Wt 180 lb (81.6 kg)   LMP  (LMP Unknown) Comment: Patient has been bleeding continously for months.  SpO2 100%   BMI 28.19 kg/m   Physical Exam  Constitutional:      General: Not in acute distress.    Appearance: Normal appearance. Not ill-appearing.  HENT:     Head: Normocephalic and atraumatic.  Eyes:     Pupils: Pupils are equal, round Cardiovascular:     Rate and Rhythm: Slightly elevated Pulmonary:     Effort: Pulmonary effort is normal. No respiratory distress.  Skin:    Findings: No erythema or rash.  Neurological:     Mental Status: Alert and oriented to person, place, and time. Mental status is at baseline.  Psychiatric:        Mood and Affect: Mood normal.        Behavior: Behavior normal.    Assessment/Plan: The patient is scheduled for panniculectomy with Dr. Lowery.  Risks, benefits, and alternatives of procedure discussed, questions answered and consent obtained.    Smoking Status: Non-smoker; Counseling Given?  N/A  Caprini Score: 4; Risk Factors include: Age, BMI > 25, and length of planned surgery. Recommendation for mechanical prophylaxis. Encourage early ambulation.   Pictures obtained: @consult   Post-op Rx sent to pharmacy: Oxycodone , Zofran , Keflex   Instructed the patient to hold any multivitamins, vitamins or supplements at least 1 week prior to surgery.  I did discuss with her that she may continue taking her iron.  Discussed with her to not take Xanax  at the same time as a pain medication as these can be sedating.  Also discussed with the patient to hold her Zepbound  at least 1 week prior to surgery.  Patient expressed understanding.  Patient was provided with the General Surgical  Risk consent document and Pain Medication Agreement prior to their appointment.  They had adequate time to read through the risk  consent documents and Pain Medication Agreement. We also discussed them in person together during this preop appointment. All of their questions were answered to their satisfaction.  Recommended calling if they have any further questions.  Risk consent form and Pain Medication Agreement to be scanned into patient's chart.  We discussed the difference between panniculectomy and abdominoplasty.  We discussed that with panniculectomy, the upper abdomen above the umbilicus is not addressed.  She expressed understanding.  The consent was obtained with risks and complications reviewed which included bleeding, pain, scar, infection and the risk of anesthesia.  The patients questions were answered to the patients expressed satisfaction.    Electronically signed by: Estefana FORBES Peck, PA-C 03/21/2024 9:11 PM      [1]  Allergies Allergen Reactions   Aspirin Hives   Butorphanol Other (See Comments)    Other reaction(s): Psychosis (intolerance) Hallucinations    Propoxyphene Hives   Sulfa Antibiotics Nausea And Vomiting   Meperidine Rash    Hives    [2]  Current Outpatient Medications:    ALPRAZolam  (XANAX ) 0.5 MG tablet, Take 0.5 mg by mouth as needed for anxiety., Disp: , Rfl:    cephALEXin  (KEFLEX ) 500 MG capsule, Take 1 capsule (500 mg total) by mouth 4 (four) times daily for 3 days., Disp: 12 capsule, Rfl: 0   omeprazole  (PRILOSEC) 40 MG capsule, Take one tablet twice a day for 2 weeks then decrease to once a day., Disp: 90 capsule, Rfl: 1   ondansetron  (ZOFRAN ) 4 MG tablet, Take 1 tablet (4 mg total) by mouth every 8 (eight) hours as needed for up to 15 doses for nausea or vomiting., Disp: 15 tablet, Rfl: 0   oxyCODONE  (ROXICODONE ) 5 MG immediate release tablet, Take 1 tablet (5 mg total) by mouth every 8 (eight) hours as needed for up to 15 doses for severe pain (pain score 7-10)., Disp: 15 tablet, Rfl: 0   senna-docusate (SENOKOT-S) 8.6-50 MG tablet, Take 1 tablet by mouth at bedtime as  needed for mild constipation., Disp: 30 tablet, Rfl: 0   tirzepatide  (ZEPBOUND ) 12.5 MG/0.5ML Pen, Inject 12.5 mg into the skin once a week., Disp: 2 mL, Rfl: 5   acetaminophen  (TYLENOL ) 500 MG tablet, Take 2 tablets (1,000 mg total) by mouth every 8 (eight) hours. (Patient not taking: Reported on 03/21/2024), Disp: 60 tablet, Rfl: 0   albuterol (VENTOLIN HFA) 108 (90 Base) MCG/ACT inhaler, Inhale into the lungs. (Patient not taking: Reported on 03/21/2024), Disp: , Rfl:    ferrous sulfate  325 (65 FE) MG EC tablet, Take 1 tablet (325 mg total) by mouth daily with breakfast. (Patient not taking: Reported on 03/21/2024), Disp: 90 tablet, Rfl: 1   methylPREDNISolone  (MEDROL  DOSEPAK) 4 MG TBPK tablet, Take as directed by package insert., Disp: 21 tablet, Rfl: 0   Vitamin D , Ergocalciferol , (DRISDOL ) 1.25 MG (50000 UNIT) CAPS capsule, Take 1 capsule (50,000 Units total) by mouth every 7 (seven) days. (Patient not taking: Reported on 03/21/2024), Disp: 12 capsule, Rfl: 1  "

## 2024-03-25 ENCOUNTER — Encounter: Payer: Self-pay | Admitting: Physician Assistant

## 2024-03-26 MED ORDER — POLYMYXIN B-TRIMETHOPRIM 10000-0.1 UNIT/ML-% OP SOLN: 1.0000 [drp] | OPHTHALMIC | 0 refills | Status: AC

## 2024-04-11 ENCOUNTER — Encounter (HOSPITAL_BASED_OUTPATIENT_CLINIC_OR_DEPARTMENT_OTHER): Payer: Self-pay

## 2024-04-11 ENCOUNTER — Ambulatory Visit (HOSPITAL_BASED_OUTPATIENT_CLINIC_OR_DEPARTMENT_OTHER): Admit: 2024-04-11 | Admitting: Plastic Surgery

## 2024-04-11 SURGERY — PANNICULECTOMY
Anesthesia: Choice | Site: Abdomen | Laterality: Bilateral

## 2024-04-19 ENCOUNTER — Encounter: Admitting: Student

## 2024-05-01 ENCOUNTER — Encounter: Admitting: Plastic Surgery

## 2024-05-18 ENCOUNTER — Encounter: Admitting: Plastic Surgery

## 2024-06-01 ENCOUNTER — Ambulatory Visit: Admitting: Physician Assistant
